# Patient Record
Sex: Female | Born: 1940
Health system: Southern US, Community
[De-identification: ages and names within clinical notes are randomized; demographics above are authoritative.]

## PROBLEM LIST (undated history)

## (undated) DIAGNOSIS — K219 Gastro-esophageal reflux disease without esophagitis: Secondary | ICD-10-CM

## (undated) DIAGNOSIS — G43909 Migraine, unspecified, not intractable, without status migrainosus: Secondary | ICD-10-CM

## (undated) DIAGNOSIS — I10 Essential (primary) hypertension: Secondary | ICD-10-CM

## (undated) DIAGNOSIS — Z8489 Family history of other specified conditions: Secondary | ICD-10-CM

## (undated) DIAGNOSIS — G4700 Insomnia, unspecified: Secondary | ICD-10-CM

## (undated) DIAGNOSIS — R0600 Dyspnea, unspecified: Secondary | ICD-10-CM

## (undated) DIAGNOSIS — I2510 Atherosclerotic heart disease of native coronary artery without angina pectoris: Secondary | ICD-10-CM

## (undated) DIAGNOSIS — R569 Unspecified convulsions: Secondary | ICD-10-CM

## (undated) DIAGNOSIS — R51 Headache: Secondary | ICD-10-CM

## (undated) DIAGNOSIS — G8929 Other chronic pain: Secondary | ICD-10-CM

## (undated) DIAGNOSIS — E785 Hyperlipidemia, unspecified: Secondary | ICD-10-CM

## (undated) DIAGNOSIS — R200 Anesthesia of skin: Secondary | ICD-10-CM

## (undated) DIAGNOSIS — I639 Cerebral infarction, unspecified: Secondary | ICD-10-CM

## (undated) DIAGNOSIS — R202 Paresthesia of skin: Secondary | ICD-10-CM

## (undated) DIAGNOSIS — G459 Transient cerebral ischemic attack, unspecified: Secondary | ICD-10-CM

## (undated) DIAGNOSIS — M1990 Unspecified osteoarthritis, unspecified site: Secondary | ICD-10-CM

## (undated) DIAGNOSIS — M549 Dorsalgia, unspecified: Secondary | ICD-10-CM

## (undated) DIAGNOSIS — I219 Acute myocardial infarction, unspecified: Secondary | ICD-10-CM

## (undated) DIAGNOSIS — E119 Type 2 diabetes mellitus without complications: Secondary | ICD-10-CM

## (undated) DIAGNOSIS — K449 Diaphragmatic hernia without obstruction or gangrene: Secondary | ICD-10-CM

## (undated) HISTORY — DX: Type 2 diabetes mellitus without complications: E11.9

## (undated) HISTORY — DX: Other chronic pain: G89.29

## (undated) HISTORY — PX: TOTAL ABDOMINAL HYSTERECTOMY: SHX209

## (undated) HISTORY — DX: Headache: R51

## (undated) HISTORY — DX: Dorsalgia, unspecified: M54.9

## (undated) HISTORY — DX: Acute myocardial infarction, unspecified: I21.9

## (undated) HISTORY — PX: CARDIAC CATHETERIZATION: SHX172

## (undated) HISTORY — DX: Transient cerebral ischemic attack, unspecified: G45.9

## (undated) HISTORY — DX: Insomnia, unspecified: G47.00

## (undated) HISTORY — PX: ANTERIOR AND POSTERIOR VAGINAL REPAIR: SUR5

## (undated) HISTORY — DX: Diaphragmatic hernia without obstruction or gangrene: K44.9

## (undated) HISTORY — DX: Hyperlipidemia, unspecified: E78.5

## (undated) HISTORY — DX: Migraine, unspecified, not intractable, without status migrainosus: G43.909

## (undated) HISTORY — PX: SQUAMOUS CELL CARCINOMA EXCISION: SHX2433

## (undated) HISTORY — PX: TONSILLECTOMY: SUR1361

## (undated) HISTORY — DX: Essential (primary) hypertension: I10

---
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
## 1981-07-13 HISTORY — PX: OTHER SURGICAL HISTORY: SHX169

## 1982-07-13 HISTORY — PX: OTHER SURGICAL HISTORY: SHX169

## 2001-06-25 ENCOUNTER — Emergency Department (HOSPITAL_COMMUNITY): Admission: EM | Admit: 2001-06-25 | Discharge: 2001-06-25 | Payer: Self-pay | Admitting: Emergency Medicine

## 2001-06-25 ENCOUNTER — Encounter: Payer: Self-pay | Admitting: Family Medicine

## 2003-07-14 DIAGNOSIS — I219 Acute myocardial infarction, unspecified: Secondary | ICD-10-CM

## 2003-07-14 HISTORY — DX: Acute myocardial infarction, unspecified: I21.9

## 2003-07-27 ENCOUNTER — Inpatient Hospital Stay (HOSPITAL_COMMUNITY): Admission: AD | Admit: 2003-07-27 | Discharge: 2003-07-31 | Payer: Self-pay | Admitting: Internal Medicine

## 2003-10-24 ENCOUNTER — Inpatient Hospital Stay (HOSPITAL_COMMUNITY): Admission: EM | Admit: 2003-10-24 | Discharge: 2003-10-26 | Payer: Self-pay | Admitting: Emergency Medicine

## 2004-02-15 ENCOUNTER — Inpatient Hospital Stay (HOSPITAL_COMMUNITY): Admission: AD | Admit: 2004-02-15 | Discharge: 2004-02-18 | Payer: Self-pay | Admitting: Cardiology

## 2004-05-01 ENCOUNTER — Ambulatory Visit (HOSPITAL_COMMUNITY): Admission: RE | Admit: 2004-05-01 | Discharge: 2004-05-01 | Payer: Self-pay | Admitting: Gastroenterology

## 2004-05-19 ENCOUNTER — Encounter: Admission: RE | Admit: 2004-05-19 | Discharge: 2004-05-19 | Payer: Self-pay | Admitting: Family Medicine

## 2004-06-11 ENCOUNTER — Ambulatory Visit: Payer: Self-pay | Admitting: Gastroenterology

## 2004-07-26 ENCOUNTER — Observation Stay (HOSPITAL_COMMUNITY): Admission: EM | Admit: 2004-07-26 | Discharge: 2004-07-28 | Payer: Self-pay | Admitting: Internal Medicine

## 2004-07-26 ENCOUNTER — Ambulatory Visit: Payer: Self-pay | Admitting: Cardiology

## 2004-07-28 ENCOUNTER — Ambulatory Visit: Payer: Self-pay

## 2004-08-14 ENCOUNTER — Ambulatory Visit: Payer: Self-pay | Admitting: Cardiology

## 2004-11-21 ENCOUNTER — Ambulatory Visit: Payer: Self-pay | Admitting: Cardiology

## 2004-11-27 ENCOUNTER — Ambulatory Visit: Payer: Self-pay | Admitting: Cardiology

## 2005-02-09 ENCOUNTER — Inpatient Hospital Stay (HOSPITAL_COMMUNITY): Admission: EM | Admit: 2005-02-09 | Discharge: 2005-02-12 | Payer: Self-pay | Admitting: Emergency Medicine

## 2005-02-09 ENCOUNTER — Ambulatory Visit: Payer: Self-pay | Admitting: Cardiovascular Disease

## 2005-02-11 ENCOUNTER — Ambulatory Visit: Payer: Self-pay | Admitting: Gastroenterology

## 2005-02-16 ENCOUNTER — Ambulatory Visit: Payer: Self-pay

## 2005-02-18 ENCOUNTER — Ambulatory Visit: Payer: Self-pay | Admitting: Cardiology

## 2005-02-20 ENCOUNTER — Ambulatory Visit: Payer: Self-pay | Admitting: Internal Medicine

## 2005-03-05 ENCOUNTER — Ambulatory Visit (HOSPITAL_COMMUNITY): Admission: RE | Admit: 2005-03-05 | Discharge: 2005-03-05 | Payer: Self-pay | Admitting: Gastroenterology

## 2005-03-11 ENCOUNTER — Ambulatory Visit: Payer: Self-pay | Admitting: Cardiology

## 2005-09-18 ENCOUNTER — Ambulatory Visit: Payer: Self-pay | Admitting: Cardiology

## 2005-09-18 ENCOUNTER — Inpatient Hospital Stay (HOSPITAL_COMMUNITY): Admission: EM | Admit: 2005-09-18 | Discharge: 2005-09-21 | Payer: Self-pay | Admitting: Emergency Medicine

## 2005-10-28 ENCOUNTER — Ambulatory Visit: Payer: Self-pay | Admitting: Cardiology

## 2006-08-22 ENCOUNTER — Ambulatory Visit: Payer: Self-pay | Admitting: Cardiology

## 2006-08-22 HISTORY — PX: CARDIAC CATHETERIZATION: SHX172

## 2006-08-23 ENCOUNTER — Inpatient Hospital Stay (HOSPITAL_COMMUNITY): Admission: EM | Admit: 2006-08-23 | Discharge: 2006-08-24 | Payer: Self-pay | Admitting: Emergency Medicine

## 2006-09-15 ENCOUNTER — Ambulatory Visit: Payer: Self-pay | Admitting: Cardiology

## 2007-03-16 ENCOUNTER — Ambulatory Visit: Payer: Self-pay | Admitting: Cardiology

## 2007-06-08 ENCOUNTER — Encounter: Admission: RE | Admit: 2007-06-08 | Discharge: 2007-06-08 | Payer: Self-pay | Admitting: Internal Medicine

## 2007-06-17 ENCOUNTER — Ambulatory Visit: Payer: Self-pay | Admitting: Cardiology

## 2007-06-22 ENCOUNTER — Ambulatory Visit: Payer: Self-pay | Admitting: Cardiology

## 2007-06-27 ENCOUNTER — Ambulatory Visit: Payer: Self-pay | Admitting: Cardiology

## 2008-12-03 HISTORY — PX: CYSTOCELE REPAIR: SHX163

## 2010-07-30 LAB — CBC
HCT: 40.1 % (ref 36.0–46.0)
Hemoglobin: 13.5 g/dL (ref 12.0–15.0)
MCH: 30.5 pg (ref 26.0–34.0)
MCHC: 33.7 g/dL (ref 30.0–36.0)
MCV: 90.7 fL (ref 78.0–100.0)
Platelets: 184 10*3/uL (ref 150–400)
RBC: 4.42 MIL/uL (ref 3.87–5.11)
RDW: 12.6 % (ref 11.5–15.5)
WBC: 4.6 10*3/uL (ref 4.0–10.5)

## 2010-07-30 LAB — BASIC METABOLIC PANEL
BUN: 11 mg/dL (ref 6–23)
CO2: 24 mEq/L (ref 19–32)
Calcium: 9.4 mg/dL (ref 8.4–10.5)
Chloride: 104 mEq/L (ref 96–112)
Creatinine, Ser: 0.8 mg/dL (ref 0.4–1.2)
GFR calc Af Amer: >60 mL/min (ref >60)
GFR calc non Af Amer: >60 mL/min (ref >60)
Glucose, Bld: 173 mg/dL — ABNORMAL HIGH (ref 70–99)
Potassium: 4.5 mEq/L (ref 3.5–5.1)
Sodium: 138 mEq/L (ref 135–145)

## 2010-07-30 LAB — SURGICAL PCR SCREEN
MRSA, PCR: NEGATIVE
Staphylococcus aureus: NEGATIVE

## 2010-07-31 ENCOUNTER — Inpatient Hospital Stay (HOSPITAL_COMMUNITY)
Admission: RE | Admit: 2010-07-31 | Discharge: 2010-07-31 | Payer: Self-pay | Source: Home / Self Care | Attending: Neurosurgery | Admitting: Neurosurgery

## 2010-07-31 HISTORY — PX: BACK SURGERY: SHX140

## 2010-08-03 ENCOUNTER — Encounter: Payer: Self-pay | Admitting: Gastroenterology

## 2010-08-04 LAB — GLUCOSE, CAPILLARY
Glucose-Capillary: 149 mg/dL — ABNORMAL HIGH (ref 70–99)
Glucose-Capillary: 154 mg/dL — ABNORMAL HIGH (ref 70–99)
Glucose-Capillary: 198 mg/dL — ABNORMAL HIGH (ref 70–99)
Glucose-Capillary: 210 mg/dL — ABNORMAL HIGH (ref 70–99)

## 2010-08-07 NOTE — Op Note (Addendum)
[name], [name]              ACCOUNT NO.:  [id number]  MEDICAL RECORD NO.:  [id number]          PATIENT TYPE:  INP  LOCATION:  2899                         FACILITY:  [facility]  PHYSICIAN:  Danae Orleans. Venetia Maxon, M.D.  DATE OF BIRTH:  1940-08-26  DATE OF PROCEDURE:  07/31/2010 DATE OF DISCHARGE:                              OPERATIVE REPORT   PREOPERATIVE DIAGNOSIS:  Left L5-S1 herniated lumbar disk with spondylosis, degenerative disk disease, and radiculopathy.  POSTOPERATIVE DIAGNOSIS:  Left L5-S1 herniated lumbar disk with spondylosis, degenerative disk disease, and radiculopathy.  PROCEDURE:  Left L5-S1 microdiskectomy with microdissection.  SURGEON:  Danae Orleans. Venetia Maxon, MD  ASSISTANTS: 1. Georgiann Cocker, RN 2. Clydene Fake, MD  ANESTHESIA:  General endotracheal anesthesia.  ESTIMATED BLOOD LOSS:  Minimal.  COMPLICATIONS:  None.  DISPOSITION:  Recovery.  INDICATIONS:  [name] is a 70 year old woman with severe left leg pain.  She has a large disk herniation at L5-S1 on the left.  It was elected to take her to surgery for microdiskectomy.  PROCEDURE IN DETAIL:  [name] was brought to the operating room. Following satisfactory and uncomplicated induction of general endotracheal anesthesia and placement of intravenous lines, the patient was placed in a prone position on the Wilson frame.  Her low back was prepped and draped in the usual sterile fashion.  The area of planned incision was infiltrated with local lidocaine.  Incision was made in the midline overlying the L5-S1 interspace, carried to the lumbodorsal fascia which was incised sharply on the left side of the midline. Subperiosteal dissection was performed exposing the L5-S1 interlaminar space.  A marker probe was placed and intraoperative x-ray confirmed correct orientation.  A hemi-semi-laminectomy of L5 was performed with high-speed drill and completed with Kerrison rongeurs and a  foraminotomy overlying the superior aspect of the sacrum to decompress the left S1 nerve root was also performed.  The microscope was brought into field and the ligamentum flavum was detached and removed in a piecemeal fashion.  The S1 nerve root was under significant pressure because of a large spondylitic disk herniation with a free fragment overlying that. Using careful microdissection technique, the S1 nerve root was mobilized medially.  The fragment of herniated disk material was removed.  The spondylitic ridge was also taken down.  The interspace was cleared of residual disk material as both medially and laterally and it was felt at this point that significant decompression of the S1 nerve root had been achieved.  There was no evidence of any compression of the L5 nerve root.  Hemostasis was assured.  The wound was irrigated, bathed in Depo- Medrol and fentanyl.  The self-retaining retractor was removed.  The lumbodorsal fascia was closed with 0 Vicryl sutures, subcutaneous tissues were reapproximated with 2-0 Vicryl interrupted inverted sutures, and skin edges were reapproximated with 3-0 Vicryl subcuticular stitch.  The wound was dressed with Dermabond.  The patient was extubated in the operating room and taken to the recovery in stable and satisfactory condition having tolerated the operation well.  Counts were correct at the end of the case.     Danae Orleans. Venetia Maxon,  M.D.     JDS/MEDQ  D:  07/31/2010  T:  07/31/2010  Job:  [id number]  Electronically Signed by Maeola Harman M.D. on 08/07/2010 10:14:13 AM

## 2010-09-02 NOTE — Discharge Summary (Signed)
  [name], HUSBAND              ACCOUNT NO.:  [id number]  MEDICAL RECORD NO.:  [id number]          PATIENT TYPE:  INP  LOCATION:  3536                         FACILITY:  [facility]  PHYSICIAN:  Danae Orleans. Venetia Maxon, M.D.  DATE OF BIRTH:  1941/01/12  DATE OF ADMISSION:  07/31/2010 DATE OF DISCHARGE:  07/31/2010                              DISCHARGE SUMMARY   REASON FOR ADMISSION:  [name] is a 70 year old woman with severe left leg pain.  She has a large disk herniation at L5-S1 of the left. It was elected to take her to surgery for left L5-S1 microdiskectomy. Postoperatively, the patient did well and was mobilizing without difficulty and was discharge home later on August 31, 2010, and having tolerated hospitalization and surgery without difficulty.  She was sent home with her preoperative medications are pravastatin 40 mg daily, aspirin 81 mg daily, vitamin D3 3000 international units daily, Protonix 40 mg daily, glimepiride 4 mg twice daily, hydrocodone/APAP 5/325 mg every 6 hours as needed, metformin 1000 mg 1 to 1.5 twice daily, and metoprolol tartrate 100 mg twice daily.  She was instructed to follow up in my office for postoperative visit in 3 weeks postoperatively.  She was discharged having tolerated her surgery and hospitalization without difficulty.     Danae Orleans. Venetia Maxon, M.D.     JDS/MEDQ  D:  09/01/2010  T:  09/01/2010  Job:  [id number]  Electronically Signed by Maeola Harman M.D. on 09/02/2010 04:53:10 PM

## 2010-10-30 ENCOUNTER — Inpatient Hospital Stay (HOSPITAL_COMMUNITY)
Admission: RE | Admit: 2010-10-30 | Discharge: 2010-10-31 | DRG: 491 | Disposition: A | Discharge status: Home / Self Care | Payer: Medicare Other | Source: Ambulatory Visit | Attending: Neurosurgery | Admitting: Neurosurgery

## 2010-10-30 ENCOUNTER — Inpatient Hospital Stay (HOSPITAL_COMMUNITY): Payer: Medicare Other

## 2010-10-30 DIAGNOSIS — I2510 Atherosclerotic heart disease of native coronary artery without angina pectoris: Secondary | ICD-10-CM | POA: Diagnosis present

## 2010-10-30 DIAGNOSIS — M5137 Other intervertebral disc degeneration, lumbosacral region: Secondary | ICD-10-CM | POA: Diagnosis present

## 2010-10-30 DIAGNOSIS — Z79899 Other long term (current) drug therapy: Secondary | ICD-10-CM

## 2010-10-30 DIAGNOSIS — Z7982 Long term (current) use of aspirin: Secondary | ICD-10-CM

## 2010-10-30 DIAGNOSIS — Z9861 Coronary angioplasty status: Secondary | ICD-10-CM

## 2010-10-30 DIAGNOSIS — M129 Arthropathy, unspecified: Secondary | ICD-10-CM | POA: Diagnosis present

## 2010-10-30 DIAGNOSIS — E119 Type 2 diabetes mellitus without complications: Secondary | ICD-10-CM | POA: Diagnosis present

## 2010-10-30 DIAGNOSIS — M5126 Other intervertebral disc displacement, lumbar region: Principal | ICD-10-CM | POA: Diagnosis present

## 2010-10-30 DIAGNOSIS — I10 Essential (primary) hypertension: Secondary | ICD-10-CM | POA: Diagnosis present

## 2010-10-30 DIAGNOSIS — K219 Gastro-esophageal reflux disease without esophagitis: Secondary | ICD-10-CM | POA: Diagnosis present

## 2010-10-30 DIAGNOSIS — M51379 Other intervertebral disc degeneration, lumbosacral region without mention of lumbar back pain or lower extremity pain: Secondary | ICD-10-CM | POA: Diagnosis present

## 2010-10-30 DIAGNOSIS — I252 Old myocardial infarction: Secondary | ICD-10-CM

## 2010-10-30 DIAGNOSIS — M47817 Spondylosis without myelopathy or radiculopathy, lumbosacral region: Secondary | ICD-10-CM | POA: Diagnosis present

## 2010-10-30 LAB — GLUCOSE, CAPILLARY
Glucose-Capillary: 64 mg/dL — ABNORMAL LOW (ref 70–99)
Glucose-Capillary: 124 mg/dL — ABNORMAL HIGH (ref 70–99)
Glucose-Capillary: 144 mg/dL — ABNORMAL HIGH (ref 70–99)
Glucose-Capillary: 49 mg/dL — ABNORMAL LOW (ref 70–99)
Glucose-Capillary: 128 mg/dL — ABNORMAL HIGH (ref 70–99)

## 2010-10-30 LAB — CBC
HCT: 38.6 % (ref 36.0–46.0)
Hemoglobin: 13.2 g/dL (ref 12.0–15.0)
MCH: 31.2 pg (ref 26.0–34.0)
MCHC: 34.2 g/dL (ref 30.0–36.0)
MCV: 91.3 fL (ref 78.0–100.0)
Platelets: 212 10*3/uL (ref 150–400)
RBC: 4.23 MIL/uL (ref 3.87–5.11)
RDW: 13.1 % (ref 11.5–15.5)
WBC: 4.4 10*3/uL (ref 4.0–10.5)

## 2010-10-30 LAB — BASIC METABOLIC PANEL
BUN: 10 mg/dL (ref 6–23)
CO2: 27 mEq/L (ref 19–32)
Calcium: 9.7 mg/dL (ref 8.4–10.5)
Chloride: 103 mEq/L (ref 96–112)
Creatinine, Ser: 0.62 mg/dL (ref 0.4–1.2)
GFR calc Af Amer: >60 mL/min (ref >60)
GFR calc non Af Amer: >60 mL/min (ref >60)
Glucose, Bld: 66 mg/dL — ABNORMAL LOW (ref 70–99)
Potassium: 4.4 mEq/L (ref 3.5–5.1)
Sodium: 137 mEq/L (ref 135–145)

## 2010-10-30 LAB — SURGICAL PCR SCREEN
MRSA, PCR: NEGATIVE
Staphylococcus aureus: NEGATIVE

## 2010-10-31 LAB — GLUCOSE, CAPILLARY
Glucose-Capillary: 295 mg/dL — ABNORMAL HIGH (ref 70–99)
Glucose-Capillary: 305 mg/dL — ABNORMAL HIGH (ref 70–99)
Glucose-Capillary: 381 mg/dL — ABNORMAL HIGH (ref 70–99)

## 2010-11-04 NOTE — Op Note (Signed)
[name], [name]              ACCOUNT NO.:  [id number]  MEDICAL RECORD NO.:  [id number]           PATIENT TYPE:  I  LOCATION:  3040                         FACILITY:  [facility]  PHYSICIAN:  Danae Orleans. Venetia Maxon, M.D.  DATE OF BIRTH:  Dec 20, 1940  DATE OF PROCEDURE:  10/30/2010 DATE OF DISCHARGE:                              OPERATIVE REPORT   PREOPERATIVE DIAGNOSES:  Recurrent herniated lumbar disk L5-S1, left, with spondylosis, degenerative disease, and radiculopathy.  POSTOPERATIVE DIAGNOSES:  Recurrent herniated lumbar disk L5-S1, left, with spondylosis, degenerative disease, and radiculopathy.  PROCEDURES: 1. Redo left L5-S1 microdiskectomy. 2. Microdissection.  SURGEON:  Danae Orleans. Venetia Maxon, MD  ASSISTANT:  Coletta Memos, MD  ANESTHESIA:  General endotracheal anesthesia.  ESTIMATED BLOOD LOSS:  Minimal.  COMPLICATIONS:  None.  DISPOSITION:  Recovery.  INDICATIONS:  [name] is a 70 year old woman who 2 months agounderwent a left L5-S1 microdiskectomy.  She had developed a relatively acute recurrent disk herniation with significant amount of leg pain and has a large recurrent disk fragment.  It was elected to take her to surgery for redo microdiskectomy.  DESCRIPTION OF PROCEDURE:  [name] was brought to the operating room.  Following satisfactory and an uncomplicated induction of general endotracheal anesthesia, and placement of intravenous lines, the patient was placed in prone position on the Wilson frame.  Her low back was prepped and draped in the usual sterile fashion.  The area of planned incision was infiltrated with local lidocaine.  Incision was made in the midline through previous incision, carried to the lumbodorsal fascia which was incised on the left side in the midline.  Subperiosteal dissection was performed exposing the old laminotomy defect.  An intraoperative x-ray confirmed correct orientation.  Using microdissection technique, the neural  elements were mobilized medially. There was some fairly scarred in piece of disk material which was causing significant compression of the left S1 nerve root.  Using microdissection technique, the S1 nerve root was mobilized medially and large amounts of herniated disk material were removed.  The interspace was further evacuated of residual disk material, both medially and laterally, and several pieces of endplate which had been herniated into the part of the disk herniation causing significant nerve root compression.  After doing so, it was felt that the neural elements were well decompressed.  There did not appear to be residual disk material. The interspace was irrigated.  No evidence of residual herniated disk material within the interspace.  The wound was bathed in Depo-Medrol and fentanyl.  The lumbodorsal fascia was closed with 0 Vicryl sutures, subcutaneous tissues were approximated with 2-0 Vicryl interrupted inverted sutures.  The skin edges were approximated with interrupted 3-0 Vicryl subcu stitch.  Wound was dressed with Dermabond.  The patient was extubated in the operating room, taken to the recovery room in stable, satisfactory condition, having tolerated the operation well.  Counts were correct at the end of the case.     Danae Orleans. Venetia Maxon, M.D.     JDS/MEDQ  D:  10/30/2010  T:  10/31/2010  Job:  [id number]  Electronically Signed by Maeola Harman M.D. on 11/04/2010 07:38:48 AM

## 2010-11-25 NOTE — Assessment & Plan Note (Signed)
[facility]                          [facility] NOTE   NAME:Schimming, DENNETTE FAULCONER                     MRN:          [id number]  DATE:03/16/2007                            DOB:          [birth year]    HISTORY OF PRESENT ILLNESS:  The patient is a 70 year old female with a  history of coronary artery disease.  The patient is status post a stent  placement to the LAD in 2005.  She has been doing well.  She reports no  recurrent substernal chest pain.  She has no shortness of breath,  orthopnea or PND.  Ejection fraction was normal on previous  catheterization.  The patient presents for routine follow-up and also  needs prescription refills.  The patient has requested a change to  generic medications.   MEDICATIONS:  1. Lisinopril 10 mg p.o. daily.  2. Plavix 75 mg p.o. daily.  3. Actos 30 mg p.o. daily.  4. Janumet 50/1000 mg p.o. b.i.d.  5. Pravachol 40 mg p.o. q.h.s.  6. Aspirin 325 mg daily.  7. Multivitamin.   PHYSICAL EXAMINATION:  VITAL SIGNS:  Blood pressure is 120/80, heart  rate is 65 beats per minute.  NECK:  Normal carotid upstroke, no carotid bruits.  LUNGS:  Clear breath sounds bilaterally.  HEART:  Regular rate and rhythm, normal S1-S2.  No murmurs, rubs or  gallops.  ABDOMEN:  Soft, nontender.  No rebound throughout and good bowel sounds.  EXTREMITIES:  No cyanosis, clubbing or edema.   PROBLEM LIST:  1. Coronary artery disease, status post Taxus stent to the left      anterior descending artery.  2. Irritable bowel syndrome.  3. Diabetes.  4. Hypertension.  5. Dyslipidemia.   PLAN:  1. The patient was given new prescription refills.  She was also      changed to metoprolol half a tablet twice a day and lisinopril half      a tablet daily (10 mg and metoprolol at 25 mg p.o. b.i.d.).  Zantac      was prescribed at 300 mg p.o. q.h.s. and nitroglycerin p.r.n.  2. The patient can follow up with [place] in one year.  She has no  active      cardiac problems.    Learta Codding, MD,FACC  Electronically Signed   GED/MedQ  DD: 03/17/2007  DT: 03/17/2007  Job #: [id number]

## 2010-11-25 NOTE — Assessment & Plan Note (Signed)
[facility]                          [facility] NOTE   NAME:Birenbaum, JENNYE RUNQUIST                     MRN:          [id number]  DATE:06/17/2007                            DOB:          [birth year]    CARDIOLOGIST:  Dr. Lewayne Bunting.   PRIMARY CARE PHYSICIAN:  Dr. Pearson Grippe.   REASON FOR VISIT:  Chest pain.   HISTORY OF PRESENT ILLNESS:  [name] is a 70 year old female patient  with a history of coronary artery disease, status post prior stenting of  her LAD with a TAXUS drug-eluting stent in the setting of anterior  myocardial infarction January 2005.  She last underwent cardiac  catheterization in February 2008 in response to unstable angina  pectoris.  At that time she had a widely patent stent in the LAD and  significant disease in the RCA with serial 70% lesions proximally,  distal lesion of 80%.  This was treated with Endeavor drug-eluting  stents x2 to the RCA.   Since her last percutaneous coronary intervention the patient has done  well without chest pain or shortness of breath.  Over the last 3 days  she has noted a recurrence of chest discomfort.  This is at rest.  It is  located on the left side.  It is a pressure or dull-type pain.  There is  some radiation to her left arm.  She denies associated shortness of  breath but has had some nausea and diaphoresis.  She has also noted some  right-sided chest discomfort, she attributes this to stress.  Her  husband has a lot of medical issues including depression.  She also  recently had her Aciphex switched to Zantac secondary to financial  constraints.  She also thinks her symptoms may be related to her acid  reflux disease.  However, on further questioning she does note that her  chest discomfort is similar to her previous angina.  This episode is not  as bad as previously when she underwent PCI.  She did take nitroglycerin  on 2 occasions with prompt relief.  She has not had any  chest pain in  the last 24 hours.   CURRENT MEDICATIONS:  1. Lisinopril 10 mg half a tablet daily.  2. Aspirin 81 mg a day.  3. Red yeast rice.  4. Ranitidine 150 mg daily.  5. Plavix 75 mg daily.  6. Lopressor 50 mg b.i.d.  7. Actos 30 mg a day.  8. Janumet 50/1000 mg b.i.d.  9. Nitroglycerin p.r.n. chest pain.   ALLERGIES:  CODEINE, SHE IS INTOLERANT TO ALL STATINS.   SOCIAL HISTORY:  She denies tobacco abuse.   FAMILY HISTORY:  Significant for CAD.   REVIEW OF SYSTEMS:  Please see HPI.  She does note some dysphagia with  foods.  This began when she stopped her Aciphex.  She denies melena,  hematochezia, hematuria, dysuria.  Rest of the review of systems are  negative.   PHYSICAL EXAMINATION:  She is a well-nourished, well-developed female in  no distress.  Blood pressure 141/77, pulse 67, weight 197.6 pounds.  HEENT:  Normal.  NECK:  Without JVD.  CARDIAC:  Normal S1-S2, regular rate and rhythm without murmurs.  LUNGS:  Clear to auscultation bilaterally without wheezing, rhonchi or  rales.  ABDOMEN:  Soft, nontender with normoactive bowel sounds, no  organomegaly.  EXTREMITIES:  Without edema.  NEUROLOGIC:  She is alert and oriented x3, cranial nerves II-XII are  grossly intact.  VASCULAR:  Without carotid bruits bilaterally.  Femoral artery pulses 2+  bilaterally without bruits.   Electrocardiogram reveals sinus rhythm with a heart rate of 61, normal  axis, no acute changes.   IMPRESSION:  1. Chest pain.  2. Coronary artery disease.      a.     Status post TAXUS stenting to the left anterior descending       in the setting of anterior myocardial infarction in 2005.      b.     Status post Endeavor drug-eluting stents to the right       coronary artery x2, February 2008 secondary to unstable angina.  3. Preserved left ventricular function with an ejection fraction of      60% at her previous cardiac catheterization.  4. Gastroesophageal reflux disease.      a.      Dysphagia.  5. Diabetes mellitus.  6. Hypertension.  7. Hyperlipidemia.  8. History of irritable bowel syndrome.   PLAN:  Ms. Neuhaus presents to the office today for further evaluation  of chest pain.  This is very suggestive of her previous episodes of  angina.  I had a long discussion with her today regarding proceeding  with elective cardiac catheterization.  However, the patient notes that  she is under quite a bit of stress with her husband and fears that going  to the hospital for even an outpatient elective cardiac catheterization  would send him over the edge.  She is however agreeable to proceed  with stress Cardiolite testing.  We will set that up in the next few  days.  I have also placed her on Imdur 30 mg a day.  She would like to  start back on a proton pump inhibitor, therefore we will place her on  Protonix 40 mg a day.  She knows to go to the  emergency room if she has a change in her chest pain or worsening  symptoms.  Otherwise, we will see her back in the next 1-2 weeks after  her stress test.      Tereso Newcomer, PA-C  Electronically Signed      Learta Codding, MD,FACC  Electronically Signed   SW/MedQ  DD: 06/17/2007  DT: 06/18/2007  Job #: [id number]   cc:   Massie Maroon, MD

## 2010-11-25 NOTE — Assessment & Plan Note (Signed)
[facility]                          [facility] NOTE   NAME:[name] [name]                     MRN:          [id number]  DATE:06/27/2007                            DOB:          [birth year]    [name] is here for cardiology followup.  She had been seen in the  office on June 17, 2007.  She had some chest discomfort.  She had  been under enormous stress.  After a very careful discussion, it was  decided that she should have a stress Cardiolite scan.  This was done on  June 22, 2007.  She exercised for 6 minutes and 30 seconds.  There  was no chest pain.  There was no significant EKG change.  It was felt  after complete review that she had probable normal LV perfusion and that  the overall study was normal with no definite ischemia.  There was  breast attenuation.  The ejection fraction was 67%.  Wall motion was  normal.   The patient has not had any major problems since then.  I have reviewed  the entire situation with her today.  She is tolerating the Imdur and  she is feeling well overall.   PAST MEDICAL HISTORY:   ALLERGIES:  CODEINE.   MEDICATIONS:  Aspirin, red yeast rice, Lisinopril, Protonix, and Imdur.   OTHER MEDICAL PROBLEMS:  See the list below.   REVIEW OF SYSTEMS:  Today she is feeling well.  She asked about the use  of red yeast rice.  We then had an extended discussion about cholesterol  medications.  See the discussion below.  Otherwise, her review of  systems is negative.   PHYSICAL EXAMINATION:  VITAL SIGNS:  Blood pressure is 135/84 with a  pulse of 58.  GENERAL:  The patient is oriented to person, time, and place.  Affect is  normal.  HEENT:  Reveals no xanthelasma.  She has normal extraocular motion.  NECK:  There are no carotid bruits.  There is no jugular venous  distention.  CARDIAC:  Reveals an S1 with an S2.  There are no clicks or significant  murmurs.  EXTREMITIES:  She has no significant  peripheral edema.   The nuclear scan was done.  See the discussion above.   PROBLEMS:  1. Recent chest pain, stable.  No further workup.  2. Coronary disease plus a TAXUS stent to the left anterior descending      artery in 2005, and a drug-eluting Endeavor stent to the right      coronary artery in February 2008.  3. Gastroesophageal reflux disease with some dysphagia.  4. Diabetes.  5. Hypertension.  6. Hyperlipidemia.  7. History of irritable bowel.  8. Difficulty with Statins in the past.   The patient is stable.  She will see Dr. Andee [name] back in 3 months.  The  patient and I had a long discussion about the approach to Statins.  I  explained to her that there were advantages of this drug class for  people with coronary disease that were in addition to the actual  cholesterol-lowing  effect.  She had asked me why she would need one if  her cholesterol was normal.  I did not push for the addition of any  drugs.  She said that she had been on some Pravastatin and that she has  discussed this with Dr. Andee [name] and ultimately she is on red yeast rice.  There is no absolute answer, however, I encouraged her that the  guidelines support the fact that it is good to be on a Statin type of  medicine with proven coronary disease.   For now, there will be no change in her meds and she will see Dr. Andee [name]  back in 3 months.     Haley Abed, MD, [facility]  Electronically Signed    JDK/MedQ  DD: 06/27/2007  DT: 06/27/2007  Job #: [id number]   cc:   Haley Maroon, MD

## 2010-11-28 NOTE — H&P (Signed)
NAME:  [name], [name]                        ACCOUNT NO.:  [id number]   MEDICAL RECORD NO.:  [id number]                   PATIENT TYPE:  INP   LOCATION:  4733                                 FACILITY:  [facility]   PHYSICIAN:  Pricilla Riffle, M.D.                 DATE OF BIRTH:  09/23/1940   DATE OF ADMISSION:  07/27/2003  DATE OF DISCHARGE:                                HISTORY & PHYSICAL   IDENTIFICATION:  [name] is a 70 year old woman with no known CAD who  presents for evaluation of chest pain.   HISTORY OF PRESENT ILLNESS:  The patient noticed on Sunday evening when she  was lying down, developed substernal chest pressure, not like her previous  reflux.  It got a little better sitting up but persisted.  EMS was notified.  Came.  Per their report, EKG was normal.  Patient said by the time they  left, though she was feeling better, though the chest pain occurred after  they left.  They had asked her to go to the emergency room, but she did not  want to.  On Monday, she was tired all day.  Still hurt intermittently, a  lot actually, in her chest.  Tuesday again chest pressure but a little bit  better but still short of breath.  Wednesday noted one episode, particularly  when she was taking out the garbage when she developed chest pressure, could  not breathe.  She went to church on Wednesday night and just could not get  comfortable.  After dinner had to leave secondary to discomfort.  Thursday  was seen by Dr. Dimple Casey, still hurting in her chest.  An EKG was done.  Patient  was referred to the emergency room but declined, preferred to come today.  Today she is currently without chest pressure.  Notes she is taking Tums and  other herbal GI drugs without relief.   Patient denies any palpitations.  No significant dizziness.   ALLERGIES:  CODEINE leads to nausea.   PAST MEDICAL HISTORY:  1. Diabetes x3 years.  2. Hypertension.  3. Obesity, status post gastric bypass x3.  4.  Hiatal hernia.  5. Peripheral neuropathy secondary to diabetes.   PAST SURGICAL HISTORY:  1. Gastric bypass x3.  2. Cholecystectomy.  3. Tummy tuck.  4. TAH.   SOCIAL HISTORY:  The patient does not smoke, does not drink.  Is married.  Has three kids.   FAMILY HISTORY:  Father died of an MI at age 30.  Mother is 36 with  hypertension and diabetes.  One sister is okay.   REVIEW OF SYSTEMS:  Occasional headaches.  GI:  Irritable bowel, reflux.  GYN:  G5, P3.  ENDOCRINE:  Diabetes.  Otherwise all systems are reviewed and  negative for the above problems, except as noted.   PHYSICAL EXAMINATION:  GENERAL:  Patient is in no acute distress.  VITAL SIGNS:  Blood pressure 130/80, pulse 80.  Weight 187.  NECK:  JVP is normal.  No bruits.  LUNGS:  Clear.  CARDIAC:  Regular rate and rhythm.  S1 and S2.  No S3 or S4.  No murmurs.  ABDOMEN:  No bruits.  Supple.  EXTREMITIES:  Distal pulses 2+.  No extremity edema.  No bruits.   A 12-lead EKG with sinus rhythm at a rate of 100 beats per minute.  Incomplete right bundle branch block.  ST elevation in V3-5 with coved ST/T  wave inversion, concerning for ischemic/injury.  This is new from April,  2004.   IMPRESSION:  Patient is a 70 year old woman with several risk factors for  coronary artery disease, now with a one-week history of chest pressure and  EKG changes that are worrisome for injury and ischemia.  I believe she may  have had an event during the past week.  I have discussed this with her.  Would begin heparin and aspirin.  Will refer her for cardiac  catheterization.  The risks and benefits described.  Patient understands and  agrees to proceed.                                                Pricilla Riffle, M.D.    PVR/MEDQ  D:  07/27/2003  T:  07/27/2003  Job:  [id number]   cc:   Magnus Sinning. Dimple Casey, M.D.  [address] [place]  [place] [address]  Fax: [phone]

## 2010-11-28 NOTE — Assessment & Plan Note (Signed)
[facility]                            CARDIOLOGY OFFICE NOTE   NAME:[name]                     MRN:          [id number]  DATE:09/15/2006                            DOB:          1941/02/15    PRIMARY CARE PHYSICIAN:  Dr. Morrie Sheldon.   REASON FOR PRESENTATION:  The patient is status post stenting of her  right coronary lesion.   HISTORY OF PRESENT ILLNESS:  The patient is a pleasant 70 year old who  presented with chest discomfort on August 22, 2006.  She subsequently  underwent a cardiac catheterization and was found to have the left main  normal, LAD had a stent that was widely patent, first diagonal rising  from the stented area was patent, ramus intermediate was normal,  circumflex was small with no significant disease.  The right coronary  artery was dominant.  There was a series of 70% proximal lesions.  The  distal portion of the vessel had an 80% stenosis.  The ejection fraction  was 60%.  The patient had two drug-eluding stents placed in the right  coronary artery.   Since going home she has had some bleeding chest discomfort, not like  that which brought her to the hospital.  She has had no substernal chest  pressure or neck discomfort.  She has had no palpitations, no pre-  syncope or syncope.  She has had no PND or orthopnea.  She is going to  start cardiac rehab at [facility] soon.   PAST MEDICAL/SURGICAL HISTORY:  1. Coronary artery disease (as above).  She had a myocardial      infarction in January 2005, with a Taxus stent to the LAD.  2. History of irritable bowel syndrome.  3. Diabetes.  4. Hypertension.  5. Hyperlipidemia.  6. Cholecystectomy.  7. Hysterectomy.  8. Gastric bypass.   ALLERGIES:  She has been intolerant of CODEINE, DARVON AND LIPITOR.   MEDICATIONS:  1. Plavix 75 mg daily.  2. Aciphex 20 mg daily.  3. Lopressor 50 mg b.i.d.  4. Actos 30 mg daily.  5. Janumet.  6. Pravachol 80 mg daily.  7. Aspirin 325 mg daily.   REVIEW OF SYSTEMS:  As stated in the HPI.  Otherwise negative for other  systems.   PHYSICAL EXAMINATION:  GENERAL:  The patient is in no distress.  VITAL SIGNS:  Blood pressure 140/78, heart rate 62 and regular, weight  199 pounds, body mass index 28.  HEENT:  Eyes unremarkable.  Pupils equal, round, reactive to light.  Fundi not visualized.  Oral mucosa unremarkable.  NECK:  No jugular venous distention.  Wave form within normal limits.  Carotid upstroke brisk and symmetrical.  No bruits, no thyromegaly.  LYMPHATICS:  No cervical, axillary or inguinal adenopathy.  LUNGS:  Clear to auscultation bilaterally.  BACK:  No costovertebral angle tenderness.  CHEST:  Unremarkable.  HEART:  PMI not displaced or sustained.  S1 and S2 within normal limits.  No S3, no S4, no clicks, rubs, no murmurs.  ABDOMEN:  Flat, positive bowel sounds, normal frequency and pitch.  No  bruits, no  rebound, no guarding.  No midline pulsation, no masses, no  organomegaly.  SKIN:  No rashes.  EXTREMITIES:  With 2+ pulses, no edema.   ASSESSMENT/PLAN:  1. Coronary artery disease:  The patient is not having any angina      currently.  She has some atypical chest pain.  We need to talk      through this.  She has a lot of anxiety about this.  Cardiac rehab      will be excellent for her.  At this point I will make no change in      her medical regimen and no further cardiovascular testing is      planned.  2. Hypertension:  Blood pressure is slightly elevated but has not been      otherwise.  She will continue on her medications as listed.   FOLLOWUP:  I will see her back in about three months, or sooner if  needed.     Rollene Rotunda, MD, [facility]  Electronically Signed    JH/MedQ  DD: 09/15/2006  DT: 09/15/2006  Job #: [id number]   cc:   Dr. Morrie Sheldon

## 2010-11-28 NOTE — Assessment & Plan Note (Signed)
[facility]                                 ON-CALL NOTE   NAME:[name], [name]                     MRN:          [id number]  DATE:09/02/2006                            DOB:          Sep 26, 1940    Supervising physician Dr. Dietrich Pates.  Primary cardiologist Dr. Antoine Poche.  Primary care physician [facility]   SUMMARY OF HISTORY:  I received a page from Paulita Cradle, FNP with  [facility], stating that [name] was in  her office, that she had been having these fleeting sharp chest pains  throughout the day.  Her EKG appeared to be unchanged.  She asked me  what I wished to do with her.  I explained to Ms. Bevelyn Buckles that I could  not answer that question, but that if she felt that she needed to seen  then I would be happy to see her in the emergency room at [facility],  since it was after hours.  Ms. Bevelyn Buckles also stated that her nurse had  spoken to Dr. Jenene Slicker nurse earlier with apparent instructions to  have the EKG faxed to the office.  However, I am not sure what  communication actually transpired, since Ms. Bevelyn Buckles could not relay  that information to me except for the fact that she did not know our fax  number.  She also stated that she had been trying to contact someone for  several hours.  I reassured her that this is the first page that I have  received from the answering service.   After hanging up with Ms. Bevelyn Buckles I contacted the emergency room and  left standing orders in regards to a chest x-ray, EKG and labs, and to  call me on her arrival.  However, by 7:40 the patient still had not  arrived to the hospital, thus I called Ms. Kubicki at home.  Ms. Tzeng  stated that Ms. Bevelyn Buckles had stated that I told her that it was up to  her as to if she could come to the emergency room.  I explained to her  that it was up to Ms. Bevelyn Buckles if she felt she needed to come to the  emergency  room, as that I had not met the patient before nor had I  currently evaluated her, but I also explained to Ms. Darlin Drop that I  informed Ms. Bevelyn Buckles that I would be happy to see her in the emergency  room.  I inquired of Ms. Buccieri if she had any further discomfort.  She  said no.  She also stated that she had talked to [place] earlier in the  day, and that Victorino Dike stated that they could possibly see her at 8:15  tomorrow.  Ms. Armas was unsure if that appointment had actually been  scheduled.  I informed Ms. Witherell if she wished to be seen she could  either proceed to the emergency room, we would be happy to see her, or  she could try to take a chance that she could be seen in the office  tomorrow.  Ms.  Dymek did not want to come to the emergency room as  that she had not had any further episodes of her atypical chest  discomfort, and she stated that she would see how the road conditions  were and make a decision in the morning.  I explained to Ms. Basham if  she continued to have any problems or questions to please call us back,  and if she did decide to  come to the emergency room please call us and let us know so we would  anticipate her arrival.  She agreed to this plan.      Joellyn Rued, PA-C  Electronically Signed      Gerrit Friends. Dietrich Pates, MD, [facility]  Electronically Signed   EW/MedQ  DD: 09/02/2006  DT: 09/03/2006  Job #: [id number]

## 2010-11-28 NOTE — H&P (Signed)
[name], [name]              ACCOUNT NO.:  [id number]   MEDICAL RECORD NO.:  [id number]          PATIENT TYPE:  INP   LOCATION:  3733                         FACILITY:  [facility]   PHYSICIAN:  Greggory Stallion L. Pernell Dupre, M.D.  DATE OF BIRTH:  1941/05/02   DATE OF ADMISSION:  07/26/2004  DATE OF DISCHARGE:                                HISTORY & PHYSICAL   CARDIOLOGIST:  Learta Codding, M.D. [facility].   TIME:  3:00 a.m.   CHIEF COMPLAINT:  Chest pain.   HISTORY OF PRESENT ILLNESS:  A 70 year old white female with history of CAD,  status post Taxus stent to the LAD, diabetes, hypertension, and  hyperlipidemia presents with chest pain.  The patient states approximately  one week ago she developed lateral left chest pain lasting a couple hours,  spontaneously relieved.  This a.m., started having midsternal chest pain and  left lateral chest pain with radiation to the left arm.  The patient  described it as dull, 7/10, lasting approximately 30 minutes, relieved with  one sublingual nitroglycerin.  Pain came on while sitting down.  Associated  symptoms are shortness of breath, no diaphoresis, no November.  At  approximately 7:00 p.m., had a recurrent bout of chest pain similar to the  a.m. episode, but 8-9/10, did not relieve with nitroglycerin.  The patient  states the pain felt like previous MI.  No PND, orthopnea, lower extremity  edema, or syncope.   ALLERGIES:  1.  __________.  2.  CODEINE.   MEDICATIONS:  1.  Aspirin 325 daily.  2.  Plavix 75 daily.  3.  Lipitor 20 daily.  4.  Lopressor 50 b.i.d.  5.  Protonix 40 daily.  6.  Avandia.   PAST MEDICAL HISTORY:  1.  CAD.  MI in January 2005, treated with a Taxus stent to 100% LAD.      Catheterization on February 18, 2004:  Left main was normal.  LAD less than      10% at the stent.  Left circumflex normal.  RCA 60% proximal, 60% mid-      right.  EF 60%.  2.  Diabetes with peripheral neuropathy.  3.  Hypertension.  4.  Dyslipidemia.  5.  Gastroesophageal reflux disease.  6.  Hiatal hernia.  7.  Irritable bowel syndrome.  8.  Cholecystectomy.  9.  Total hysterectomy.   SOCIAL HISTORY:  Lives with three children and husband.  Does not smoke,  drink, or do drugs.   FAMILY HISTORY:  Mother has hypertension and diabetes.  Father died at 35  from an MI.   REVIEW OF SYSTEMS:  Only remarkable for chest pain.   PHYSICAL EXAMINATION:  TEMPERATURE:  Afebrile at 98.2.  PULSE:  58.  BLOOD PRESSURE:  171/97.  RESPIRATORY RATE:  20.  SATURATION:  100% on room air.  GENERAL:  Alert and oriented x 3, in no apparent distress.  NECK:  No increased JVP, no carotid bruits.  Does have an enlarged thyroid  gland.  CARDIOVASCULAR:  A normal S1, split S2.  No murmurs, rubs, or gallops.  ABDOMEN:  Soft, nontender,  nondistended.  Positive bowel sounds.  EXTREMITIES:  No clubbing, cyanosis, or edema.  Strong radial, femoral, and  pedal pulses.   Chest x-ray:  Mild bilateral atelectasis.  No acute cardiopulmonary process.  EKG:  Rate 63.  Rhythm:  Normal sinus rhythm.  Axis 57, PR 178, QRS 84, QTc  427.  Q's significant in aVL.  ST-T wave changes:  Less than 1 mm ST  elevation in II, III, and aVF.  She has an incomplete right bundle branch  block and left atrial enlargement.   LABORATORIES:  H&H 14/42.  Sodium 138, potassium 4.1, BUN 13, creatinine  0.8, glucose 127.  Three sets of MBs are less than 1, 1.2, less than 1.  Troponin less than 0.05 x 3.   ASSESSMENT AND PLAN:  1.  Chest pain.  Patient with typical features of chest pain concerning for      unstable angina.  Will rule out with three sets of cardiac enzymes.      Place on heparin, aspirin, Plavix, Lipitor, beta blocker.  If enzymes      negative, risk stratify with exercise Cardiolite.  If positive. pursue      heart catheterization.  2.  Hyperlipidemia.  Check a.m. lipid panel.  Continue her statin.  3.  Hypertension.  Continue beta blocker.  Add ACE inhibitor if  needed.  4.  Diabetes.  Place on sliding scale insulin.  Check a hemoglobin A1c.   She is full code.       GLA/MEDQ  D:  07/27/2004  T:  07/27/2004  Job:  [id number]

## 2010-11-28 NOTE — Discharge Summary (Signed)
[name], [name]              ACCOUNT NO.:  [id number]   MEDICAL RECORD NO.:  [id number]          PATIENT TYPE:  OBV   LOCATION:  6529                         FACILITY:  [facility]   PHYSICIAN:  Rollene Rotunda, MD, FACCDATE OF BIRTH:  01-01-1941   DATE OF ADMISSION:  08/22/2006  DATE OF DISCHARGE:  08/24/2006                               DISCHARGE SUMMARY   PROCEDURES:  1. Cardiac catheterization.  2. Coronary arteriogram.  3. Left ventricular angiogram.  4. Bare metal stent x2 to the proximal and mid right coronary artery.  5. [place] to the right femoral artery.   PRIMARY DIAGNOSIS:  Unstable anginal pain.   SECONDARY DIAGNOSES:  1. Status post anterior myocardial infarction in 2005 with a Taxus      stent to the left anterior descending artery.  2. Preserved left ventricular function with an ejection fraction of      60% at catheterization this admission.  3. Diabetes.  4. Hypertension.  5. Hyperlipidemia.  6. Family history of coronary artery disease  7. Irritable bowel syndrome.  8. History of pedal neuropathy.  9. Status post cholecystectomy, hysterectomy, and gastric bypass.  10.Allergy or intolerance to CODEINE, DARVON and LIPITOR.   TIME AT DISCHARGE:  40 minutes.   HOSPITAL COURSE:  [name] is a 70 year old female with a previous  history of coronary artery disease.  On the day prior to admission she  had 9/10 chest pain.  The pain started at rest but she had exerted  herself in an unusual way that day.  Her symptoms were relieved with  sublingual nitroglycerin x2.  She slept well and on the day of admission  with minimal exertion in church she had 8/10 chest pain that was  associated with shortness of breath, nausea and diaphoresis and radiated  to her left upper arm.  She described it as with a heaviness and a sharp  pain.  She received sublingual nitroglycerin by EMS as well as in the  ER, and her symptoms resolved.  She was admitted for further  evaluation  and treatment.   Her cardiac enzymes were negative for MI, but it was felt that she  needed cardiac catheterization to further define her anatomy.  This was  performed on August 23, 2006.   The cardiac catheterization showed a proximal RCA at 70% and a distal  RCA at 80%.  These were both treated with drug-eluting Endeavor stents,  reducing each stenosis to 0.  Her previously-placed LAD stent was patent  with no significant in-stent restenosis and her EF was within normal  limits.  Dr. Excell Seltzer felt that she should be on aspirin and Plavix  lifelong.   On August 24, 2006, she had no chest pain or shortness of breath with  ambulation.  Her Pravachol was increased from 40 to 80 mg a day with  Zocor 40 mg as a substitute.  A lipid profile was performed, which  showed a total cholesterol of 175, triglycerides 175, HDL 43, LDL 97.  Medication was increased because her goal LDL is less than 70.  [name] was seen  by cardiac rehab given an exercise program by Dr.  Antoine Poche.  She was considered stable for discharge on August 24, 2006,  with outpatient follow-up arranged.   DISCHARGE INSTRUCTIONS:  1. Her activity level is to be increased gradually.  2. She is to stick to a diabetic, heart-healthy diet.  3. She is to follow up with Dr. Morrie Sheldon as needed and with Dr. Antoine Poche.  4. She has an appoint with Dr. Jenene Slicker physician extender on      February 26 at 10:15.   DISCHARGE MEDICATIONS:  1. Plavix 75 mg daily.  2. Metoprolol 25 mg b.i.d.  3. Actos 30 mg a day.  4. Janumet 50/1000 b.i.d., restart August 26, 2006.  5. Sublingual nitroglycerin p.r.n.  6. Aspirin 325 mg daily.  7. Pravachol 80 mg a day.      Theodore Demark, PA-C      Rollene Rotunda, MD, [facility]  Electronically Signed    RB/MEDQ  D:  08/24/2006  T:  08/24/2006  Job:  [id number]   cc:   Dr. Morrie Sheldon

## 2010-11-28 NOTE — Cardiovascular Report (Signed)
NAME:  [name], [name]                        ACCOUNT NO.:  [id number]   MEDICAL RECORD NO.:  [id number]                   PATIENT TYPE:  INP   LOCATION:  4733                                 FACILITY:  [facility]   PHYSICIAN:  Arturo Morton. Riley Kill, M.D. [facility]         DATE OF BIRTH:  02/12/1941   DATE OF PROCEDURE:  10/25/2003  DATE OF DISCHARGE:  10/26/2003                              CARDIAC CATHETERIZATION   INDICATIONS:  [name] is a delightful 70 year old woman who previously  presented with an anterior wall infarction. She had a total occlusion of her  LAD and underwent stenting with a drug alluding stent to the LAD.  She has  done well, but recently had an episode of chest discomfort which was  prolonged.  Fortunately, there were no electrocardiographic abnormalities.  Her EKG reveals only minor nonspecific T wave abnormality.  Enzymes were  negative x3.  The patient was brought to the catheterization laboratory for  further evaluation and treatment.  Recent LDL cholesterol was less than 60.  Liver enzymes were normal.  The current study was done to assess coronary  anatomy.   She was brought to the catheterization laboratory for further evaluation and  treatment.   PROCEDURES:  1. Left heart catheterization.  2. Selective coronary arteriography.  3. Selective left ventriculography.   CARDIOLOGIST:  Arturo Morton. Riley Kill, M.D.   DESCRIPTION OF PROCEDURE:  The patient was brought to the catheterization  laboratory and prepped and draped in the usual fashion through an anterior  puncture of the right femoral artery was easily entered.  A 6 French sheath  was placed.  Views of the left and right coronary arteries were obtained in  multiple angiographic projections and ventriculography was performed in the  RAO projection.  She tolerated the procedure well and there were no  complications.  She was taken to the holding area in satisfactory clinical  condition.   HEMODYNAMIC  DATA:  1. Central aortic pressure 175/95, mean 128.  2. Left ventricle 189/19.  3. No gradient on pullback across the aortic valve.   ANGIOGRAPHIC DATA:  1. Ventriculography was performed in the RAO projection.  There was a fair     amount of ectopy, nonetheless the anterior wall appeared to be completely     recovered without an evident focal wall motion abnormality.  2. On plain fluoroscopy there was evidence of a previously placed stent in     the midleft anterior descending artery.  3. The left main coronary artery was free of critical disease.  4. The left anterior descending artery courses to the apex.  The LAD proper     demonstrates a widely patent stent with less than 20% focal narrowing.     This overlaps the origin of a small diagonal branch.  The small diagonal     branch is relatively small, has TIMI 3 flow, but has what appears to be     probably  a 70-80% area of narrowing.  The distal LAD is fairly smooth,     with minor luminal irregularities.  5. There is a ramus intermedius vessel that is fairly large and __________     occupies the distribution of the circumflex system. This ramus     intermedius vessel perhaps has about midnarrowing, but this does not     appear to be high-grade nor flow limiting. It is fairly tortuous.  There     is a tiny __________ circumflex which is free of significant disease.  6. The right coronary artery is a large dominant vessel providing a     posterior descending and posterolateral branch. In the junction of the     proximal and mid vessel was a focal area of stenosis of about 50-60%.     When compared to the previous study there is either very little or mild     progression of disease.  In the midportion of the vessel is also an area     of segmental plaquing of about 40%.  None of these lesions appear to be     flow limiting.  The posterior descending is a large caliber vessel as is     the posterolateral branch.  There is very mild  atherosclerotic change at     the crux of the bifurcation, but none of this appears to be flow     limiting.   CONCLUSIONS:  1. When compared to the previous study left ventricular function is now     recovered with resolution of the segmental wall motion abnormality noted     on the previous study.  2. There is continued patency of the Taxus drug alluding stent at the     previous stent site.  3. There is moderate pinching of the small diagonal branch.  4. The right coronary artery demonstrates moderate, but not flowing limiting     disease.   DISPOSITION:  I have reviewed the films carefully. I have also compared them  to the old films.  There is minor progression of disease in the proximal  right coronary artery when compared to the previous study.  This does not  appear to be flow limiting and aggressive risk factor reduction with control  of the hemoglobin A1c and reduction in LDL cholesterol is highly  recommended.  The patient should continue her exercise program.   With regards to the symptoms, her EKG is not abnormal the enzymes are  negative.  The diagonal is relatively small.  The diagonal could be  approached with dilatation of the ostium at its takeoff from the stent site,  but this would likely result in some medial displacement of stent.  The  patient is currently only on Plavix and does not have dual aspirin/Plavix  therapy because of aspirin related side effects.  My inclination would be to  try to treat her medically without opening the small diagonal unless  symptoms become uncontrolled on a good medical region as it appears that the  diagonal would not likely affect her long-term prognosis.                                               Arturo Morton. Riley Kill, M.D. [facility]    TDS/MEDQ  D:  10/25/2003  T:  10/28/2003  Job:  [id number]   cc:  Arturo Morton. Riley Kill, M.D. [facility]   Ernestina Penna, M.D.  [address] [place]  [place] [address]  Fax: [phone]   Magnus Sinning.  Dimple Casey, M.D.  [address] [place]  [place] [address]  Fax: [phone]   Birdena Jubilee  [facility]   Cardiovascular Laboratory

## 2010-11-28 NOTE — H&P (Signed)
[name], HLADIK NO.:  [id number]   MEDICAL RECORD NO.:  [id number]          PATIENT TYPE:  EMS   LOCATION:  MAJO                         FACILITY:  [facility]   PHYSICIAN:  Pricilla Riffle, M.D.    DATE OF BIRTH:  Jan 12, 1941   DATE OF ADMISSION:  02/09/2005  DATE OF DISCHARGE:                                HISTORY & PHYSICAL   PHYSICIANS:  Cardiologist: Rollene Rotunda, M.D.  Primary physician: Magnus Sinning. Rice, M.D.   PRINCIPAL DIAGNOSIS AND CHIEF COMPLAINT:  Chest pain.   HISTORY OF PRESENT ILLNESS:  The patient complained of chest pain last night  and tightness that started after midnight, exertional in nature.  Chest pain  was relieved by sublingual nitroglycerin x 2.  Today at around 9:30 had  onset of substernal chest pain, about 7/10, with positive shortness of  breath, positive diaphoresis, and positive nausea.  The patient again took  sublingual nitroglycerin x 1 with some decrease in pain and at that time  called [facility] office and was told to come to the ER.  Pain  currently is 3/10 with tightness and is similar to her usual angina.  She  had recently seen her primary care doctor who had increased her ACE  inhibitor for increase in blood pressure.   PAST MEDICAL HISTORY:  1.  Prior cardiac catheterization in August 2005 which showed a left main      that was normal, LAD with 10% occlusion over the stent.  The circumflex      was normal.  RCA showed a 60% lesion proximal and mid, and there was      mild to moderate disease diffusely throughout.  She had a Cardiolite      done in January 2006 which showed no evidence of ischemia or infarct and      EF of 71%.  2.  History of CAD with anterior MI in January 2005 with a Taxus stent to      the LAD.  3.  Diabetic myelitis with peripheral neuropathy.  4.  Hypertension.  5.  Dyslipidemia on statins.  6.  GERD.  7.  Hiatal hernia.  8.  Irritable bowel syndrome.  9.  Previous catheterization  history in January 2005 that was related to MI,      catheterization in April 2005, and catheterization in August 2005.   PAST SURGICAL HISTORY:  1.  Status post cholecystectomy.  2.  Status post hysterectomy.  3.  History of gastric bypass.  4.  Total hysterectomy.   MEDICATIONS ON ADMISSION:  1.  Aspirin 81 mg.  2.  Plavix 75 mg.  3.  Lipitor 20 mg.  4.  Lopressor 50 mg b.i.d.  5.  Aciphex 20 mg daily.  6.  Metformin 500 mg b.i.d.  7.  Fatty acid 1 tablet b.i.d.  8.  Multivitamins daily.  9.  Antioxidant b.i.d.  10. Arginine 5000 mg daily.  11. Cranberry tablets t.i.d.  12. Fosinopril 10 mg daily.   SOCIAL HISTORY:  [place] her  husband, and she has  three healthy children.  She denies any history of  tobacco use or alcohol use.  Herbal medications as listed above.   FAMILY HISTORY:  Her mother has a history of hypertension and diabetes  mellitus.  Father deceased at age 34 due to an MI.  Siblings: There is no  history of coronary artery disease.   REVIEW OF SYSTEMS:  Significant for chest pain, shortness of breath, dyspnea  on exertion, and arthralgia.  MUSCULOSKELETAL: Pain in her back.  Occasional  diarrhea and occasional GERD.  All other symptoms negative.   PHYSICAL EXAMINATION:  VITAL SIGNS:  Temperature 97.1, pulse 83, respiratory  rate 18, blood pressure up admission 170/103, taken again at 158/93 and a  third measurement at 183/108.  GENERAL:  She is a white female in no obvious distress.  HEENT:  Pupils equal, round, and reactive to light.  Sclerae clear.  Nares  without discharge.  EOMI.  ABDOMEN:  Soft, nontender.  No rebound or guarding.  No tenderness, no  hepatosplenomegaly.  NECK:  Supple without JVD.  No bruits.  LYMPHADENOPATHY:  None.  CARDIOVASCULAR:  Heart regular rate and rhythm, S1, S2, S4 without any  murmurs, rubs, or gallops.  LUNGS: Clear to auscultation bilaterally without wheezes, rubs, or  rhonchi.  SKIN:  No lesions or rashes.  EXTREMITIES:  2+ pulses.  No clubbing, cyanosis, or edema, rashes or  lesions.  MUSCULOSKELETAL:  No joint deformities or effusions.  No spine or CVA  tenderness.  NEUROLOGIC:  Alert and oriented x 3.  Cranial nerves II-XII grossly intact.  Strength 5/5 all extremities.  All groups normal sensation throughout.   Chest x-ray shows minimal bibasilar atelectasis.   EKG shows normal sinus rhythm with no acute changes, rate of 81.  PR 163,  QRS 79, QTC 435.   Labs show white blood cell count of 3.5, hemoglobin 13.1, hematocrit 38.1,  platelet count 222.  PT 12.7, INR 0.9. Cardiac enzymes point-of-care  negative.   ASSESSMENT AND PLAN:  The patient is a 70 year old female with a history of  coronary artery disease status post percutaneous transluminal cardiac  angioplasty and stent, last catheterization August 2005.  The patient was  admitted in January 2006 with chest pain similar to today's and ruled out  for an myocardial infarction.  She had a Cardiolite in February 2006 that  was negative.  No chest pain until last night.  Pain with minimal activity  similar to prior myocardial infarction and catheterization.  The patient  does note some positional component.  When she is squatting, the pain is  worse but also says similar to previous myocardial infarction.   RECOMMENDATIONS:  1.  GI cocktail which showed no change in chest pain.  2.  Change her Aciphex 20 mg daily to Nexium 40 mg b.i.d.  3.  Start the patient on heparin drip per pharmacy protocol.  4.  IV Lopressor 5 mg IV push for blood pressure.  5.  Admit patient to rule out MI.  If negative, start some improvement in      her blood pressure control and consult GI.  6.  Dyslipidemia.  The patient is on statins.  Lipid panel will be drawn      and adjusted as needed.  7.  Diabetes mellitus.  Inpatient continue oral anti-hyperglycemia control.      AH/MEDQ  D:  02/09/2005  T:   02/09/2005  Job:  [id number]   cc:   Rollene Rotunda, M.D.  [address]  [address]  [place]  [place] [address]   Magnus Sinning. Dimple Casey, M.D.  [address] [place]  [place] [address]  Fax: [phone]

## 2010-11-28 NOTE — Cardiovascular Report (Signed)
[name], [name]              ACCOUNT NO.:  [id number]   MEDICAL RECORD NO.:  [id number]          PATIENT TYPE:  OBV   LOCATION:  6529                         FACILITY:  [facility]   PHYSICIAN:  Veverly Fells. Excell Seltzer, MD  DATE OF BIRTH:  09/01/1940   DATE OF PROCEDURE:  08/23/2006  DATE OF DISCHARGE:                            CARDIAC CATHETERIZATION   PROCEDURES:  1. Left heart catheterization.  2. Selective coronary angiography.  3. Left ventricular angiography.  4. Primary stenting of the proximal and mid right coronary artery.  5. StarClose the right femoral artery.   INDICATIONS:  [name] is a 70 year old woman with known coronary  artery disease who presented with her typical anginal symptoms.  She  developed progressive symptoms and now presents with unstable angina.  She was referred for cardiac catheterization in this setting.  She has  had prior stenting of her LAD.   PROCEDURAL DETAILS:  Risks and indications of the procedure were  explained to the patient.  Informed consent was obtained.  The right  groin was prepped, draped and anesthetized with 1% lidocaine.  Using the  modified Seldinger technique, a 6-French sheath was placed in the right  femoral artery without difficulty.  Multiple views of the left and right  coronary arteries were taken with standard catheters.  Following  selective coronary angiography, an angled pigtail catheter was inserted  into the left ventricle, and pressures were recorded.  The left  ventriculogram was performed.  Pullback across the aortic valve was  done.   At the conclusion of the diagnostic portion of the case, the right  coronary artery was found to have high-grade serial stenosis in the  proximal and distal vessel.  I elected to intervene in this area.  The  patient has been on chronic Plavix.  She has done well with the stent in  her LAD.  She was given additional 300 mg of Plavix and then was given  Angiomax for  anticoagulation.  Once therapeutic ACT was achieved, a JR-4  guide catheter was inserted.  A Cougar guidewire was passed into the  posterior AV segment of the right coronary artery beyond both lesions.  There was a distal 80% lesion that was treated with primary stenting  using a 2.5 x 12 mm Endeavor stent.  This was deployed at 16  atmospheres.  Following stent deployment, the proximal lesion was  treated with a 3.0 x 18 mm Endeavor stent.  There were two 70% lesions  that were both covered.  The proximal stent was also deployed at 16  atmospheres pressure.  Following the proximal stent deployment, the  distal stent was postdilated with a 3.0 x 8-mm Quantum Maverick balloon  up to 20 atmospheres.  This was done on two subsequent inflations.  A  3.5 x 12-mm Quantum Maverick balloon was then used to post dilate the  proximal stent and also was inflated to 20 atmospheres on two  inflations.  At the conclusion of the procedure, both stents appeared  well expanded.  There was mild residual stenosis in the mid portion of  the vessel that  appear nonobstructive.  There was TIMI-3 flow throughout  the vessel, and the patient was pain free.  A StarClose device was then  used to seal the right femoral arteriotomy.  There were no immediate  complications.   FINDINGS:  Aortic pressure 169/71 with a mean of 115, left ventricular  pressure 168/4 with an end-diastolic pressure of 17.   CORONARY ANGIOGRAPHY:  The left mainstem is angiographically normal.  It  trifurcates into the LAD, left circumflex, and ramus intermedius.   The LAD is a large-caliber vessel that courses down and around the left  ventricular apex.  There is a stented portion in the proximal vessel  that is widely patent.  There is a first diagonal branch arising from  the stented segment that also has TIMI-3 flow and appears widely patent.   Ramus intermedius has no significant angiographic disease.  It is medium  caliber.   Left  circumflex is small.  There is no significant angiographic disease.   Right coronary artery is dominant.  It is a large vessel and gives off a  large PDA branch as well as a posterior AV segment that gives off a  posterolateral branch as well as the AV nodal artery.  There are serial  70% lesions in the proximal portion of the vessel.  The mid portion of  vessel has a long 30% stenosis, and the distal portion of the vessel has  a discrete 80% stenosis.   Left ventricular function as assessed by 30-degree right anterior  oblique left ventriculogram, demonstrates normal systolic function with  an estimated LVEF of 60%.   ASSESSMENT:  1. Two-vessel coronary artery disease with a patent left anterior      descending stent and high-grade stenosis in the proximal and distal      right coronary artery.  2. Normal left ventricular function.   PLAN:  As described above, PCI the right coronary artery was performed  using two drug-eluting stents, one in each some segment of vessel.  Recommend continued aspirin and clopidogrel therapy lifelong if the  patient tolerates.  We will likely discharge the patient home tomorrow.  She will have routine post PCI care.      Veverly Fells. Excell Seltzer, MD  Electronically Signed     MDC/MEDQ  D:  08/23/2006  T:  08/23/2006  Job:  [id number]

## 2010-11-28 NOTE — Discharge Summary (Signed)
[name], [name]              ACCOUNT NO.:  [id number]   MEDICAL RECORD NO.:  [id number]          PATIENT TYPE:  INP   LOCATION:  3736                         FACILITY:  [facility]   PHYSICIAN:  Rollene Rotunda, M.D.   DATE OF BIRTH:  1941/03/18   DATE OF ADMISSION:  07/26/2004  DATE OF DISCHARGE:  07/28/2004                           DISCHARGE SUMMARY - REFERRING   BRIEF HISTORY:  This is a 70 year old female with a history of coronary  artery disease as well as multiple medical problems.  She had an acute MI in  January of 2005, and had a stent to her LAD at that time.  She has had  subsequent admissions for chest pain but no further interventions.   The patient was admitted to [facility] on July 26, 2004, for evaluation of recurrent chest pain which was thought to be  atypical.   PAST MEDICAL HISTORY:  1.  Coronary artery disease with an acute anterior MI in January of 2005,      with subsequent placement of a TAXUS stent to the LAD.  A repeat      catheterization in, I believe, August of 2005, showed the stent to be      patent with normal ejection fraction and mild to moderate diffuse      disease.  2.  History of diabetes mellitus with peripheral neuropathy.  3.  Hypertension.  4.  Dyslipidemia.  5.  Gastroesophageal reflux disease./  6.  Hiatal hernia.  7.  Irritable bowel syndrome.  8.  She is status post cholecystectomy.  9.  Status post hysterectomy.   ALLERGIES:  1.  DARVON.  2.  CODEINE.   MEDICATIONS PRIOR TO ADMISSION:  1.  Aspirin 325 mg daily.  2.  Plavix 75 mg daily.  3.  Lipitor 20 mg daily.  4.  Lopressor 50 mg b.i.d.  5.  Protonix 40 mg daily.  6.  Avandia.  7.  There is also a question of Glucophage.  Medications were somewhat      unclear.   SOCIAL HISTORY:  The patient lives with her three children and husband.  She  does not smoke.   FAMILY HISTORY:  The patient's mother has hypertension and diabetes.  Her  father  died at age 81 from an MI.   HOSPITAL COURSE:  As noted, this patient was admitted to [facility] for evaluation of chest pain with known coronary artery  disease.  She ruled out for an MI.  She was kept in the hospital over the  weekend on IV heparin.  She had no further significant chest pain.  Decision  was made to proceed with an outpatient adenosine Myoview that is to be  performed in our office today at 12 noon.  The patient will be discharged  for this procedure.  This was as per discussion with Dr. Antoine Poche.   LABORATORY DATA:  A CBC on the day of discharge revealed hemoglobin 12.1,  hematocrit 36.4, wbc 3.4000, platelets 193,000.  Chemistries revealed BUN  12, creatinine 0.7, potassium 4.4, glucose 124.  Cardiac enzymes were  negative x3.  A lipid profile revealed cholesterol 145, triglycerides 176,  HDL 48, LDL 62, TSH 3.589.   A chest x-ray showed bibasilar atelectasis.   DISCHARGE MEDICATIONS:  1.  The patient was told to continue coated aspirin 325 mg daily.  2.  Plavix 75 mg daily.  3.  Lipitor 20 mg daily.  4.  Lopressor 50 mg b.i.d.  5.  Aciphex 20 mg daily.  6.  Avandia as previously taken.  7.  Multivitamin as previously taken.  8.  Lamisil as previously taken.  9.  Nitroglycerin p.r.n.  She was given a new prescription.  10. Glucophage as previously taken.  11. Tylenol as needed for pain.   The patient was told to avoid any strenuous activity until after these  stress test results were available.  She was to stay on a low salt, low fat,  diabetic diet.   The patient was to have a nuclear stress test in the [place] office today.  She is to be there by 12 noon.  She is to have nothing to eat or drink until  after the test.   The patient will follow up with Dr. Antoine Poche August 18, 2004, in [place],  [place], as scheduled.  She will follow up with her primary care  physician, Dr. Dimple Casey, in [facility] as  scheduled.   PROBLEM LIST AT TIME OF DISCHARGE:  1.  Chest pain, myocardial infarction ruled out.  2.  Known coronary artery disease with previous stent to the left anterior      descending.  3.  Subsequent catheterization revealing patent stent.  4.  Diabetes mellitus.  5.  Hypertension.  6.  Other medical problems as listed above.       DR/MEDQ  D:  07/28/2004  T:  07/28/2004  Job:  [id number]   cc:   Magnus Sinning. Dimple Casey, M.D.  [address] [place]  [place] [address]  Fax: [phone]

## 2010-11-28 NOTE — Cardiovascular Report (Signed)
NAME:  [name], [name]                        ACCOUNT NO.:  [id number]   MEDICAL RECORD NO.:  [id number]                   PATIENT TYPE:  INP   LOCATION:  4733                                 FACILITY:  [facility]   PHYSICIAN:  Rollene Rotunda, M.D.                DATE OF BIRTH:  [birth year]   DATE OF PROCEDURE:  DATE OF DISCHARGE:                              CARDIAC CATHETERIZATION   PROCEDURE:  Left-heart catheterization, coronary arteriography.   INDICATION:  This is a patient with chest pain and EKG demonstrating  probable recent anteroseptal infarct.   DESCRIPTION OF PROCEDURE:  Left-heart catheterization was performed via the  right femoral artery.  The artery was cannulated using anterior wall  puncture.  A number 6 French arterial sheath was inserted via the modified  Seldinger technique.  Preformed Judkins and a pigtail catheter were  utilized.  The patient tolerated the procedure well.   RESULTS:  Hemodynamic:  LV 166/27, AO 173/89.   CORONARIES:  1. The left main was normal.  2. The LAD was occluded after the first diagonal.  The first diagonal was     somewhat small.  The distal LAD was seen to fill predominantly via right     to left collaterals.  The circumflex was small in the AV grove.  There     was a very large ramus intermediate with mid 25% stenosis.  The right     coronary artery was very large, a dominant vessel with a proximal 30%     stenosis.  There was a large PDA and posterolateral which were normal.   LEFT VENTRICULOGRAM:  A left ventriculogram was obtained in the RAO  projection.  It was 50% with akinesis in the anteroapical segment.   CONCLUSION:  Severe single-vessel coronary artery disease.   PLAN:  The patient will have attempted percutaneous revascularization per  Dr. Riley Kill.                                               Rollene Rotunda, M.D.    JH/MEDQ  D:  07/27/2003  T:  07/27/2003  Job:  [id number]

## 2010-11-28 NOTE — Discharge Summary (Signed)
[name], [name]              ACCOUNT NO.:  [id number]   MEDICAL RECORD NO.:  [id number]          PATIENT TYPE:  INP   LOCATION:  2001                         FACILITY:  [name]   PHYSICIAN:  Rollene Rotunda, M.D.   DATE OF BIRTH:  1940/10/21   DATE OF ADMISSION:  09/18/2005  DATE OF DISCHARGE:  09/21/2005                                 DISCHARGE SUMMARY   PRIMARY CARDIOLOGIST:  Rollene Rotunda, M.D.   PRIMARY CARE PHYSICIAN:  Ernestina Penna, M.D., [place], [place].   PRINCIPAL DIAGNOSIS:  Unstable angina.   OTHER DIAGNOSES:  1.  Coronary artery disease, status post stenting of the left anterior      descending following acute anterior myocardial infarction in 2005, with      Taxus drug-eluting stent.  2.  Hypertension.  3.  Hyperlipidemia.  4.  Type 2 diabetes mellitus.  5.  Obesity.  6.  Irritable bowel syndrome.  7.  Gastroesophageal reflux disease.  8.  History of cholecystectomy.  9.  Status post hysterectomy.  10. Status post gastric bypass.  11. History of total hysterectomy.   ALLERGIES:  CODEINE and DARVON.   PROCEDURES:  Exercise Myoview.   HISTORY OF PRESENT ILLNESS:  A 70 year old white female with prior history  of CAD, status post anterior MI in 2005 with Taxus drug-eluting stent  placement to the LAD. She was in her usual state of health until September 17, 2005, when approximately 11 p.m. began to experience sharp left breast and  chest pain, only partially relieved after two sublingual nitroglycerin. She  was to fall asleep that night and had recurrent symptoms the morning of  March 9th prompting her to present to the primary care physician and  subsequently taken to the [name] ED where she ruled out for MI and was  admitted for evaluation.   HOSPITAL COURSE:  Following rule out, [name] underwent an exercise  Myoview this morning exercising for a total of 6 minutes 30 seconds  achieving a maximum heart rate of 135 beats per minute with  mild 3/10 chest  discomfort developing. Perfusion imaging showed no evidence of ischemia and  as a result she is being discharged home today in satisfactory condition.   DISCHARGE LABORATORIES:  Hemoglobin 11.7, hematocrit 33.2, WBC 3.6, platelet  count 204,000. Heparin 0.52. Sodium 139, potassium 4.1, chloride 104, CO2  23, BUN 10, creatinine 0.8, glucose 140, total bilirubin 1.0, alkaline  phosphatase 50, AST 24, ALT 22, total protein 6.7, albumin 4.3, calcium 9.8.  Cardiac markers negative times three. Total cholesterol 233, triglycerides  192, HDL 48, and LDL 147.   DISPOSITION:  The patient is being discharged home today in good condition.   FOLLOWUP PLANS & APPOINTMENTS:  She is asked to follow up with her primary  care physician, Dr. Christell Constant, in one to two weeks. She has follow-up with Dr.  Antoine Poche on August 18th at 3:30 p.m. in [place].   DISCHARGE MEDICATIONS:  1.  Aspirin 81 mg daily.  2.  Plavix 75 mg daily.  3.  Fosinopril 10 mg daily.  4.  Plavix  75 mg daily.  5.  Prilosec OTC 20 mg daily.  6.  Byetta 5 mcg b.i.d., to be increased to 10 mcg b.i.d. in approximately      one month.  7.  Lopressor 50 mg b.i.d.  8.  Glucophage 1000 mg q.a.m., 1500 mg q.p.m.  9.  Nitroglycerin 0.4 mg sublingual p.r.n. chest pain.  10. Red Yeast Rice and fish oil as previously taken.   *Of note the patient has previous intolerance to Lipitor and is unwilling to  try any other statins or Zetia at this time.   OUTSTANDING LABS & STUDIES:  None.   DURATION OF DISCHARGE ENCOUNTER:  45 minutes.      Ok Anis, NP    ______________________________  Rollene Rotunda, M.D.    CRB/MEDQ  D:  09/21/2005  T:  09/22/2005  Job:  [id number]   cc:   Rollene Rotunda, M.D.  [address]  [address]  [place]  [place] [address]   Ernestina Penna, M.D.  Fax: [phone]

## 2010-11-28 NOTE — Discharge Summary (Signed)
NAMEMARGARIE, [name]              ACCOUNT NO.:  [id number]   MEDICAL RECORD NO.:  [id number]          PATIENT TYPE:  INP   LOCATION:  2002                         FACILITY:  [facility]   PHYSICIAN:  Cecil Cranker, M.D.DATE OF BIRTH:  10-24-1940   DATE OF ADMISSION:  02/09/2005  DATE OF DISCHARGE:  02/11/2005                                 DISCHARGE SUMMARY   PRIMARY CARE PHYSICIAN:  Magnus Sinning. Rice, M.D.   PRIMARY CARDIOLOGIST:  Rollene Rotunda, M.D.   PRINCIPAL DIAGNOSIS:  Chest pain/coronary artery disease.   OTHER DIAGNOSES:  1.  Hypertension.  2.  Hyperlipidemia.  3.  Type 2 diabetes mellitus.  4.  Peripheral neuropathy.  5.  Gastroesophageal reflux disease .  6.  Hiatal hernia.  7.  Irritable bowel syndrome.  8.  Status post cholecystectomy.  9.  Status post hysterectomy.  10. History of gastric bypass.   HISTORY OF PRESENT ILLNESS:  A 70 year old white female with prior history  of CAD, status post anterior MI  in January 2005 with Taxus stent placed to  the LAD, subsequent cath in August 2005 showing patency of that stent and  functional study in January 2006 without evidence of ischemia or infarct. On  February 09, 2005, at approximately 9:30 a.m. she had sudden onset of 7/10  substernal chest pressure associated with shortness of breath, diaphoresis,  and nausea, partially relieved with one sublingual nitroglycerin. She called  into the [facility] office and was about to present to the ED. Upon  arrival to the ED pain was 3/10 and similar to previous angina. She was  admitted for further evaluation.   HOSPITAL COURSE:  The patient ruled out for MI and cardiac enzymes, and  underwent left heart catheterization on August 1st revealing patency of the  previously placed LAD stent, a 90% lesion in the D-1 which has been present  previously. There were minor irregularities in the circumflex and tandem 60%  stenosis in the proximal mid RCA. No lesions were felt to  be critically  stenosed and it was recommended for the time being that she be managed  medically with functional studies to determine whether or not she has any  inferior ischemia. She was also seen by GI for consideration of non-cardiac  chest pain and their workup is in process and will continue as an outpatient  with Dr. Christella Hartigan. The patient has not had any recurrent chest discomfort and  is being discharged home today in satisfactory condition. She is scheduled  for an exercise Myoview on Monday on August 7th at 10 a.m.   DISCHARGE LABORATORY:  Hemoglobin 11.9, hematocrit 34.7, WBC 3.9, platelet  count 203,000. MCV 89.6. Sodium 141, potassium 3.8, chloride 108, CO2 25,  BUN 6, creatinine 0.7, glucose 168, total bilirubin 1.2, alkaline  phosphatase 51, AST 19, ALT 23, albumin 4.1. Cardiac enzymes are negative  times three. Total cholesterol is 113, triglycerides 117, HDL 41, LDL 49,  calcium 9.0.  D-dimer less than 0.22. TSH 2.832.   DISPOSITION:  The patient is being discharged home in good condition.   FOLLOWUP PLAN AND APPOINTMENT:  She is asked to follow up with Dr. Montey Hora, her primary care physician, in one to two weeks. She has an  appointment for exercise stress testing on Monday, February 16, 2005, at 10  a.m. with [facility]. She will then follow up with Dr. Rollene Rotunda on August 30th at 10:15 a.m.   DISCHARGE MEDICATIONS:  1.  Aspirin 81 mg daily.  2.  AcipHex 20 mg b.i.d.  3.  Fosinopril 10 mg daily.  4.  Plavix 75 mg daily.  5.  Metoprolol 50 mg b.i.d.  6.  Lipitor 20 mg daily.  7.  Metformin 1000 mg q.a.m. and 1500 mg q.a.m. to be resumed on February 12, 2005.  8.  Nitroglycerin 0.4 mg sublingual p.r.n. chest pain.   OUTSTANDING LABORATORY STUDIES:  None.   DURATION OF DISCHARGE ENCOUNTER:  45 minutes.      Christop   CRB/MEDQ  D:  02/11/2005  T:  02/11/2005  Job:  [id number]   cc:   Rollene Rotunda, M.D.  [address]  [address]   [place]  [place] [address]   Magnus Sinning. Dimple Casey, M.D.  [address] [place]  [place] [address]  Fax: [phone]

## 2010-11-28 NOTE — H&P (Signed)
NAMEVERNESTINE, [name]              ACCOUNT NO.:  [id number]   MEDICAL RECORD NO.:  [id number]          PATIENT TYPE:  OBV   LOCATION:  2630                         FACILITY:  [facility]   PHYSICIAN:  Learta Codding, MD,FACC DATE OF BIRTH:  12-Mar-1941   DATE OF ADMISSION:  08/22/2006  DATE OF DISCHARGE:                              HISTORY & PHYSICAL   PRIMARY CARE PHYSICIAN:  Dr. Morrie Sheldon.   PRIMARY CARDIOLOGIST:  Dr. Rollene Rotunda.   CHIEF COMPLAINT:  Chest pain.   HISTORY OF PRESENT ILLNESS:  [name] is a 70 year old female with a  history of coronary artery disease.  Yesterday, she significantly  exerted herself doing yard work which included moving multiple bales of  pine needles.  Last p.m. after supper, she had onset of substernal chest  pain at rest.  It reached a 9/10.  She took sublingual nitroglycerin x2,  and her symptoms resolved.  She slept well.  Of note, she states that  she ate too much food and was having significant gastric distress, which  required her to induce vomiting prior to the onset of chest pain.   This a.m. she felt well, and while in church she had again onset of  substernal chest pain that reached an 8/10.  Both episodes were  associated with shortness of breath, nausea and diaphoresis.  Today, the  substernal chest pain radiated to her left upper arm.  She describes it  as both a heaviness and a sharp pain.  She is currently pain free,  except for some slight shortness of breath.  She received aspirin 81 mg  x4 and nitroglycerin sublingual x1 by EMS, and in the emergency room  received sublingual nitroglycerin x2.  The symptoms are similar to her  MI symptoms, but she does not normally get exertional chest pain.   PAST MEDICAL HISTORY:  1. Status post anterior myocardial infarction in January 2005 with a      Taxus stent to the LAD.  2. Status post episodes of chest pain.  In 2005, 2006 and 2007, with      re-look catheterization x3, first diagonal 90%  and RCA 60%, no      significant in-stent restenosis.  3. Preserved left ventricular function with an EF described as normal      at catheterization.  4. History of irritable bowel syndrome.  5. Diabetes.  6. Hypertension.  7. Hyperlipidemia.   FAMILY HISTORY:  Coronary artery disease.   SURGICAL HISTORY:  She is status post cardiac catheterization, as well  as cholecystectomy, hysterectomy, gastric bypass.   ALLERGIES:  SHE IS ALLERGIC OR INTOLERANT TO CODEINE, DARVON AND  LIPITOR.   SOCIAL HISTORY:  She lives in [place] with her husband, is retired  from [employer].  She has no history of alcohol, tobacco or drug abuse.  She  remains active, but does not exercise regularly.   FAMILY HISTORY:  Her mother is alive at age 77 with no history of  coronary artery disease.  Her father died at age 70 of an MI, and she  has one sister who is living  with a history of heart disease.   CURRENT MEDICATIONS:  1. Plavix 75 mg a day.  2. Lopressor 25 mg b.i.d.  3. Actos 30 mg a day.  4. Januvia 50/1000 b.i.d.  5. Sublingual nitroglycerin p.r.n.  6. Aspirin 81 mg a day.  7. Pravachol 40 mg a day.   REVIEW OF SYSTEMS:  She denies any recent fevers, chills or sweats.  She  has IBS symptoms approximately once a week with associated cramping,  abdominal pain and diarrhea.  She does not feel that she has regular  reflux symptoms.  She denies any recent illness coughing and wheezing.  She has occasional arthralgias but generally feels pretty well.  She  does have some peripheral neuropathy in her legs and feet, which is  mainly noticed by her at night.  Review of systems is otherwise  negative.   PHYSICAL EXAM:  VITAL SIGNS:  Temperature is 97.2, blood pressure  initially 170/90, pulse 59, respiratory rate 18, O2 saturation 97% on  room air.  GENERAL:  She is a well-developed, well-nourished white  female in no acute distress.  HEENT:  Her head is normocephalic and atraumatic with  extraocular  movements intact.  Sclerae clear.  Nares without discharge.  NECK:  There is no lymphadenopathy, thyromegaly or JVD noted.  CVA:  Her heart is regular in rate and rhythm with an S1-S2, and no  significant murmur or gallop is noted.  Distal pulses are 2+ in all 4  extremities and no femoral bruits are appreciated.  LUNGS:  She has a few rales in the bases which decreased with cough.  SKIN:  No rashes or lesions are noted.  ABDOMEN:  Soft and nontender with active bowel sounds and no  hepatosplenomegaly by palpation.  EXTREMITIES:  There is no cyanosis, clubbing or edema noted.  MUSCULOSKELETAL:  There is no joint deformity or effusions and no spine  or CVA tenderness.  NEURO:  She is alert and oriented.  Cranial nerves II-XII grossly  intact.   CHEST X-RAY:  Shows bibasilar atelectasis with stable cardiac  enlargement.  EKG is sinus bradycardia, rate 51 with no acute ischemic  changes and no change from EKG dated March 2007.   LABORATORY VALUES:  Pending, but point of care markers are negative x1.   IMPRESSION:  Chest pain.  [name] had no exertional pain in the last  several days, even during strenuous activity.  Her symptoms are atypical  for angina, but she has risk features, and her symptoms improved with  nitroglycerin.  Esophageal spasm can also cause severe pain and would  also improve with nitroglycerin.  If enzymes are negative, outpatient  Myoview is appropriate for further evaluation, with cath reserved for  high risk study.  If cardiac enzymes are positive, repeat cardiac  catheterization is indicated.  She will otherwise be continued on her  home medications.  We will check a lipid profile on a hemoglobin A1c.  The patient was slightly anxious upon arrival to the emergency room, and  her blood pressure will be followed closely, but per the patient it is  generally is not elevated.      Theodore Demark, PA-C      Learta Codding, MD,FACC   Electronically Signed    RB/MEDQ  D:  08/22/2006  T:  08/22/2006  Job:  [id number]   cc:   Alfredia Client, MD

## 2010-11-28 NOTE — Cardiovascular Report (Signed)
NAME:  [name], [name]                        ACCOUNT NO.:  [id number]   MEDICAL RECORD NO.:  [id number]                   PATIENT TYPE:  INP   LOCATION:  2927                                 FACILITY:  [facility]   PHYSICIAN:  Arturo Morton. Riley Kill, M.D.             DATE OF BIRTH:  [birth year]   DATE OF PROCEDURE:  07/27/2003  DATE OF DISCHARGE:                              CARDIAC CATHETERIZATION   INDICATIONS:  [name] is a 70 year old white female who presents with an  episode of severe chest pain predominantly on Sunday.  She has had recurrent  episodes of chest pain since that time and an abnormal electrocardiogram  suggestive of anterior wall infarction.  She subsequently was referred and  underwent cardiac catheterization by Dr. Antoine Poche demonstrating total  occlusion to the LAD just beyond the take off of the diagonal branch.  The  diagonal itself had about 50-60% ostial narrowing.  We discussed the various  options and at attempt at percutaneous stenting was recommended.   PROCEDURES:  Percutaneous stenting of the left anterior descending artery.   DESCRIPTION OF PROCEDURE:  The patient had undergone previous cardiac  catheterization by Dr. Antoine Poche and was on the table in the catheterization  laboratory.  Dr. Antoine Poche and I both spoke with the patient.  We  subsequently also spoke with the patient's family.  She was agreeable to  proceed with an attempt at percutaneous opening of the LAD inasmuch as her  event was probably a recent event and she had ongoing evidence of continued  recurrent ischemia.  With this, preparations were made for percutaneous  intervention.  Heparin and integrilin were given according to protocol.  The  6 French sheath was exchanged for 7 [place] sheath. A JL-4 guiding catheter  was used to intubate the left main.  The LAD proper at this point was noted  to be now open with subtotal occlusion.  The vessel was crossed with 0.014  Hi-Torque Floppy wire.   Following this, we used the export and did at least  one pass to try to attempt at thrombectomy.  There was no visible clot  removed.  Predilatation was done with 2.25 and 2.5 mm balloons.  Following  this, a 28 x 2.75 Taxus drug-eluting stent was then carefully placed and  positioned.  Multiple views were obtained to document careful positioning.  The stent was deployed at 15 atmospheres for approximately 1 minute.  There  was marked improvement in the appearance of the artery without evidence of  significant distal embolization.  The diagonal still appeared to be intact.  A 3.25 x 8 PowerSail was then taken down and carefully positioned at the  distal end of the stent and a post inflation done at about 6-8 atmospheres.  This was then pulled back and several 10 atmosphere inflations done in the  middle of the stent.  A 6-7 atmosphere inflation was then done overlying the  origin of the diagonal to avoid over dilatation of this area and then the  post dilatation balloon was then pulled back and the proximal end of the  stent was dilated up to 8-9 atmospheres.  There was marked improvement in  the appearance of the artery with TIMI-3 flow, and no evidence of  electrocardiographic distal embolization nor clinical symptoms associated  with completion of the procedure.  The diagonal remained intact.  With this,  all catheters were subsequently removed and the femoral sheath sewn into  place.  The patient tolerated the procedure well and without complication.  ACT was appropriate throughout the course of the procedure.   The LAD proper at this point was noted to be now open with subtotal  occlusion.  The vessel was crossed with 0.014 Hi-Torque Floppy wire.  Following this, we used the export and did at least one pass to try to  attempt at thrombectomy.  There was no visible clot removed.  Pre dilatation  was done with 2.25 and 2.5-mm balloons.  Following this, a 28 x 2.75 Taxus  drug-eluting  stent was then carefully placed and positioned.  Multiple views  were obtained to document careful positioning.  The stent was deployed at 15  atmospheres for approximately 1 minute.  There was marked improvement in the  appearance of the artery without evidence of significant distal  embolization.  The diagonals still appeared  to be intact.  A 3.25 x 8  PowerSail was then taken down and carefully positioned at the distal end of  the stent and a post inflation done at about 6-8 atmospheres.  This one was  then pulled back in several 10 atmosphere inflations done in the middle of  the stent.  A 6-7 atmosphere inflation was then done overlying the origin of  the diagonals to avoid over dilatation of this area and then the post  dilatation balloon was then pulled back and the proximal end of the stent  was dilated up to 8-9 atmospheres.  There was marked improvement in the  appearance of the artery with TIMI-3 flow, and no evidence of  electrocardiographic distal embolization nor clinical symptoms associated  with completion of the procedure.  The diagonal remained intact.  With this,  all catheters were subsequently removed and the femoral sheath sewn into  place.  The patient tolerated the procedure well and without complication.  ACT was appropriate throughout the course of the procedure.   ANGIOGRAPHIC DATA:  1. The left main is long and free of critical disease.  2. The circumflex and intermediate are as described in Dr. Jenene Slicker     report.  3. The mid vessel is totally occluded on the initial diagnostic angiograms.     This was after the diagonal which has 50 to probably 60% ostial     narrowing.  There is diffuse segmental disease leading into this area and     then total occlusion just after the diagonal.  Following the balloon     dilatation and stenting, this area was reduced from 100% down to 0% with    excellent TIMI-3 runoff, a well positioned stent, and no evidence of      distal embolization nor side branch occlusion.   CONCLUSION:  Successful percutaneous stenting of the left anterior  descending artery.   RECOMMENDATIONS:  1. Aspirin and Plavix for approximately one year.  2. Followup echocardiography to assess left ventricular recovery.  3. Beta blockade for blood pressure control.  4.  Statins as appropriate.                                               Arturo Morton. Riley Kill, M.D.    TDS/MEDQ  D:  07/27/2003  T:  07/28/2003  Job:  [id number]   cc:   Magnus Sinning. Dimple Casey, M.D.  [address] [place]  [place] [address]  Fax: [phone]   Rollene Rotunda, M.D.

## 2010-11-28 NOTE — Discharge Summary (Signed)
NAME:  [name], [name]                        ACCOUNT NO.:  [id number]   MEDICAL RECORD NO.:  [id number]                   PATIENT TYPE:  INP   LOCATION:  2001                                 FACILITY:  [facility]   PHYSICIAN:  Pricilla Riffle, M.D.                 DATE OF BIRTH:  12-18-1940   DATE OF ADMISSION:  07/27/2003  DATE OF DISCHARGE:  07/31/2003                                 DISCHARGE SUMMARY   PROCEDURES:  1. Cardiac catheterization.  2. Coronary arteriogram.  3. Left ventriculogram.  4. Percutaneous transluminal coronary angioplasty and stent to one vessel.   HOSPITAL COURSE:  [name] is a 70 year old female with no known history  of coronary artery disease.  She was seen by Dr. Pricilla Riffle for chest  pain in the office on July 27, 2003 and admitted for further evaluation  and treatment.   Her symptoms were concerning for unstable anginal pain with possible out-of-  hospital anterior MI and she was taken to the cath lab on July 27, 2003.   The cardiac catheterization shows an LAD that was 100% stenosed after the  first diagonal, which was a small vessel.  There was no other critical  coronary artery disease and her EF was 50% with akinesis at the anterior and  anteroapical segments.  Dr. Rollene Rotunda felt that percutaneous  intervention was indicated and this was performed by Dr. Arturo Morton. Stuckey.   [name] had PTCA and a TAXUS stent to the LAD, reducing the stenosis to  0 with TIMI-3 flow.  There were no complications and her groin was stable  post procedure.   As part of her evaluation, a lipid profile was checked which showed a total  cholesterol of 180, triglycerides 200, HDL 43, LDL 97.  She had Lipitor  added to her medication regimen and needs followup as an outpatient.   [name] had a beta blocker added to her medication regimen and it was up-  titrated to Lopressor 50 mg b.i.d.  Her systolic blood pressure is generally  90 to 100 and  she tolerated this well.  Additionally, she was seen by  cardiac rehab and ambulated without chest pain of shortness of breath.  She  was given instructions on the use of nitroglycerin, calling 911, cardiac  risk factor reduction and MI restrictions.  She will follow up as an  outpatient with cardiac rehab.   LABORATORY VALUES:  Hemoglobin 12.4, hematocrit 35.1, WBC 6.1, platelets  201,000.  Sodium 136, potassium 3.7, chloride 103, CO2 26, BUN 9, creatinine  0.7, glucose 159.  Calcium 8.6.   Chest x-ray:  No evidence of acute disease.  Heart size is the upper limit  of normal.   DISCHARGE CONDITION:  Improved.   DISCHARGE DIAGNOSES:  1. Acute anterior myocardial infarction, onset of symptoms one week prior to     admission, status post percutaneous transluminal  coronary angioplasty and     TAXUS stent to the left anterior descending, this admission.  2. Mild left ventricular dysfunction with an ejection fraction of 50% by     catheterization, this admission.  3. Dyslipidemia.  4. Hypertension.  5. Diabetes.  6. Intolerance to CODEINE with nausea.  7. Status post gastric bypass x3.  8. History of hiatal hernia.  9. History of peripheral neuropathy secondary to diabetes.  10.      Status post hysterectomy.  11.      Family history of premature coronary artery disease.  12.      Irritable bowel syndrome.   DISCHARGE INSTRUCTIONS:  1. Her activity level is to include walking per rehab guidelines and return     to work per M.D.  2. She is to stick to a low-fat diabetic diet.  3. She is to call the office for problems with the cath site.  4. She is to follow up with Dr. Magnus Sinning. Rice as scheduled.  5. She is to follow up with Dr. Antoine Poche on August 16, 2003 at 11:15 a.m.   DISCHARGE MEDICATIONS:  1. Aspirin 325 mg daily.  2. Multivitamin daily.  3. Plavix 75 mg daily.  4. Lipitor 20 mg daily.  5. Lopressor 50 mg b.i.d.  6. Glucophage XR 500 mg 2 tabs b.i.d.  7.  Nitroglycerin p.r.n.  8. Prilosec 20 mg daily, once the Protonix she has at home runs out.      Theodore Demark, P.A. [facility]                  Pricilla Riffle, M.D.    RB/MEDQ  D:  07/31/2003  T:  08/01/2003  Job:  [id number]   cc:   Magnus Sinning. Dimple Casey, M.D.  [address] [place]  [place] [address]  Fax: [phone]   Rollene Rotunda, M.D.

## 2010-11-28 NOTE — H&P (Signed)
NAME:  [name], [name]                        ACCOUNT NO.:  [id number]   MEDICAL RECORD NO.:  [id number]                   PATIENT TYPE:  INP   LOCATION:  1830                                 FACILITY:  [facility]   PHYSICIAN:  Carole Binning, M.D. [facility]         DATE OF BIRTH:  May 25, 1941   DATE OF ADMISSION:  10/24/2003  DATE OF DISCHARGE:                                HISTORY & PHYSICAL   CHIEF COMPLAINT:  Chest pain.   HISTORY OF PRESENT ILLNESS:  Ms. Beggs is a 70 year old female with a  known history of coronary artery disease who experienced an anterior  myocardial infarction in January 2005, with a subsequent stent placement to  the LAD.  She was awake this morning working around 3 a.m. and began having  some dull chest pain radiating to the left shoulder and arm.  This lasted  approximately one hour.  She did not take sublingual nitroglycerin.  She  eventually fell asleep.  However, during the day today, she has been having  intermittent substernal chest pain.  She went to see Dr. Paulita Cradle at  [facility] and rated her pain a 4/10.  She was given  sublingual nitroglycerin and this decreased the pain to 2/10.  The patient  was transferred to [facility].  She complains of 1/10 chest pain.  The  patient did stop her aspirin secondary to nausea.  The discharge summary  states that she was discharged home on a full strength aspirin, however, the  patient states she was on a baby aspirin at home.   ALLERGIES:  1. CODEINE causes nausea.  2. ASPIRIN causes nausea.   MEDICATIONS:  1. Glucophage 1000 mg b.i.d.  2. Plavix 75 mg daily.  3. Lipitor 20 mg daily.  4. Lopressor 15 mg b.i.d.  5. Multivitamin daily.  6. Calcium 500 mg b.i.d.   PAST MEDICAL HISTORY:  1. History of obesity.  2. History of gastric bypass surgery.  3. Diabetes mellitus with peripheral neuropathy.  4. Hypertension.  5. Hiatal hernia.  6. Irritable bowel syndrome.  7.  Dyslipidemia.  8. Status post total abdominal hysterectomy and cholecystectomy.  9. Coronary artery disease as above.   SOCIAL HISTORY:  Lives in [place] with her husband.  Breeds dogs.  She is  married and has three children.  No tobacco, alcohol, or illicit drug use.  Walks.  Follows a diabetic diet.   FAMILY HISTORY:  Mother with a history of hypertension and diabetes in her  56s.  Father deceased at age 35 secondary to MI.  One sister with no known  problems.   REVIEW OF SYSTEMS:  As above, otherwise negative.  She denies any asthma,  peptic ulcer disease, bloody or melanotic stools.   PHYSICAL EXAMINATION:  VITAL SIGNS:  Temperature 97.5, pulse 60,  respirations 20, blood pressure 141/116 initially, then brought down to  140/96.  GENERAL:  In no acute distress.  HEENT:  Grossly normal.  NECK:  No carotid or subclavian bruits.  No JVD or thyromegaly.  CHEST:  Clear to auscultation bilaterally.  No wheezes, rhonchi, or rales.  HEART:  Regular rate and rhythm.  Normal S1 and S2.  There is an S4 with no  murmurs, rubs, or gallops.  ABDOMEN:  Positive bowel sounds, nontender, nondistended, no masses, no  bruits.  EXTREMITIES:  No peripheral edema.  No clubbing or cyanosis.  No joint  deformities.  SKIN:  There is a well-healed midline abdominal scar secondary to gastric  bypass surgery.  NEUROLOGIC:  Cranial nerves II-XII intact.   LABORATORY DATA:  Chest x-ray pending.   EKG showed normal sinus rhythm, rate 57, with nonspecific ST-T wave changes  in the anterior leads.   Laboratory studies reveal a hemoglobin of 12.6, hematocrit 35.8, platelets  242.  BUN 6, potassium 3.9.  Coagulation studies are normal.   ASSESSMENT:  1. Unstable angina.  2. Coronary artery disease, status post myocardial infarction (anterior),     January 2005, with a Taxus stent to the left anterior descending at that     time.  3. Hyperlipidemia, treated.  4. Hypertension, treated.  5. Obesity,  status post gastric bypass.  6. Diabetes mellitus with peripheral neuropathy, oral agents.  7. Hiatal hernia.  8. Irritable bowel syndrome.   PLAN:  We will go ahead and check the patient's cardiac enzymes.  If they  are positive, she will go for catheterization now.  However, she has just  eaten.  If we can delay her catheterization until morning we feel this will  be beneficial to the patient.  She is essentially currently pain-free.  The  patient was seen and evaluated by Dr. Loraine Leriche Pulsipher.      Guy Franco, P.A. [facility]                      Carole Binning, M.D. [facility]    LB/MEDQ  D:  10/24/2003  T:  10/24/2003  Job:  [id number]   cc:   Rollene Rotunda, M.D.   Magnus Sinning. Dimple Casey, M.D.  [address] [place]  [place] [address]  Fax: [phone]   Dr. Anson F[facility]

## 2010-11-28 NOTE — Cardiovascular Report (Signed)
NAME:  [name], [name]                        ACCOUNT NO.:  [id number]   MEDICAL RECORD NO.:  [id number]                   PATIENT TYPE:  INP   LOCATION:  3741                                 FACILITY:  [facility]   PHYSICIAN:  Charlies Constable, M.D. [facility]              DATE OF BIRTH:  [birth year]   DATE OF PROCEDURE:  02/18/2004  DATE OF DISCHARGE:  02/18/2004                              CARDIAC CATHETERIZATION   PROCEDURE:  Cardiac catheterization.   INDICATIONS FOR PROCEDURE:  The patient is 70 years old, and in January  of  this year suffered an anterior wall myocardial infarction, treated with drug-  alluding stents to the proximal LAD.  She had a cardiac catheterization  performed in April, which showed no evidence of restenosis.  She recently  developed substernal chest pressure and obtained some relief with  nitroglycerin.  She was seen in the office at [facility] and transferred to [facility] for admission,  and scheduled for a cardiac catheterization today.   DESCRIPTION OF PROCEDURE:  The procedure was performed via the right femoral  artery, and arterial sheath and a 6-French preformed coronary catheters.  A  frontal arterial punch was performed and Omnipaque contrast was used.  The  right femoral artery was closed with Angio-Seal at the end of the procedure.  The patient tolerated the procedure well and left the laboratory in  satisfactory condition.   PRESSURES:  Aortic pressure:  125/68 with a mean of 90.  Left ventricular pressure:  125/13.   RESULTS:  1. Left main coronary artery:  The left main coronary artery was free of     significant disease.  2. Left anterior descending coronary artery:  The left anterior descending     coronary artery gave rise to four septal perforators and two diagonal     branches.  There was less than 10% narrowing at the stent site in the     proximal LAD.  3. Circumflex coronary artery:  The  circumflex coronary artery gave rise to     a large ramus branch and two posterolateral branches.  These systems were     free of significant disease.  4. Right coronary artery:  The right coronary artery was a large vessel     which gave rise to two right ventricular branches, a posterior descending     and a posterolateral branch.  There was 60% narrowing in the proximal     right coronary artery.  There was a 60% narrowing in the mid-right     coronary artery which may have been slightly worse than on the previous     study.  LEFT VENTRICULOGRAM:  The left ventriculogram performed in the RAO  projection showed good wall motion with no areas of hypokinesis.  The  estimated ejection fraction was 60%.   CONCLUSION:  Coronary artery disease, status post prior anterior wall  myocardial infarction, treated with a drug-alluding stent in the proximal  left anterior descending coronary artery in January  2005, with less than  10% narrowing at the stent site in the proximal left anterior descending  coronary artery, no significant obstruction in the circumflex coronary  artery, a 50% proximal and 60% mid-stenosis in the right coronary artery,  and normal left ventricular function.   RECOMMENDATIONS:  There does not appear to be any source of ischemia.  The  mid-right lesion appears slightly worse, but does not appear to be flow-  limiting.  Will plan reassurance.  She does have a known hiatal hernia, and  it is possible that this could be related to reflux.  I will give her a  trial of a proton pump inhibitor.                                               Charlies Constable, M.D. [facility]    BB/MEDQ  D:  02/18/2004  T:  02/19/2004  Job:  [id number]   cc:   Dario Guardian, M.D.  [address]  [place]  [place] [address]  Fax: [phone]   Rollene Rotunda, M.D.   Cardiopulmonary Lab - [facility]

## 2010-11-28 NOTE — H&P (Signed)
NAME:  [name], [name]                        ACCOUNT NO.:  [id number]   MEDICAL RECORD NO.:  [id number]                   PATIENT TYPE:  INP   LOCATION:  4733                                 FACILITY:  [facility]   PHYSICIAN:  Learta Codding, M.D. [facility]             DATE OF BIRTH:  [birth year]   DATE OF ADMISSION:  10/24/2003  DATE OF DISCHARGE:  10/26/2003                                HISTORY & PHYSICAL   REASON FOR ADMISSION:  The patient is a 70 year old female with known  coronary artery disease, status post acute anterior myocardial infarction  treated with TAXUS stenting of a 100% occluded LAD in January of this year.  She then was re-hospitalized in April secondary to recurrent chest pain,  ruled out for a myocardial infarction and had a re-look coronary angiogram  revealing less than 20% in stent restenosis with residual 70 to 80% disease  of a small diagonal branch.  There was also some moderate RCA disease and  left ventricular function was normal.  This was followed by an Adenosine  Cardiolite in May of this year revealing normal perfusion, a calculated  ejection fraction of 67%.   The patient was seen in the office today for evaluation of recent, recurrent  chest pain.  She was doing well until yesterday morning when she developed  chest tightness and was seen by her primary care physician.  She was treated  with two nitroglycerin tablets and reported relief in less than five  minutes.  However, she did have recurrent pain last evening requiring  treatment with an additional nitroglycerin tablet.  This morning she had  recurrent chest discomfort and continues to report some residual tightness  at present.  However, she has not taken any nitroglycerin today.  She states  that these symptoms are similar to her two previous hospitalizations.  She  also noted radiation to the left upper extremity today and some mild nausea.  She is reporting some associated dyspnea and states that  her chest  discomfort is worsened with walking.   Electrocardiogram today reveals normal sinus rhythm with normal axes and  nonspecific ST abnormalities.   ALLERGIES:  1. DARVON.  2. CODEINE.   CURRENT MEDICATIONS:  1. Aspirin 81 mg daily.  2. Plavix.  3. Lipitor 20 mg daily.  4. Metoprolol 50 mg b.i.d.  5. Glipizide/metformin two tablets b.i.d.  6. Bactrim one tablet daily (times seven days) - started yesterday.   PAST MEDICAL HISTORY:  1. Coronary artery disease as described above.  2. Type 2 diabetes mellitus with peripheral neuropathy.  3. Hypertension.  4. Dyslipidemia.  5. Gastroesophageal reflux disease.  6. Hiatal hernia.  7. Irritable bowel syndrome.  8. Cholecystectomy.  9. Total hysterectomy.   REVIEW OF SYMPTOMS:  CARDIOPULMONARY:  She denies any recent development of  orthopnea, PND, pedal edema or hemoptysis.  GI/GU:  No hematuria or  hematochezia.  She has occasional heartburn,  but states that current  symptoms of the chest discomfort are not related to this, otherwise as per  history of present illness.  The remaining systems are negative.   SOCIAL HISTORY:  The patient has never smoked tobacco and denies alcohol  use.  The patient is married and has three children.   FAMILY HISTORY:  Father deceased at age 9 secondary to a MI.  Mother has a  history of hypertension and diabetes.   PHYSICAL EXAMINATION:  VITAL SIGNS:  Blood pressure 100/73, pulse 90's and  regular and weight is 186.  GENERAL:  The patient is a 70 year old female in no apparent distress.  HEENT: Normocephalic, atraumatic.  The neck is ballotable.  Carotid pulses  heard without bruits.  LUNGS:  Clear to auscultation in all fields.  HEART:  Regular rate and rhythm (S1 and S2).  There are no murmurs, rubs or  gallops.  ABDOMEN:  Soft and non-tender with intact bowel sounds and no bruits.  EXTREMITIES:  Intact bilateral femoral pulses without bruits; distal pulses  are without edema.   NEUROLOGIC:  No focal deficit.   IMPRESSION:  1. Unstable angina pectoris.  2. Coronary artery disease;     a. Status post anterior myocardial infarction/TAXUS stenting of a 100%        left anterior descending coronary artery in January of 2005     b. Patent stent by cardiac catheterization in April of 2005;     c. Normal Adenosine Cardiolite in May of 2005;     d. Normal left ventricular function.  3. Type 2 diabetes mellitus.  4. Hypertension.  5. Dyslipidemia.  6. Gastroesophageal reflux disease.   PLAN:  1. The patient will be admitted directly to [facility] for further     management and evaluation of symptoms suggestive of unstable angina     pectoris.  2. She will be continued on her aspirin, Plavix, beta blocker and Lipitor.  3. Metformin will be placed on hold and she will be treated with Glipizide     10 mg b.i.d.  4. The patient will also be started on intravenous nitroglycerin and     heparin.  5. If serial cardiac markers are abnormal, then consideration should be     given to the addition of a 2B/3A inhibitor.  6. Proceed with cardiac catheterization on Monday.  The patient is agreeable     with this plan.  The risks/benefits have been discussed and she is     willing to proceed.   The patient was seen and examined in conjunction with Learta Codding, M.D.  [facility].      Gene Serpe, P.A. [facility]                      Learta Codding, M.D. [facility]    GS/MEDQ  D:  02/15/2004  T:  02/15/2004  Job:  [id number]

## 2010-11-28 NOTE — Cardiovascular Report (Signed)
[name], [name]              ACCOUNT NO.:  [id number]   MEDICAL RECORD NO.:  [id number]          PATIENT TYPE:  INP   LOCATION:  2002                         FACILITY:  [facility]   PHYSICIAN:  Arturo Morton. Riley Kill, M.D. [facility] OF BIRTH:  1941-03-08   DATE OF PROCEDURE:  02/10/2005  DATE OF DISCHARGE:                              CARDIAC CATHETERIZATION   INDICATIONS:  [name] is a 70 year old woman who presents with recurrent  chest pain. She previously underwent stenting of the left anterior  descending artery. She has had several cath since then, all demonstrating  some disease in the right coronary artery but only mild progression in  August 2005. Because of recurrent episode of chest pain, she was brought  back to the catheterization laboratory for further evaluation.   PROCEDURE:  1.  Left heart catheterization.  2.  Selective coronary arteriography.  3.  Selective left ventriculography.   DESCRIPTION OF PROCEDURE:  The patient was brought to the catheterization  lab and prepped and draped in usual fashion. Through an anterior puncture,  the right femoral artery was easily entered. A 6-French sheath was placed.  Views of left and right coronaries were obtained in multiple angiographic  projections. Central aortic and left ventricular pressures were measured  with pigtail. Ventriculography was performed in the RAO projection. I then  went to the archive room, and I reviewed all of the previous studies,  particularly with attention paid to the mid right coronary. Decision was  made not to proceed on. She was taken to the holding area in satisfactory  clinical condition.   HEMODYNAMIC DATA.:  1.  Central aortic pressure 166/81.  2.  Left ventricular pressure 168/7.  3.  No gradient on pullback across aortic valve.  4.  Intravenous labetalol was given at the completion of procedure.   ANGIOGRAPHIC DATA.:  1.  Ventriculography was done in the RAO projection. Overall  systolic      function appears to be well within normal range. No segmental      abnormalities contraction were identified.  2.  Proximal aortic root aortography was performed. This revealed no      evidence of significant aortic regurgitation and no evidence of      dissection. We were also looking for a possible anomalous circumflex,      and this was not identified.  3.  The left main coronary was free of critical disease.  4.  Left anterior descending artery courses to the apex. The LAD is      previously stented. There does not appear to be appreciable in-stent      restenosis. There is a small diagonal which exits from the stent site      that demonstrates about 90% ostial narrowing, but it is a small vessel      and is unchanged from the previous study. There is an intermediate      vessel that is tortuous and has some mild luminal irregularity but no      critical stenoses. Likewise, there is an AV circumflex that demonstrates      some mild  narrowing just beyond the bifurcation point, but this is a      small caliber vessel supplying very small amount of myocardium  5.  The right coronary is large dominant vessel. There is about a 60% area      of focal stenosis proximally and a 60% area of mid stenosis. None of      these appear to be critical or high grade. The mean lumen diameter      appears to be adequate for flow. The distal posterior descending and      posterolateral branch were without significant narrowing.   CONCLUSION:  1.  Well-preserved left ventricular function.  2.  Continued patency of the previously placed left anterior descending      stent.  3.  90% stenosis of a small diagonal branch that goes to the previously      placed stent.  4.  Tandem lesions in the right coronary artery which did not appear to be      progressed from the previous study of August 2005.   DISPOSITION:  The patient did have a stress Cardiolite in January. I will  defer to Dr.  Antoine Poche, but my leaning would be in the direction of another  stress test to exclude inducible ischemia. In the absence of this, we would  not recommend intervention at this time.       TDS/MEDQ  D:  02/10/2005  T:  02/10/2005  Job:  [id number]   cc:   Rollene Rotunda, M.D.  [address]  [address]  [place]  [place] [address]   CV Laboratory

## 2010-11-28 NOTE — Discharge Summary (Signed)
NAME:  [name], [name]                        ACCOUNT NO.:  [id number]   MEDICAL RECORD NO.:  [id number]                   PATIENT TYPE:  INP   LOCATION:  4733                                 FACILITY:  [facility]   PHYSICIAN:  Carole Binning, M.D. [facility]         DATE OF BIRTH:  22-Dec-1940   DATE OF ADMISSION:  10/24/2003  DATE OF DISCHARGE:  10/26/2003                                 DISCHARGE SUMMARY   DISCHARGE DIAGNOSES:  1. Admitted with substernal chest pain radiating to the left shoulder and     left arm, persistent for one hour.  The patient was awake at 3 in the     morning, on October 24, 2003, when this happened.  2. History of anterior myocardial infarction, January 2005.  3. Troponin I studies negative x 3, this admission.  4. Electrocardiogram showed a sinus bradycardia with no ST-T changes, no T     wave inversions.  5. Left heart catheterization, October 25, 2003, ejection fraction greater     than 55%.  Recovery of wall motion in the anterior wall.  A less than 20%     in-stent restenosis in the proximal to mid stent.  The stent was placed     July 27, 2003.   SECONDARY DIAGNOSES:  1. History of coronary artery disease, status post anterior myocardial     infarction with stenting of proximal to mid left anterior descending     artery, January 2005.  2. History of gastric bypass.  3. Diabetes with peripheral neuropathy.  4. Hypertension.  5. Hiatal hernia.  6. Irritable bowel syndrome.  7. Dyslipidemia.  8. Status post total hysterectomy.  9. Cholecystectomy.   PROCEDURES:  On, October 25, 2003, left heart catheterization, coronary  arteriography.  The study showed an ejection fraction greater than 55% with  recovery of wall motion in the anterior wall.  The stent placed in the mid  LAD, January 2005, shows less than 20% in-stent restenosis.  There is a 70-  80% ostial stenosis at the small diagonal.  There is a 50-60% proximal  stenosis in the right coronary artery  which is non-flow-limiting.  PCI of  the diagonal is deferred unless there is ischemia by Cardiolite study, or if  the patient has increasing symptoms.  There is likely a medial stent  displacement with side branch, however, the side branch vessel is small.  Plan is to shoot for LDL less than 70 mg/dL.   DISCHARGE DISPOSITION:  [name] is ready for discharge post  procedure day #1, after undergoing left heart catheterization.  The patient  will resume medical therapy as prior to her admission.  Her catheterization  site in the right groin is benign without swelling, erythema, or drainage.  She has good perfusion to the right lower extremity.  Her pain at the  catheterization site is minimal.  No bruit is auscultated.  She has had no  cardiac  dysrhythmias, no respiratory compromise.  She is free of chest pain  at the time of discharge.   She goes home on the following medications:  1. Plavix 75 mg daily.  She should be on concomitant aspirin 81 mg daily but     this has caused nausea and upset stomach.  2. Lopressor 50 mg twice daily.  3. Lipitor 20 mg daily at bedtime.  4. Glucophage 1,000 mg twice daily.  5. Multivitamin daily.  6. Calcium 500 mg twice daily.  7. Nitroglycerin 0.4 mg one tab under the tongue x 3 every five minutes as     needed for chest pain.  For pain management:  1. Tylenol 325 mg 1-2 tabs every four to six hours as needed.   She is to avoid straining of heavy lifting for the next two weeks.  She can  drive beginning Sunday, October 28, 2003.   DISCHARGE DIET:  Low sodium, low cholesterol, ADA diet.   [name] may shower.  She is to call the [facility] Cardiology Office [phone], if she experiences swelling or increased pain at the catheterization  site.  She will visit Dr. Antoine Poche at [facility] on  Friday, Nov 16, 2003 at 11 in the morning.  At that time a Cardiolite study  will be scheduled for her.   BRIEF HISTORY:  Ms.  [name] is a 70 year old female with known  coronary artery disease.  She experienced anterior myocardial infarction,  January 2005, and a stent was placed in the mid LAD which was found, at that  time, to be occluded after the first diagonal.  The first diagonal was  small.  The distal LAD filled predominantly by way of right to left  collaterals.  The patient was awake, on the morning of October 24, 2003 at 3  in the morning, working and began having substernal chest pain which  radiated to the left shoulder and arm.  It lasted about one hour.  She  subsequently fell asleep during the day.  She has been having intermittent  chest pain, and she went to see her primary caregiver, Dr. __________  Bevelyn Buckles at [facility].  At that time, the chest  pain was a 4/10.  Sublingual nitroglycerin reduced it to a 2/10.  Electrocardiogram was taken, and she was transferred to [facility]  where her chest pain was further abated to a 1/10.  She had stopped her  aspirin secondary to nausea.  The patient was admitted to [facility].  Cardiac enzymes were obtained.  These were negative x 3.  Due to  her prior history and onset of chest pain, the patient was scheduled for  left heart catheterization. This was done October 25, 2003.  The study is as  dictated above.  There was a less than 20% in-stent restenosis in the stent  at the mid LAD.  There was a 70-80% ostial stenosis of a very small  diagonal, and a 50-60% proximal stenosis in the right coronary artery which  is not flow limiting.  The patient will be managed medically and will have a  followup with Dr. Antoine Poche and an exercise Cardiolite study to be scheduled  by Dr. Antoine Poche.  PCI deferred unless the patient has increased symptoms or  the Cardiolite study is positive.  The patient was discharged, on October 26, 2003, with the medications and followup as dictated.  LABORATORY STUDIES:  Fasting lipid  profile,  cholesterol is 128,  triglycerides 117, HDL cholesterol is 46, and LDL cholesterol 59.  Her  complete blood count, on October 25, 2003, hemoglobin 12.3, hematocrit 35,  white cells 5.3, platelets 219.  Serum electrolytes, October 24, 2003, sodium  141, potassium 3.9, chloride 107, BUN is 6, creatinine 0.8.      Maple Mirza, P.A.                    Carole Binning, M.D. [facility]   GM/MEDQ  D:  10/26/2003  T:  10/27/2003  Job:  [id number]   cc:   Bevelyn Buckles, Dr.  [facility]   Rollene Rotunda, M.D.

## 2010-11-28 NOTE — Discharge Summary (Signed)
NAME:  [name], [name]                        ACCOUNT NO.:  [id number]   MEDICAL RECORD NO.:  [id number]                   PATIENT TYPE:  INP   LOCATION:  3741                                 FACILITY:  [facility]   PHYSICIAN:  Charlies Constable, M.D. [facility]              DATE OF BIRTH:  06/07/1941   DATE OF ADMISSION:  02/15/2004  DATE OF DISCHARGE:                           DISCHARGE SUMMARY - REFERRING   ADMITTING PHYSICIAN:  Dr. Andee Lineman   DISCHARGING PHYSICIAN:  Dr. Juanda Chance   HISTORY:  [name] is a 70 year old female with known coronary artery  disease and history of an acute anterior myocardial infarction treated with  TAXUS stenting to 100% occluded LAD in January 2005.  She was re-  hospitalized in April with recurrent chest discomfort ruled out myocardial  infarction.  Re-look angiogram showed less than 20% in-stent restenosis with  a residual 70 to 80% disease in small diagonal branch.  EF was 67% by  adenosine Cardiolite in May.  She was seen in the office on the day of  admission for re-occurring chest discomfort.  She developed chest tightness  on the morning of the 4th and was seen by her primary care physician.  She  was initially treated with 2 nitroglycerin tablets and reported relief in  less than 5 minutes.  She had recurring chest discomfort, however, the  evening of the 4th which required additional nitroglycerin.  She continued  to have re-occurring discomfort, thus her admission.   PAST MEDICAL HISTORY:  Also includes type 2 diabetes with peripheral  neuropathy, hypertension, dyslipidemia, GERD, hiatal hernia, irritable bowel  syndrome status post cholecystectomy and hysterectomy.   LABORATORY DATA:  Chest x-ray showed prominent interstitial markings done on  August 6th.  Admission H&H was 12.5 and 35.7, platelets 207, WBCs 4.6.  Subsequent hemoglobin was essentially unremarkable.  Admission PTT was 24,  PT 12.7.  Sodium 135, potassium 3.9, BUN 9, creatinine 0.9,  glucose 217.  Normal LFTs.  CK-MB's relative indices and troponin's were negative x3.  Fasting lipid showed a total cholesterol of 139, triglycerides 22, HDL 55,  LDL 80.  TSH was 1.303.  EKG showed sinus bradycardia with a rate of 57,  normal axis, early R wave, nonspecific ST-T wave changes.   HOSPITAL COURSE:  Ms. [name] was admitted to the unit 3700.  Overnight she  remained on IV heparin.  She ruled out for myocardial infarction.  Over the  weekend, she did not have any further chest discomfort.  On February 18, 2004  she underwent cardiac catheterization by Dr. Juanda Chance.  At her stent site at  the previous LAD lesion she is less than 10% restenosis.  She has a 50%  proximal RCA, 60% mid RCA, EF 60%.  Dr. Juanda Chance felt that he could not  identify a source of ischemia, felt that she could be discharged home with  follow up with Dr. Antoine Poche, and recommended a trial  of Protonix.  Post  procedure and bed rest she was ambulating without difficulty, thus  discharged home.   DISCHARGE DIAGNOSES:  Noncardiac chest discomfort.  Catheterization as  previously described, history as previously.   DISPOSITION:  She is discharged home, asked to continue her coated aspirin  325 mg daily, Plavix 75 mg daily, Lipitor 20 mg q.h.s., Glucotrol 10 mg  b.i.d. and resume her Glucophage on February 20, 2004 as previously, Lopressor  50 mg b.i.d. and received a new prescription for Protonix 40 mg daily.  She  was advised no lifting,  driving, sexual activity, or heavy exertion for 2 days, maintain low  salt/fat/cholesterol diet.  If she has any problems with her catheterization  site, she was asked to call immediately.  She was also asked to call our  office to arrange a follow up appointment with Dr. Antoine Poche.      Joellyn Rued, P.A. [facility]                    Charlies Constable, M.D. [facility]    EW/MEDQ  D:  02/18/2004  T:  02/18/2004  Job:  [id number]   cc:   Rollene Rotunda, M.D.   [facility]

## 2010-11-28 NOTE — H&P (Signed)
[name], [name]              ACCOUNT NO.:  [id number]   MEDICAL RECORD NO.:  [id number]           PATIENT TYPE:   LOCATION:                               FACILITY:  [facility]   PHYSICIAN:  Rollene Rotunda, M.D.   DATE OF BIRTH:  1940-10-28   DATE OF ADMISSION:  09/18/2005  DATE OF DISCHARGE:                                HISTORY & PHYSICAL   PRIMARY CARDIOLOGIST:  Rollene Rotunda, M.D.   PRIMARY CARE PHYSICIAN:  Magnus Sinning. Rice, M.D.   Ms. Jauregui is a 70 year old Caucasian female with known history of coronary  artery disease, status post myocardial infarction in 2005, resulting in a  TAXUS stent to the LAD, also has a history of diabetes, hypertension, and  hyperlipidemia with intolerance to Statins who presented to our office today  complaining of chest pain.  Ms. Shingledecker initially began having chest  discomfort above her left breast at approximately 11 p.m. last night while  watching TV.  Denied any recent physical strain.  She stated it began as a  sharp discomfort.  She initially rated it around a 7.  She took one  nitroglycerin, waited approximately 10 minutes with no relief.  She took  another nitroglycerin, within 20 to 30 minutes the discomfort had eased off  to about a 2.  However, it continued to wax and wane for several hours.  She  finally was able to fall asleep, however, did complain of some nausea with  the chest discomfort.  Denied any other associated symptoms.  She awoke this  morning with same sensation.  However, this time the discomfort was slightly  more intense and more of an ache.  She called her primary care physician's  office in [place], and drove there to be seen.  En route, she took another  nitroglycerin for discomfort.  On arrival to her primary care physician, EKG  was done at that time showing no acute ST-T wave changes, however, we were  notified of the patient's status and the patient was asked to be seen by  cardiology.  Currently, Ms. Plato  is complaining of discomfort in her  chest.  She is rating it a 3 at this time.  Dr. Antoine Poche in to examine this  patient.   ALLERGIES:  1.  DARVON.  2.  STATINS.   CURRENT MEDICATIONS:  1.  Plavix 75 mg daily.  2.  Aspirin 81 mg daily.  3.  Aciphex 20 mg daily.  4.  Lopressor 50 mg p.o. b.i.d.  5.  Metformin 500 mg two tablets p.o. q.a.m. and three tablets q.p.m.  6.  Lisinopril 10 mg daily.  7.  Byetta 5 mg daily.   PAST MEDICAL HISTORY:  1.  Coronary artery disease, status post myocardial infarction in 2005      (acute anterior).  2.  Status post percutaneous transluminal coronary angioplasty with TAXUS      stent to the LAD at that time.  The patient was found to have mild left      ventricular dysfunction with ejection fraction of 50% by cath at that  admission.  Since that time, the patient has had a functional study      done, in January 2006, with evidence of ischemia or infarction.  On February 09, 2005, she had an episode of chest discomfort, underwent a cardiac      catheterization showing patent stent to the LAD at that time with a 90%      lesion in the D-1 which had been present previously.  There were also      minor irregularities in the circumflex and tandem 60% stenosis in the      proximal mid right coronary artery.  The patient is also status post      exercise Myoview, in August 2006, which showed no ischemia at that time.  3.  Dyslipidemia.  4.  Diabetes.  5.  Hypertension.  6.  Irritable bowel syndrome.  7.  GERD.  8.  Status post cholecystectomy.  9.  Status post hysterectomy.  10. History of gastric bypass.  11. Total hysterectomy.   SOCIAL HISTORY:  The patient lives with her husband.  She is retired.  Denies any ETOH, drug, herbal medication, or tobacco use.   REVIEW OF SYSTEMS:  Positive for symptoms as stated in H&P.  Otherwise  negative.   FAMILY HISTORY:  Mother had a history of hypertension and diabetes.  Father  deceased at age 47  secondary to MI.  Siblings without any known CAD.   PHYSICAL EXAMINATION:  VITAL SIGNS:  Blood pressure 110/78, pulse 67, weight  187.  GENERAL:  The patient is in no acute distress at the current time.  HEENT:  Pupils are equal, round, and reactive to light.  Normocephalic  atraumatic.  Sclerae is clear.  NECK:  Supple without lymphadenopathy.  No bruit or JVD.  CARDIOVASCULAR:  Heart rate regular rhythm rate.  S1 S2.  Without murmurs,  rubs, or gallops.  LUNGS:  Clear to auscultation bilateral.  ABDOMEN:  Soft nontender.  Positive bowel sounds.  SKIN:  Warm and dry.  EXTREMITIES:  With 2+ DPs.  Negative for cyanosis, clubbing, or edema.  NEUROLOGIC:  The patient is alert and oriented x3.  Cranial nerves II-XII  grossly intact.   EKG showing sinus rhythm without acute ST-T wave changes.  No lab work  available at this time.   Dr. Antoine Poche in to examined and assess the patient with known coronary  artery disease in the setting of unstable angina.   1.  The patient will be admitted to [facility] for further      evaluation.  2.  Cycle cardiac enzymes.  3.  Anticoagulate with heparin.  4.  The patient will be started on a nitroglycerin drip, titrate for chest      discomfort.  5.  We will continue her current medications with the exemption of      Glucophage.  6.  Definitely, we will continue her Lopressor, aspirin, Plavix, and ACE      inhibitor.      Dorian Pod, NP    ______________________________  Rollene Rotunda, M.D.    MB/MEDQ  D:  09/18/2005  T:  09/18/2005  Job:  [id number]

## 2011-07-14 DIAGNOSIS — G459 Transient cerebral ischemic attack, unspecified: Secondary | ICD-10-CM

## 2011-07-14 HISTORY — DX: Transient cerebral ischemic attack, unspecified: G45.9

## 2012-09-04 DIAGNOSIS — I2510 Atherosclerotic heart disease of native coronary artery without angina pectoris: Secondary | ICD-10-CM | POA: Diagnosis present

## 2012-11-15 ENCOUNTER — Encounter: Payer: Self-pay | Admitting: Gastroenterology

## 2012-11-16 ENCOUNTER — Telehealth: Payer: Self-pay | Admitting: Gastroenterology

## 2012-11-16 NOTE — Telephone Encounter (Signed)
Spoke with patient and she wants to move her OV. States she has had symptoms for a month and wants to get something done. Scheduled with Mike Gip, PA on 11/24/12 at 9:30 AM.

## 2012-11-23 ENCOUNTER — Encounter: Payer: Self-pay | Admitting: *Deleted

## 2012-11-24 ENCOUNTER — Ambulatory Visit (INDEPENDENT_AMBULATORY_CARE_PROVIDER_SITE_OTHER): Payer: Medicare Other | Admitting: Physician Assistant

## 2012-11-24 ENCOUNTER — Encounter: Payer: Self-pay | Admitting: Physician Assistant

## 2012-11-24 ENCOUNTER — Encounter: Payer: Self-pay | Admitting: Gastroenterology

## 2012-11-24 VITALS — BP 136/80 | HR 76 | Ht 68.5 in | Wt 158.4 lb

## 2012-11-24 DIAGNOSIS — K589 Irritable bowel syndrome without diarrhea: Secondary | ICD-10-CM

## 2012-11-24 DIAGNOSIS — Z9861 Coronary angioplasty status: Secondary | ICD-10-CM

## 2012-11-24 DIAGNOSIS — Z9049 Acquired absence of other specified parts of digestive tract: Secondary | ICD-10-CM

## 2012-11-24 DIAGNOSIS — I252 Old myocardial infarction: Secondary | ICD-10-CM

## 2012-11-24 DIAGNOSIS — Z9089 Acquired absence of other organs: Secondary | ICD-10-CM

## 2012-11-24 DIAGNOSIS — Z955 Presence of coronary angioplasty implant and graft: Secondary | ICD-10-CM

## 2012-11-24 DIAGNOSIS — Z1211 Encounter for screening for malignant neoplasm of colon: Secondary | ICD-10-CM

## 2012-11-24 DIAGNOSIS — R634 Abnormal weight loss: Secondary | ICD-10-CM

## 2012-11-24 DIAGNOSIS — I6350 Cerebral infarction due to unspecified occlusion or stenosis of unspecified cerebral artery: Secondary | ICD-10-CM

## 2012-11-24 DIAGNOSIS — I10 Essential (primary) hypertension: Secondary | ICD-10-CM

## 2012-11-24 DIAGNOSIS — I639 Cerebral infarction, unspecified: Secondary | ICD-10-CM

## 2012-11-24 DIAGNOSIS — E119 Type 2 diabetes mellitus without complications: Secondary | ICD-10-CM

## 2012-11-24 DIAGNOSIS — Z9884 Bariatric surgery status: Secondary | ICD-10-CM

## 2012-11-24 MED ORDER — MOVIPREP 100 G PO SOLR: 1.0000 | Freq: Once | ORAL | Status: DC

## 2012-11-24 NOTE — Patient Instructions (Addendum)
We sent the prescription to [facility], [place], [place].  [employer]. For the colonoscopy prep. We will call you after we get a response from Dr. Beverely Pace in [facility] regarding the Plavix. Stay on the aspirin.  You have been scheduled for a colonoscopy with propofol. Please follow written instructions given to you at your visit today.  Please pick up your prep kit at the pharmacy within the next 1-3 days. If you use inhalers (even only as needed), please bring them with you on the day of your procedure. Your physician has requested that you go to www.startemmi.com and enter the access code given to you at your visit today. This web site gives a general overview about your procedure. However, you should still follow specific instructions given to you by our office regarding your preparation for the procedure.

## 2012-11-24 NOTE — Progress Notes (Signed)
Subjective:    Patient ID: [name], female    DOB: [birth year], 72 y.o.   MRN: [id number]  HPI   [name] is a very nice 72 year old white female known to Dr. Christella Hartigan from previous procedures done in 2006. At that time she had undergone an EUS for chronically dilated bile duct. She was felt to have an asymptomatic sphincter of OD stenosis. She is status post cholecystectomy . She is also had a remote gastric [place] for the city [place]. She states that she had a redo about 3 years later in and since had a revision due to stenosis Her history is also pertinent for coronary artery disease, she status post MI in 2005 and had stent placement to the LAD and and says she has had 2 stents placed since then in 2008. She had a CVA in February of 2013 and is now on chronic Plavix and aspirin. She says she has no residual deficits from the CVA. History is also pertinent for hypertension and adult-onset diabetes mellitus. She is referred today for consideration of colonoscopy and also for evaluation of weight loss. She  has not had any prior colonoscopy. She states that she has been having symptoms over the past 2-3 months but that her problems actually started back in October when she was having a lot of problems with headaches. She was seen by a neurologist and started on ibuprofen as well as Topamax. She has gradually had increases in her Topamax up to 200 mg currently she says over the past few months she started noticing of weight loss of a total of about 9 pounds and says she really hasn't felt well over the past 4-5 months in general and has had occasional dizziness as well. The Topamax does seem to be helping headaches but doesn't completely eliminate them. She said she just been seen by an endocrinologist earlier this week to brought up the Topamax as a possible culprit in the weight loss and dizziness. She is currently waiting for instructions from her neurologist regarding decreasing her dose. She  says she has no complaints of abdominal pain no nausea vomiting heartburn or indigestion no dysphagia. Her bowel movements have been fine she has not had any problems with constipation though she does have to strain quite a bit of no melena or hematochezia . Family history is negative for colon cancer. Labs done by Dr. Selena Batten recently showed on 11/11/2012 WBC 3.6 hemoglobin 11.9 hematocrit of 34.7 MCV 96 platelets 170, albumin 4.6 liver function studies within normal limits hemoglobin A1c of 7.   Review of Systems  Constitutional: Positive for appetite change and unexpected weight change.  Eyes: Negative.   Respiratory: Negative.   Cardiovascular: Negative.   Gastrointestinal: Negative.   Endocrine: Negative.   Genitourinary: Negative.   Allergic/Immunologic: Negative.   Neurological: Positive for dizziness and headaches.  Hematological: Negative.   Psychiatric/Behavioral: Negative.    Outpatient Prescriptions Prior to Visit  Medication Sig Dispense Refill  . aspirin 81 MG tablet Take 81 mg by mouth daily. Take 1 tab every other day.      . carvedilol (COREG) 25 MG tablet Take 25 mg by mouth 2 (two) times daily with a meal.      . clopidogrel (PLAVIX) 75 MG tablet Take 75 mg by mouth daily.      Marland Kitchen glimepiride (AMARYL) 1 MG tablet Take 1 mg by mouth daily before breakfast. Take 1 tab with breakfast or the first meal of the day.      Marland Kitchen  ibuprofen (ADVIL,MOTRIN) 600 MG tablet Take 600 mg by mouth every 6 (six) hours as needed for pain. Take 1 tab 3 times daily.      . metFORMIN (GLUCOPHAGE) 1000 MG tablet Take 1,000 mg by mouth 2 (two) times daily with a meal. Take 1.5 Am & 1, PM twice daily.      . nitroGLYCERIN (NITROSTAT) 0.4 MG SL tablet Place 0.4 mg under the tongue every 5 (five) minutes as needed for chest pain. Take 1 tab sublingual under the tongue and allow to dissolve as needed.      . pantoprazole (PROTONIX) 40 MG tablet Take 40 mg by mouth daily. Take 1 tab twice a day.      .  pravastatin (PRAVACHOL) 40 MG tablet Take 40 mg by mouth daily.      . sitaGLIPtin (JANUVIA) 100 MG tablet Take 100 mg by mouth.      . topiramate (TOPAMAX) 25 MG capsule Take 25 mg by mouth 2 (two) times daily. Twice a day.      . carvedilol (COREG) 12.5 MG tablet Take 12.5 mg by mouth.      Marland Kitchen HYDROcodone-acetaminophen (VICODIN) 5-500 MG per tablet Take 1 tablet by mouth every 8 (eight) hours as needed for pain.      Marland Kitchen ibuprofen (ADVIL) 200 MG tablet Take 200 mg by mouth every 8 (eight) hours as needed for pain.      . metoprolol (LOPRESSOR) 100 MG tablet Take 100 mg by mouth 2 (two) times daily. Take 0.5 tab twice daily as needed.       No facility-administered medications prior to visit.   Allergies  Allergen Reactions  . Byetta 5 Mcg Pen (Exenatide) Nausea Only  . Codeine Sulfate   . Lipitor (Atorvastatin)   . Metoprolol Tartrate    Patient Active Problem List   Diagnosis Date Noted  . IBS (irritable bowel syndrome) 11/24/2012  . HTN (hypertension) 11/24/2012  . Diabetes 11/24/2012  . S/P coronary artery stent placement 11/24/2012  . Hx of myocardial infarction 11/24/2012  . CVA (cerebral infarction) 11/24/2012  . H/O gastric bypass 11/24/2012  . S/P cholecystectomy 11/24/2012   History  Substance Use Topics  . Smoking status: Never Smoker   . Smokeless tobacco: Never Used  . Alcohol Use: No   family history includes Diabetes Mellitus II in her mother and Heart attack in her father and sister.     Objective:   Physical Exam  well-developed older white female in no acute distress, pleasant blood pressure 136/80 pulse 76 height 5 foot 8 weight 158. HEENT; nontraumatic normocephalic EOMI PERRLA sclera anicteric, Neck;Supple no JVD, Cardiovascular; regular rate and rhythm with S1-S2 no murmur or gallop, Pulmonary; clear bilaterally, Abdomen; soft flat nontender nondistended bowel sounds are active she does have an upper midline abdominal incisional scar no palpable mass or  hepatosplenomegaly bowel sounds are present, Rectal; exam not done,  Ext; no clubbing cyanosis or edema skin warm and dry  Psych; mood and affect normal and appropriate.        Assessment & Plan:  #59 72 year old female with recent weight loss-after discussion with patient suspect that this is likely medication-induced with Topamax. He is relatively asymptomatic from a GI standpoint other than having anorexia. #2 colon neoplasia surveillance-and should have colonoscopy for screening as she has not had any prior endoscopic surveillance #3 does post remote gastric bypass with redo and subsequent revision #4 coronary artery disease-status post MI 2005, patient has 3 coronary stents in  last done 2008 #5  CVA February 2013 #6 chronic aspirin and Plavix use #7 hypertension #8 adult-onset diabetes mellitus #9 status post cholecystectomy  Plan; will schedule for colonoscopy with Dr. Kathlee Nations discussed in detail with patient and she is agreeable to proceed. If colonoscopy is negative, and patient continues with unexplained weight loss , would consider CT scan of the abdomen and pelvis. She is waiting for visit with her neurologist Dr. Isabell Jarvis in [place] regarding tapering off Topamax or at very least decreasing her dosage.

## 2012-11-24 NOTE — Progress Notes (Signed)
I agree with Amy's note.

## 2012-11-28 ENCOUNTER — Telehealth: Payer: Self-pay | Admitting: *Deleted

## 2012-11-28 NOTE — Telephone Encounter (Signed)
I called pt to inform her that she is to be off the Plavix 5 days prior to the procedure scheduled on 12-16-2012.  I told her to stop the Plavix on 12-11-2012 and resume it on 12-17-2012.

## 2012-12-07 ENCOUNTER — Telehealth: Payer: Self-pay | Admitting: Gastroenterology

## 2012-12-07 DIAGNOSIS — Z1211 Encounter for screening for malignant neoplasm of colon: Secondary | ICD-10-CM

## 2012-12-07 DIAGNOSIS — Z7901 Long term (current) use of anticoagulants: Secondary | ICD-10-CM

## 2012-12-07 NOTE — Telephone Encounter (Signed)
Order placed in EPIC. Called [facility]. They will pre cert then call patient with appointment. Left a message for patient to call me.

## 2012-12-07 NOTE — Telephone Encounter (Signed)
Spoke with patient and she wants to go ahead and cancel the colonoscopy at [facility]. States she will reschedule later in July if the virtual colonoscopy does not work out.

## 2012-12-07 NOTE — Telephone Encounter (Signed)
Virtual colonoscopy is an option- would want to get it precerted  Before scheduling. Can you please try to arrange for pt. She does have multiple comorbidities, and is anticoagulated.   Thanks

## 2012-12-08 NOTE — Telephone Encounter (Signed)
Per Mike Gip, PA patient needs virtual colonoscopy d/t long term anticoagulation and increased risk with sedation

## 2012-12-13 ENCOUNTER — Ambulatory Visit: Payer: Medicare Other | Admitting: Gastroenterology

## 2012-12-13 NOTE — Telephone Encounter (Signed)
Spoke with Alvino Chapel at [facility]. Authorization is pending.

## 2012-12-16 ENCOUNTER — Encounter: Payer: Medicare Other | Admitting: Gastroenterology

## 2012-12-19 NOTE — Telephone Encounter (Signed)
Spoke with Alvino Chapel at [facility] and she will get patient scheduled now that approval has been obtained.

## 2012-12-22 NOTE — Telephone Encounter (Signed)
Virtual colonoscopy scheduled on 01/18/13 per appt. Schedule.

## 2013-01-18 ENCOUNTER — Inpatient Hospital Stay: Admission: RE | Admit: 2013-01-18 | Payer: Medicare Other | Source: Ambulatory Visit

## 2013-01-23 ENCOUNTER — Telehealth: Payer: Self-pay | Admitting: *Deleted

## 2013-01-23 NOTE — Telephone Encounter (Signed)
Left a message for patient to call me. 

## 2013-01-23 NOTE — Telephone Encounter (Signed)
Message copied by Daphine Deutscher on Mon Jan 23, 2013  2:06 PM ------      Message from: Daphine Deutscher      Created: Thu Dec 22, 2012  9:17 AM       Did patient have virtual CT per AE ------

## 2013-01-24 NOTE — Telephone Encounter (Signed)
Left a message for patient to call me. 

## 2013-01-24 NOTE — Telephone Encounter (Signed)
Spoke with patient and she states she did cancel the virtual colonoscopy. She does not want to schedule a colonoscopy or a virtual colonoscopy at this time. She states the problems she had been having was due to medications.

## 2013-01-25 NOTE — Telephone Encounter (Signed)
Ok,noted

## 2013-04-13 ENCOUNTER — Encounter: Payer: Self-pay | Admitting: Nurse Practitioner

## 2013-04-13 ENCOUNTER — Encounter: Payer: Self-pay | Admitting: Gastroenterology

## 2013-04-13 ENCOUNTER — Ambulatory Visit (INDEPENDENT_AMBULATORY_CARE_PROVIDER_SITE_OTHER): Payer: Medicare Other | Admitting: Nurse Practitioner

## 2013-04-13 VITALS — BP 140/70 | HR 60 | Ht 69.0 in | Wt 154.0 lb

## 2013-04-13 DIAGNOSIS — Z1211 Encounter for screening for malignant neoplasm of colon: Secondary | ICD-10-CM | POA: Insufficient documentation

## 2013-04-13 NOTE — Progress Notes (Addendum)
  History of Present Illness:  Patient is a 72 year old female known to Dr. Christella Hartigan. She was evaluated in May of this year for weight loss. He weight has stabilized, she is actually gaining now. Patient attributes the weight loss to high dose Glucophage. She still takes Glucophage but lower dose.  Patient takes Plavix for CAD, s/p MI 2005 / stent placement 2008  Patient here to discuss a screening colonoscopy which she has never had. She was previously scheduled for colonoscopy but postponed it out of fear as she knows two people whose colons were perforated during a colonoscopy. Also, her insurance denied paying for the colonoscopy. She comes ready to proceed with screening colonoscopy assuming insurance will cover it. No GI complaints except  occasional diarrhea which she attributes to IBS.   Patient had a gastric bypass in the 1980's, a redo 3 years later and another revision in 1985.   Current Medications, Allergies, Past Medical History, Past Surgical History, Family History and Social History were reviewed in [employer] record.   Physical Exam: General: pleasant, well developed , white female in no acute distress Head: Normocephalic and atraumatic Eyes:  sclerae anicteric, conjunctiva pink  Ears: Normal auditory acuity Lungs: Clear throughout to auscultation Heart: Regular rate and rhythm Abdomen: Soft, non distended, non-tender. No masses, no hepatomegaly. Normal bowel sounds Musculoskeletal: Symmetrical with no gross deformities  Extremities: No edema  Neurological: Alert oriented x 4, grossly nonfocal Psychological:  Alert and cooperative. Normal mood and affect  Assessment and Recommendations:  1. Colon cancer screening. The risks, benefits, and alternatives to colonoscopy with possible biopsy and possible polypectomy were discussed with the patient and she consents to proceed. Will contact cardiologist IDr. Beverely Pace in [facility]) about holding Plavix  2.  Weight loss, resolved.. Patient tells me her weight loss was secondary to high dose glucophage which has since been reduced.   3. History of gastric bypass in 1980's.   4. Diabetes, on oral agents.

## 2013-04-13 NOTE — Patient Instructions (Addendum)
You have been scheduled for an Colonoscopy with propofol. Please follow written instructions given to you at your visit today. If you use inhalers (even only as needed), please bring them with you on the day of your procedure. Your physician has requested that you go to www.[email or web address].com and enter the access code given to you at your visit today. This web site gives a general overview about your procedure. However, you should still follow specific instructions given to you by our office regarding your preparation for the procedure.                                                We are excited to introduce MyChart, a new best-in-class service that provides you online access to important information in your electronic medical record. We want to make it easier for you to view your health information - all in one secure location - when and where you need it. We expect MyChart will enhance the quality of care and service we provide.  When you register for MyChart, you can:    View your test results.    Request appointments and receive appointment reminders via email.    Request medication renewals.    View your medical history, allergies, medications and immunizations.    Communicate with your physician's office through a password-protected site.    Conveniently print information such as your medication lists.  To find out if MyChart is right for you, please talk to a member of our clinical staff today. We will gladly answer your questions about this free health and wellness tool.  If you are age 45 or older and want a member of your family to have access to your record, you must provide written consent by completing a proxy form available at our office. Please speak to our clinical staff about guidelines regarding accounts for patients younger than age 70.  As you activate your MyChart account and need any technical assistance, please call the MyChart technical support line at [phone]) 83-CHART  [phone]) or email your question to [email or web address]. If you email your question(s), please include your name, a return phone number and the best time to reach you.  If you have non-urgent health-related questions, you can send a message to our office through MyChart at [email or web address]. If you have a medical emergency, call 911.  Thank you for using MyChart as your new health and wellness resource!   MyChart licensed from [employer],  [id number]. Patents Pending.

## 2013-04-13 NOTE — Progress Notes (Signed)
i agree with this plan 

## 2013-05-22 ENCOUNTER — Other Ambulatory Visit: Payer: Medicare Other | Admitting: Gastroenterology

## 2013-05-25 ENCOUNTER — Telehealth: Payer: Self-pay | Admitting: Gastroenterology

## 2013-06-02 ENCOUNTER — Telehealth: Payer: Self-pay

## 2013-06-02 NOTE — Telephone Encounter (Signed)
See alternate note  

## 2013-06-02 NOTE — Telephone Encounter (Signed)
I spoke with Chales Abrahams. Patient canceled her colonoscopy. She did express concern about potential for perforation about on experience of a couple of her friends. Patty will call and discuss swallowing problems. I will be happy to see her back for evaluation of dysphagia.

## 2013-06-02 NOTE — Telephone Encounter (Signed)
Pt was called and she says she does not want to schedule any procedures at this time she will call back if she changes her mind

## 2013-06-02 NOTE — Telephone Encounter (Signed)
Swallowing issues?  Can make appt if needed.

## 2013-06-13 ENCOUNTER — Other Ambulatory Visit: Payer: Medicare Other | Admitting: Gastroenterology

## 2016-10-27 DIAGNOSIS — Z8673 Personal history of transient ischemic attack (TIA), and cerebral infarction without residual deficits: Secondary | ICD-10-CM | POA: Insufficient documentation

## 2016-10-27 DIAGNOSIS — K219 Gastro-esophageal reflux disease without esophagitis: Secondary | ICD-10-CM | POA: Diagnosis present

## 2017-05-31 ENCOUNTER — Encounter (HOSPITAL_COMMUNITY): Payer: Self-pay | Admitting: Emergency Medicine

## 2017-05-31 ENCOUNTER — Inpatient Hospital Stay (HOSPITAL_COMMUNITY)
Admission: EM | Admit: 2017-05-31 | Discharge: 2017-06-02 | DRG: 062 | Disposition: A | Discharge status: Home / Self Care | Payer: Medicare Other | Attending: Neurology | Admitting: Neurology

## 2017-05-31 ENCOUNTER — Emergency Department (HOSPITAL_COMMUNITY): Payer: Medicare Other

## 2017-05-31 ENCOUNTER — Inpatient Hospital Stay (HOSPITAL_COMMUNITY): Payer: Medicare Other

## 2017-05-31 DIAGNOSIS — R29703 NIHSS score 3: Secondary | ICD-10-CM | POA: Diagnosis present

## 2017-05-31 DIAGNOSIS — I10 Essential (primary) hypertension: Secondary | ICD-10-CM | POA: Diagnosis present

## 2017-05-31 DIAGNOSIS — Z79899 Other long term (current) drug therapy: Secondary | ICD-10-CM | POA: Diagnosis not present

## 2017-05-31 DIAGNOSIS — Z833 Family history of diabetes mellitus: Secondary | ICD-10-CM | POA: Diagnosis not present

## 2017-05-31 DIAGNOSIS — Z90710 Acquired absence of both cervix and uterus: Secondary | ICD-10-CM | POA: Diagnosis not present

## 2017-05-31 DIAGNOSIS — G4700 Insomnia, unspecified: Secondary | ICD-10-CM | POA: Diagnosis present

## 2017-05-31 DIAGNOSIS — E785 Hyperlipidemia, unspecified: Secondary | ICD-10-CM | POA: Diagnosis present

## 2017-05-31 DIAGNOSIS — I2510 Atherosclerotic heart disease of native coronary artery without angina pectoris: Secondary | ICD-10-CM | POA: Diagnosis present

## 2017-05-31 DIAGNOSIS — G8929 Other chronic pain: Secondary | ICD-10-CM | POA: Diagnosis present

## 2017-05-31 DIAGNOSIS — I252 Old myocardial infarction: Secondary | ICD-10-CM | POA: Diagnosis not present

## 2017-05-31 DIAGNOSIS — E1159 Type 2 diabetes mellitus with other circulatory complications: Secondary | ICD-10-CM | POA: Diagnosis not present

## 2017-05-31 DIAGNOSIS — Z7984 Long term (current) use of oral hypoglycemic drugs: Secondary | ICD-10-CM | POA: Diagnosis not present

## 2017-05-31 DIAGNOSIS — Z955 Presence of coronary angioplasty implant and graft: Secondary | ICD-10-CM

## 2017-05-31 DIAGNOSIS — I639 Cerebral infarction, unspecified: Secondary | ICD-10-CM | POA: Diagnosis not present

## 2017-05-31 DIAGNOSIS — G459 Transient cerebral ischemic attack, unspecified: Secondary | ICD-10-CM | POA: Diagnosis present

## 2017-05-31 DIAGNOSIS — R4781 Slurred speech: Secondary | ICD-10-CM | POA: Diagnosis present

## 2017-05-31 DIAGNOSIS — Z9884 Bariatric surgery status: Secondary | ICD-10-CM | POA: Diagnosis not present

## 2017-05-31 DIAGNOSIS — Z885 Allergy status to narcotic agent status: Secondary | ICD-10-CM | POA: Diagnosis not present

## 2017-05-31 DIAGNOSIS — Z888 Allergy status to other drugs, medicaments and biological substances status: Secondary | ICD-10-CM

## 2017-05-31 DIAGNOSIS — G8191 Hemiplegia, unspecified affecting right dominant side: Secondary | ICD-10-CM | POA: Diagnosis present

## 2017-05-31 DIAGNOSIS — Z8249 Family history of ischemic heart disease and other diseases of the circulatory system: Secondary | ICD-10-CM | POA: Diagnosis not present

## 2017-05-31 DIAGNOSIS — E119 Type 2 diabetes mellitus without complications: Secondary | ICD-10-CM | POA: Diagnosis present

## 2017-05-31 DIAGNOSIS — M549 Dorsalgia, unspecified: Secondary | ICD-10-CM | POA: Diagnosis present

## 2017-05-31 DIAGNOSIS — Z8673 Personal history of transient ischemic attack (TIA), and cerebral infarction without residual deficits: Secondary | ICD-10-CM

## 2017-05-31 DIAGNOSIS — R001 Bradycardia, unspecified: Secondary | ICD-10-CM | POA: Diagnosis present

## 2017-05-31 DIAGNOSIS — Z7982 Long term (current) use of aspirin: Secondary | ICD-10-CM | POA: Diagnosis not present

## 2017-05-31 DIAGNOSIS — I63312 Cerebral infarction due to thrombosis of left middle cerebral artery: Secondary | ICD-10-CM | POA: Diagnosis not present

## 2017-05-31 LAB — I-STAT CHEM 8, ED
BUN: 18 mg/dL (ref 6–20)
Calcium, Ion: 1.12 mmol/L — ABNORMAL LOW (ref 1.15–1.40)
Chloride: 104 mmol/L (ref 101–111)
Creatinine, Ser: 0.9 mg/dL (ref 0.44–1.00)
Glucose, Bld: 170 mg/dL — ABNORMAL HIGH (ref 65–99)
HCT: 33 % — ABNORMAL LOW (ref 36.0–46.0)
Hemoglobin: 11.2 g/dL — ABNORMAL LOW (ref 12.0–15.0)
Potassium: 4.7 mmol/L (ref 3.5–5.1)
Sodium: 137 mmol/L (ref 135–145)
TCO2: 23 mmol/L (ref 22–32)

## 2017-05-31 LAB — URINALYSIS, ROUTINE W REFLEX MICROSCOPIC
Bilirubin Urine: NEGATIVE
Glucose, UA: NEGATIVE mg/dL
Hgb urine dipstick: NEGATIVE
Ketones, ur: NEGATIVE mg/dL
Leukocytes, UA: NEGATIVE
Nitrite: NEGATIVE
Protein, ur: NEGATIVE mg/dL
Specific Gravity, Urine: 1.009 (ref 1.005–1.030)
pH: 6 (ref 5.0–8.0)

## 2017-05-31 LAB — CBC
HCT: 33.8 % — ABNORMAL LOW (ref 36.0–46.0)
Hemoglobin: 11.3 g/dL — ABNORMAL LOW (ref 12.0–15.0)
MCH: 30.7 pg (ref 26.0–34.0)
MCHC: 33.4 g/dL (ref 30.0–36.0)
MCV: 91.8 fL (ref 78.0–100.0)
Platelets: 179 10*3/uL (ref 150–400)
RBC: 3.68 MIL/uL — ABNORMAL LOW (ref 3.87–5.11)
RDW: 13.5 % (ref 11.5–15.5)
WBC: 4.5 10*3/uL (ref 4.0–10.5)

## 2017-05-31 LAB — COMPREHENSIVE METABOLIC PANEL
ALT: 13 U/L — ABNORMAL LOW (ref 14–54)
AST: 20 U/L (ref 15–41)
Albumin: 3.8 g/dL (ref 3.5–5.0)
Alkaline Phosphatase: 66 U/L (ref 38–126)
Anion gap: 8 (ref 5–15)
BUN: 16 mg/dL (ref 6–20)
CO2: 24 mmol/L (ref 22–32)
Calcium: 9.1 mg/dL (ref 8.9–10.3)
Chloride: 104 mmol/L (ref 101–111)
Creatinine, Ser: 1.04 mg/dL — ABNORMAL HIGH (ref 0.44–1.00)
GFR calc Af Amer: 59 mL/min — ABNORMAL LOW (ref >60)
GFR calc non Af Amer: 51 mL/min — ABNORMAL LOW (ref >60)
Glucose, Bld: 168 mg/dL — ABNORMAL HIGH (ref 65–99)
Potassium: 4.7 mmol/L (ref 3.5–5.1)
Sodium: 136 mmol/L (ref 135–145)
Total Bilirubin: 0.7 mg/dL (ref 0.3–1.2)
Total Protein: 6.4 g/dL — ABNORMAL LOW (ref 6.5–8.1)

## 2017-05-31 LAB — RAPID URINE DRUG SCREEN, HOSP PERFORMED
Amphetamines: NOT DETECTED
Barbiturates: NOT DETECTED
Benzodiazepines: NOT DETECTED
Cocaine: NOT DETECTED
Opiates: NOT DETECTED
Tetrahydrocannabinol: NOT DETECTED

## 2017-05-31 LAB — DIFFERENTIAL
Basophils Absolute: 0 10*3/uL (ref 0.0–0.1)
Basophils Relative: 1 %
Eosinophils Absolute: 0.1 10*3/uL (ref 0.0–0.7)
Eosinophils Relative: 2 %
Lymphocytes Relative: 39 %
Lymphs Abs: 1.8 10*3/uL (ref 0.7–4.0)
Monocytes Absolute: 0.4 10*3/uL (ref 0.1–1.0)
Monocytes Relative: 8 %
Neutro Abs: 2.2 10*3/uL (ref 1.7–7.7)
Neutrophils Relative %: 50 %

## 2017-05-31 LAB — GLUCOSE, CAPILLARY: Glucose-Capillary: 109 mg/dL — ABNORMAL HIGH (ref 65–99)

## 2017-05-31 LAB — ETHANOL: Alcohol, Ethyl (B): <10 mg/dL (ref <10)

## 2017-05-31 LAB — PROTIME-INR
INR: 0.99
Prothrombin Time: 13 seconds (ref 11.4–15.2)

## 2017-05-31 LAB — I-STAT TROPONIN, ED: Troponin i, poc: 0 ng/mL (ref 0.00–0.08)

## 2017-05-31 LAB — MRSA PCR SCREENING: MRSA by PCR: NEGATIVE

## 2017-05-31 LAB — APTT: aPTT: 30 seconds (ref 24–36)

## 2017-05-31 MED ORDER — NITROGLYCERIN 0.4 MG SL SUBL: 0.4000 mg | SUBLINGUAL_TABLET | SUBLINGUAL | Status: DC | PRN

## 2017-05-31 MED ORDER — LABETALOL HCL 5 MG/ML IV SOLN: 5.0000 mg | Freq: Once | INTRAVENOUS | Status: DC

## 2017-05-31 MED ORDER — CARVEDILOL 12.5 MG PO TABS
25.0000 mg | ORAL_TABLET | Freq: Two times a day (BID) | ORAL | Status: DC
  Administered 2017-05-31: 25 mg via ORAL
  Filled 2017-05-31: qty 2

## 2017-05-31 MED ORDER — INSULIN ASPART 100 UNIT/ML ~~LOC~~ SOLN
4.0000 [IU] | Freq: Three times a day (TID) | SUBCUTANEOUS | Status: DC
  Administered 2017-06-01 – 2017-06-02 (×3): 4 [IU] via SUBCUTANEOUS

## 2017-05-31 MED ORDER — NICARDIPINE HCL IN NACL 20-0.86 MG/200ML-% IV SOLN
0.0000 mg/h | INTRAVENOUS | Status: DC
  Administered 2017-05-31: 5 mg/h via INTRAVENOUS
  Filled 2017-05-31 (×2): qty 200

## 2017-05-31 MED ORDER — ACETAMINOPHEN 325 MG PO TABS
650.0000 mg | ORAL_TABLET | ORAL | Status: DC | PRN
  Administered 2017-05-31: 650 mg via ORAL
  Filled 2017-05-31: qty 2

## 2017-05-31 MED ORDER — SENNOSIDES-DOCUSATE SODIUM 8.6-50 MG PO TABS: 1.0000 | ORAL_TABLET | Freq: Every evening | ORAL | Status: DC | PRN

## 2017-05-31 MED ORDER — STROKE: EARLY STAGES OF RECOVERY BOOK
Freq: Once | Status: DC
  Filled 2017-05-31: qty 1

## 2017-05-31 MED ORDER — LOSARTAN POTASSIUM 50 MG PO TABS
25.0000 mg | ORAL_TABLET | Freq: Every day | ORAL | Status: DC
  Administered 2017-05-31: 25 mg via ORAL
  Filled 2017-05-31 (×2): qty 1

## 2017-05-31 MED ORDER — PRAVASTATIN SODIUM 40 MG PO TABS
40.0000 mg | ORAL_TABLET | Freq: Every day | ORAL | Status: DC
  Administered 2017-05-31: 40 mg via ORAL
  Filled 2017-05-31: qty 1

## 2017-05-31 MED ORDER — ACETAMINOPHEN 160 MG/5ML PO SOLN: 650.0000 mg | ORAL | Status: DC | PRN

## 2017-05-31 MED ORDER — PANTOPRAZOLE SODIUM 40 MG IV SOLR
40.0000 mg | Freq: Every day | INTRAVENOUS | Status: DC
  Administered 2017-05-31: 40 mg via INTRAVENOUS
  Filled 2017-05-31: qty 40

## 2017-05-31 MED ORDER — ALTEPLASE (STROKE) FULL DOSE INFUSION
0.9000 mg/kg | Freq: Once | INTRAVENOUS | Status: AC
  Administered 2017-05-31: 68 mg via INTRAVENOUS

## 2017-05-31 MED ORDER — INSULIN ASPART 100 UNIT/ML ~~LOC~~ SOLN: 0.0000 [IU] | Freq: Every day | SUBCUTANEOUS | Status: DC

## 2017-05-31 MED ORDER — ACETAMINOPHEN 325 MG PO TABS: 650.0000 mg | ORAL_TABLET | ORAL | Status: DC | PRN

## 2017-05-31 MED ORDER — ACETAMINOPHEN 650 MG RE SUPP: 650.0000 mg | RECTAL | Status: DC | PRN

## 2017-05-31 MED ORDER — INSULIN ASPART 100 UNIT/ML ~~LOC~~ SOLN
0.0000 [IU] | Freq: Three times a day (TID) | SUBCUTANEOUS | Status: DC
  Administered 2017-06-01: 5 [IU] via SUBCUTANEOUS
  Administered 2017-06-01 – 2017-06-02 (×2): 3 [IU] via SUBCUTANEOUS

## 2017-05-31 MED ORDER — NICARDIPINE HCL IN NACL 20-0.86 MG/200ML-% IV SOLN
0.0000 mg/h | INTRAVENOUS | Status: DC
  Administered 2017-05-31: 5 mg/h via INTRAVENOUS

## 2017-05-31 MED ORDER — PANTOPRAZOLE SODIUM 40 MG IV SOLR: 40.0000 mg | Freq: Every day | INTRAVENOUS | Status: DC

## 2017-05-31 MED ORDER — SODIUM CHLORIDE 0.9 % IV SOLN: INTRAVENOUS | Status: DC

## 2017-05-31 MED ORDER — SODIUM CHLORIDE 0.9 % IV SOLN
INTRAVENOUS | Status: DC
  Administered 2017-05-31: 21:00:00 via INTRAVENOUS

## 2017-05-31 MED ORDER — LABETALOL HCL 5 MG/ML IV SOLN
10.0000 mg | Freq: Once | INTRAVENOUS | Status: AC
  Administered 2017-05-31: 10 mg via INTRAVENOUS

## 2017-05-31 MED ORDER — IOPAMIDOL (ISOVUE-370) INJECTION 76%
INTRAVENOUS | Status: AC
  Administered 2017-05-31: 50 mL
  Filled 2017-05-31: qty 50

## 2017-05-31 NOTE — ED Provider Notes (Signed)
[place] [facility] EMERGENCY DEPARTMENT Provider Note   CSN: [id number] Arrival date & time: 05/31/17  1253   An emergency department physician performed an initial assessment on this suspected stroke patient at 1253(nanavati).  History   Chief Complaint Chief Complaint  Patient presents with  . Code Stroke    HPI KENIDI ELENBAAS is a 76 y.o. female.  HPI  Pt presenting as a code stroke notifitication.  approx noon today she had decreased use of her right hand, also difficulty with her speech.  Upon arrival to the ED she went directly to CT scan.  Neurology ordered TPA.  Upon my evaluation her symptoms are much improved.  No difficulty with her speech and has regained use of her right hand.    Past Medical History:  Diagnosis Date  . Chronic back pain    S/P MRI 2011L5-S1 large left paracentral disc herniation  . DM2 (diabetes mellitus, type 2) ([place])   . Headache(784.0)    Carotid US/ Negative/[facility]  . Hiatal hernia   . Hyperlipidemia   . Hypertension   . Insomnia   . MI (myocardial infarction) ([place]) 2005   P stent x3 in 2008  . TIA (transient ischemic attack) 2013   08-25-2011, 09-06-2011  . Vascular migraine     Patient Active Problem List   Diagnosis Date Noted  . CVA (cerebral vascular accident) ([place]) 05/31/2017  . Colon cancer screening 04/13/2013  . IBS (irritable bowel syndrome) 11/24/2012  . HTN (hypertension) 11/24/2012  . Diabetes ([place]) 11/24/2012  . S/P coronary artery stent placement 11/24/2012  . Hx of myocardial infarction 11/24/2012  . CVA (cerebral infarction) 11/24/2012  . H/O gastric bypass 11/24/2012  . S/P cholecystectomy 11/24/2012    Past Surgical History:  Procedure Laterality Date  . ANTERIOR AND POSTERIOR VAGINAL REPAIR    . BACK SURGERY  07/31/2010   10/30/2010  . CYSTOCELE REPAIR  12/03/2008  . Gastric bypass surgery  1984  . SQUAMOUS CELL CARCINOMA EXCISION     Removed from temple and lt eyebrow.  Marland Kitchen  TOTAL ABDOMINAL HYSTERECTOMY    . Tummy tuck  1984  . Unilateral salpingoophorectomy  1983    OB History    No data available       Home Medications    Prior to Admission medications   Medication Sig Start Date End Date Taking? Authorizing Provider  aspirin 81 MG tablet Take 81 mg by mouth daily. Take 1 tab every other day.   Yes [provider]  carvedilol (COREG) 25 MG tablet Take 25 mg by mouth 2 (two) times daily with a meal.   Yes [provider]  glimepiride (AMARYL) 4 MG tablet Take 4 mg 2 (two) times daily by mouth.   Yes [provider]  ibuprofen (ADVIL,MOTRIN) 600 MG tablet Take 600 mg by mouth every 6 (six) hours as needed for pain. Take 1 tab 3 times daily.   Yes [provider]  losartan (COZAAR) 25 MG tablet Take 25 mg daily by mouth.   Yes [provider]  metFORMIN (GLUCOPHAGE) 500 MG tablet Take 500 mg 2 (two) times daily with a meal by mouth.   Yes [provider]  nitroGLYCERIN (NITROSTAT) 0.4 MG SL tablet Place 0.4 mg under the tongue every 5 (five) minutes as needed for chest pain. Take 1 tab sublingual under the tongue and allow to dissolve as needed.   Yes [provider]  pantoprazole (PROTONIX) 40 MG tablet Take 40  mg 2 (two) times daily by mouth. Take 1 tab twice a day.    Yes [provider]  pravastatin (PRAVACHOL) 40 MG tablet Take 40 mg by mouth daily.   Yes [provider]  MOVIPREP 100 G SOLR Take 1 kit (100 g total) by mouth once. "Pharmacist please use BIN: [id number] GROUP: [id number] ID: [id number] Call [phone] for pharmacy questions "Pt will save $10" Patient not taking: Reported on 05/31/2017 11/24/12   Alfredia Ferguson, PA-C    Family History Family History  Problem Relation Age of Onset  . Heart attack Father        Died at 37  . Heart attack Sister   . Diabetes Mellitus II Mother     Social History Social History   Tobacco Use  . Smoking status:  Never Smoker  . Smokeless tobacco: Never Used  Substance Use Topics  . Alcohol use: No  . Drug use: No     Allergies   Byetta 5 mcg pen [exenatide]; Codeine sulfate; Metoprolol tartrate; and Lipitor [atorvastatin]   Review of Systems Review of Systems  ROS reviewed and all otherwise negative except for mentioned in HPI   Physical Exam Updated Vital Signs BP (!) 149/73   Pulse 62   Temp 98 F (36.7 C) (Oral)   Resp 18   Wt 75.9 kg (167 lb 5.3 oz)   SpO2 99%   BMI 24.71 kg/m  Vitals reviewed Physical Exam  Physical Examination: General appearance - alert, well appearing, and in no distress Mental status - alert, oriented to person, place, and time Eyes - no conjunctival injection, no scleral icterus, PERRL, EOMI Mouth - mucous membranes moist, pharynx normal without lesions Chest - clear to auscultation, no wheezes, rales or rhonchi, symmetric air entry Heart - normal rate, regular rhythm, normal S1, S2, no murmurs, rubs, clicks or gallops Neurological - alert, oriented x 3, normal speech, no facial droop, no cranial nerve deficit, strength 4+/5 in right hand, otherwise 5/5 Extremities - peripheral pulses normal, no pedal edema, no clubbing or cyanosis Skin - normal coloration and turgor, no rashes   ED Treatments / Results  Labs (all labs ordered are listed, but only abnormal results are displayed) Labs Reviewed  CBC - Abnormal; Notable for the following components:      Result Value   RBC 3.68 (*)    Hemoglobin 11.3 (*)    HCT 33.8 (*)    All other components within normal limits  COMPREHENSIVE METABOLIC PANEL - Abnormal; Notable for the following components:   Glucose, Bld 168 (*)    Creatinine, Ser 1.04 (*)    Total Protein 6.4 (*)    ALT 13 (*)    GFR calc non Af Amer 51 (*)    GFR calc Af Amer 59 (*)    All other components within normal limits  I-STAT CHEM 8, ED - Abnormal; Notable for the following components:   Glucose, Bld 170 (*)    Calcium, Ion  1.12 (*)    Hemoglobin 11.2 (*)    HCT 33.0 (*)    All other components within normal limits  ETHANOL  PROTIME-INR  APTT  DIFFERENTIAL  RAPID URINE DRUG SCREEN, HOSP PERFORMED  URINALYSIS, ROUTINE W REFLEX MICROSCOPIC  I-STAT TROPONIN, ED    EKG  EKG Interpretation  Date/Time:  Monday May 31 2017 13:55:18 EST Ventricular Rate:  65 PR Interval:    QRS Duration: 135 QT Interval:  436 QTC Calculation: 454 R Axis:  17 Text Interpretation:  Sinus rhythm Right bundle branch block No significant change since last tracing Confirmed by Alfonzo Beers [phone]) on 05/31/2017 2:14:32 PM       Radiology Ct Angio Head W Or Wo Contrast  Result Date: 05/31/2017 CLINICAL DATA:  76 y/o F; code stroke, patient given tPA. Right-sided weakness. EXAM: CT ANGIOGRAPHY HEAD AND NECK TECHNIQUE: Multidetector CT imaging of the head and neck was performed using the standard protocol during bolus administration of intravenous contrast. Multiplanar CT image reconstructions and MIPs were obtained to evaluate the vascular anatomy. Carotid stenosis measurements (when applicable) are obtained utilizing NASCET criteria, using the distal internal carotid diameter as the denominator. CONTRAST:  58m ISOVUE-370 IOPAMIDOL (ISOVUE-370) INJECTION 76% COMPARISON:  05/31/2017 CT head. FINDINGS: CTA NECK FINDINGS Aortic arch: Standard branching. Imaged portion shows no evidence of aneurysm or dissection. No significant stenosis of the major arch vessel origins. Mild calcific atherosclerosis. Right carotid system: No evidence of dissection, stenosis (50% or greater) or occlusion. Mild non stenotic calcific atherosclerosis of carotid bifurcation. Left carotid system: No evidence of dissection, stenosis (50% or greater) or occlusion. Minimal non stenotic calcific atherosclerosis of carotid bifurcation. Vertebral arteries: Dense calcified plaque of left vertebral artery origin with probable at least underlying moderate  stenosis, suboptimal assessment of luminal stenosis due to dense calcification. Dense calcified plaque of right vertebral artery origin with mild to moderate 50% stenosis. Codominant vertebral arteries. No dissection or aneurysm. Skeleton: Moderate cervical spondylosis with reversal of curvature at the C5 level and discogenic degenerative changes greatest at C5-6. No high-grade bony foraminal or canal stenosis. Other neck: Multiple thyroid nodules measuring up to 17 mm in the mid right lobe (series 8, image 118). Upper chest: Negative. Review of the MIP images confirms the above findings CTA HEAD FINDINGS Anterior circulation: Mild right and moderate left calcific atherosclerosis of the carotid siphons. Moderate left paraclinoid stenosis. No significant right stenosis. No large vessel occlusion, aneurysm, or significant stenosis of bilateral ACA or MCA identified. Posterior circulation: Dense calcified plaque of left V4 segment proximal to PICA origin with severe stenosis. Left V4 segment calcified plaque just downstream to PICA origin with mild to moderate 50% stenosis. Widely patent right V4 segment. Right fetal PCA. No significant stenosis, aneurysm, or occlusion of the basilar artery or bilateral PCA. Venous sinuses: As permitted by contrast timing, patent. Anatomic variants: Fetal right PCA. Small left posterior communicating artery. No anterior communicating artery identified, likely hypoplastic or absent. Other: Calcified dural nodule in the midline overlying frontal lobes measuring 10 x 11 mm (series 7, image 69) which may represent a meningioma. Review of the MIP images confirms the above findings IMPRESSION: CTA neck: 1. No evidence of dissection, stenosis (50% or greater) or occlusion of the carotid arteries in the neck. 2. Bilateral vertebral artery origin dense calcified plaque with 50% mild-to-moderate right and at least moderate left stenosis. 3. Thyroid nodules measuring to 17 mm in the right lobe,  thyroid ultrasound is recommended on a nonemergent basis to further evaluate. CTA head: 1. No large vessel occlusion or aneurysm identified. 2. Left V4 segment tandem calcified plaques with up to severe stenosis. 3. Bilateral carotid siphon calcified plaque with moderate left paraclinoid stenosis. No significant right stenosis. 4. Calcified frontal midline dural nodule measuring 11 mm, possible meningioma. These results were text paged at the time of interpretation on 05/31/2017 at 2:15 pm to Dr. EKerney Elbe. Electronically Signed   By: LKristine GarbeM.D.   On: 05/31/2017 14:16  Ct Angio Neck W Or Wo Contrast  Result Date: 05/31/2017 CLINICAL DATA:  76 y/o F; code stroke, patient given tPA. Right-sided weakness. EXAM: CT ANGIOGRAPHY HEAD AND NECK TECHNIQUE: Multidetector CT imaging of the head and neck was performed using the standard protocol during bolus administration of intravenous contrast. Multiplanar CT image reconstructions and MIPs were obtained to evaluate the vascular anatomy. Carotid stenosis measurements (when applicable) are obtained utilizing NASCET criteria, using the distal internal carotid diameter as the denominator. CONTRAST:  64m ISOVUE-370 IOPAMIDOL (ISOVUE-370) INJECTION 76% COMPARISON:  05/31/2017 CT head. FINDINGS: CTA NECK FINDINGS Aortic arch: Standard branching. Imaged portion shows no evidence of aneurysm or dissection. No significant stenosis of the major arch vessel origins. Mild calcific atherosclerosis. Right carotid system: No evidence of dissection, stenosis (50% or greater) or occlusion. Mild non stenotic calcific atherosclerosis of carotid bifurcation. Left carotid system: No evidence of dissection, stenosis (50% or greater) or occlusion. Minimal non stenotic calcific atherosclerosis of carotid bifurcation. Vertebral arteries: Dense calcified plaque of left vertebral artery origin with probable at least underlying moderate stenosis, suboptimal assessment of  luminal stenosis due to dense calcification. Dense calcified plaque of right vertebral artery origin with mild to moderate 50% stenosis. Codominant vertebral arteries. No dissection or aneurysm. Skeleton: Moderate cervical spondylosis with reversal of curvature at the C5 level and discogenic degenerative changes greatest at C5-6. No high-grade bony foraminal or canal stenosis. Other neck: Multiple thyroid nodules measuring up to 17 mm in the mid right lobe (series 8, image 118). Upper chest: Negative. Review of the MIP images confirms the above findings CTA HEAD FINDINGS Anterior circulation: Mild right and moderate left calcific atherosclerosis of the carotid siphons. Moderate left paraclinoid stenosis. No significant right stenosis. No large vessel occlusion, aneurysm, or significant stenosis of bilateral ACA or MCA identified. Posterior circulation: Dense calcified plaque of left V4 segment proximal to PICA origin with severe stenosis. Left V4 segment calcified plaque just downstream to PICA origin with mild to moderate 50% stenosis. Widely patent right V4 segment. Right fetal PCA. No significant stenosis, aneurysm, or occlusion of the basilar artery or bilateral PCA. Venous sinuses: As permitted by contrast timing, patent. Anatomic variants: Fetal right PCA. Small left posterior communicating artery. No anterior communicating artery identified, likely hypoplastic or absent. Other: Calcified dural nodule in the midline overlying frontal lobes measuring 10 x 11 mm (series 7, image 69) which may represent a meningioma. Review of the MIP images confirms the above findings IMPRESSION: CTA neck: 1. No evidence of dissection, stenosis (50% or greater) or occlusion of the carotid arteries in the neck. 2. Bilateral vertebral artery origin dense calcified plaque with 50% mild-to-moderate right and at least moderate left stenosis. 3. Thyroid nodules measuring to 17 mm in the right lobe, thyroid ultrasound is recommended on  a nonemergent basis to further evaluate. CTA head: 1. No large vessel occlusion or aneurysm identified. 2. Left V4 segment tandem calcified plaques with up to severe stenosis. 3. Bilateral carotid siphon calcified plaque with moderate left paraclinoid stenosis. No significant right stenosis. 4. Calcified frontal midline dural nodule measuring 11 mm, possible meningioma. These results were text paged at the time of interpretation on 05/31/2017 at 2:15 pm to Dr. EKerney Elbe. Electronically Signed   By: LKristine GarbeM.D.   On: 05/31/2017 14:16   Ct Head Code Stroke Wo Contrast  Result Date: 05/31/2017 CLINICAL DATA:  Code stroke. 76year old female with right side weakness. Last seen normal at noon. EXAM: CT HEAD WITHOUT CONTRAST  TECHNIQUE: Contiguous axial images were obtained from the base of the skull through the vertex without intravenous contrast. COMPARISON:  Head CT 09/07/2014 and brain MRI 06/07/2012 FINDINGS: Brain: Stable cerebral volume since 2016. No midline shift, ventriculomegaly, mass effect, evidence of mass lesion, intracranial hemorrhage or evidence of cortically based acute infarction. Gray-white matter differentiation is within normal limits throughout the brain. Vascular: Calcified atherosclerosis at the skull base. No suspicious intracranial vascular hyperdensity. Skull: Stable and negative. Sinuses/Orbits: Visualized paranasal sinuses and mastoids are stable and well pneumatized. Other: Stable and negative orbit and scalp soft tissues. ASPECTS ([place] Stroke Program Early CT Score) - Ganglionic level infarction (caudate, lentiform nuclei, internal capsule, insula, M1-M3 cortex): 7 - Supraganglionic infarction (M4-M6 cortex): 3 Total score (0-10 with 10 being normal): 10 IMPRESSION: 1. Stable and normal for age noncontrast CT appearance of the brain. 2. ASPECTS is 10. 3. The above was relayed via text pager to Dr. Bruce Donath on 05/31/2017 at 13:11 . Electronically Signed   By:  Genevie Ann M.D.   On: 05/31/2017 13:11    Procedures Procedures (including critical care time)  Medications Ordered in ED Medications  nicardipine (CARDENE) '20mg'$  in 0.86% saline 265m IV infusion (0.1 mg/ml) (0 mg/hr Intravenous Stopped 05/31/17 1418)  iopamidol (ISOVUE-370) 76 % injection (50 mLs  Contrast Given 05/31/17 1330)  alteplase (ACTIVASE) 1 mg/mL infusion 68 mg (0 mg/kg  75.9 kg Intravenous Stopped 05/31/17 1426)  labetalol (NORMODYNE,TRANDATE) injection 10 mg (10 mg Intravenous Given 05/31/17 1310)     Initial Impression / Assessment and Plan / ED Course  I have reviewed the triage vital signs and the nursing notes.  Pertinent labs & imaging results that were available during my care of the patient were reviewed by me and considered in my medical decision making (see chart for details).     Pt with acute onset of stroke symptoms at noon today.  Seen as a code stroke, neurology initiated TPA, also started on cardene drip for hypertension.  Symptoms resolved almost completely at time of my evaluation after TPA.  Pt admitted to neurology service.    Final Clinical Impressions(s) / ED Diagnoses   Final diagnoses:  Cerebrovascular accident (CVA), unspecified mechanism ([facility]    ED Discharge Orders    None       Mabe, MForbes Cellar MD 05/31/17 [phone]

## 2017-05-31 NOTE — H&P (Signed)
Admission H&P    Chief Complaint: Acute onset of slurred speech and right sided weakness  HPI: [name] is an 76 y.o. female who presented to the ED with acute onset of right hand weakness and slurred speech. She is from [facility] and is visiting family in [place]. Endorsed not being able to grasp an object with her right hand, in addition to not being able to say the words she was trying to say. Symptoms began at noon, which is also her last known normal. She is not on a blood thinner and has no history of ICH or other intracranial lesion. She takes ASA 81 mg daily, as well as pravastatin 40 mg qd. She is allergic to Lipitor.   Her stroke risk factors include DM2, HLD, HTN, prior MI and prior TIA.   LSN: Noon tPA Given: Yes NIHSS: 3   Past Medical History:  Diagnosis Date  . Chronic back pain    S/P MRI 2011L5-S1 large left paracentral disc herniation  . DM2 (diabetes mellitus, type 2) ([place])   . Headache(784.0)    Carotid US/ Negative/[facility]  . Hiatal hernia   . Hyperlipidemia   . Hypertension   . Insomnia   . MI (myocardial infarction) ([place]) 2005   P stent x3 in 2008  . TIA (transient ischemic attack) 2013   08-25-2011, 09-06-2011  . Vascular migraine     Past Surgical History:  Procedure Laterality Date  . ANTERIOR AND POSTERIOR VAGINAL REPAIR    . BACK SURGERY  07/31/2010   10/30/2010  . CYSTOCELE REPAIR  12/03/2008  . Gastric bypass surgery  1984  . SQUAMOUS CELL CARCINOMA EXCISION     Removed from temple and lt eyebrow.  Marland Kitchen TOTAL ABDOMINAL HYSTERECTOMY    . Tummy tuck  1984  . Unilateral salpingoophorectomy  1983    Family History  Problem Relation Age of Onset  . Heart attack Father        Died at 15  . Heart attack Sister   . Diabetes Mellitus II Mother    Social History:  reports that  has never smoked. she has never used smokeless tobacco. She reports that she does not drink alcohol or use drugs.  Allergies:  Allergies  Allergen  Reactions  . Byetta 5 Mcg Pen [Exenatide] Nausea Only  . Codeine Sulfate Other (See Comments)  . Metoprolol Tartrate Cough  . Lipitor [Atorvastatin] Palpitations    Felt like heart attack   Current Meds  Medication Sig  . aspirin 81 MG tablet Take 81 mg by mouth daily. Take 1 tab every other day.  . carvedilol (COREG) 25 MG tablet Take 25 mg by mouth 2 (two) times daily with a meal.  . glimepiride (AMARYL) 4 MG tablet Take 4 mg 2 (two) times daily by mouth.  Marland Kitchen ibuprofen (ADVIL,MOTRIN) 600 MG tablet Take 600 mg by mouth every 6 (six) hours as needed for pain. Take 1 tab 3 times daily.  Marland Kitchen losartan (COZAAR) 25 MG tablet Take 25 mg daily by mouth.  . metFORMIN (GLUCOPHAGE) 500 MG tablet Take 500 mg 2 (two) times daily with a meal by mouth.  . nitroGLYCERIN (NITROSTAT) 0.4 MG SL tablet Place 0.4 mg under the tongue every 5 (five) minutes as needed for chest pain. Take 1 tab sublingual under the tongue and allow to dissolve as needed.  . pantoprazole (PROTONIX) 40 MG tablet Take 40 mg 2 (two) times daily by mouth. Take 1 tab twice a day.   Marland Kitchen  pravastatin (PRAVACHOL) 40 MG tablet Take 40 mg by mouth daily.     (Not in a hospital admission)  ROS: Denies headache. Other ROS as per HPI.   Physical Examination: Blood pressure (!) 149/73, pulse 62, temperature 98 F (36.7 C), temperature source Oral, resp. rate 18, weight 75.9 kg (167 lb 5.3 oz), SpO2 99 %.  HEENT-  [place]/AT  Lungs - Respirations unlabored Extremities - No edema  Neurologic Examination: Mental Status: Awake, anxious appearing, fully oriented. Speech hypophonic without dysarthria. Comprehension, naming and fluency are intact. Able to follow a 2-step directional command without difficulty.  Cranial Nerves:  II:  Homonymous right superior quadrantanopsia. PERRL. III,IV, VI: EOMI in the context of saccadic smooth pursuits. No nystagmus. No ptosis noted.  V,VII: No facial droop at baseline or with smile/grimace. Temp sensation  intact bilaterally VIII: hearing intact to voice.  IX,X: Palate rises symmetrically XI: Symmetric shoulder shrug XII: midline tongue extension  Motor: RUE: 5/5 RLE: 4+/5. Drifts downwards without hitting bed. LUE and LLE: 5/5 Tone and bulk normal x 4 Sensory: Decreased temp sensation to RUE and RLE. No extinction to DSS Deep Tendon Reflexes:  2+ bilateral brachioradialis, biceps, patellar reflexes. 1+ achilles bilaterally.  Plantars: Right: downgoing  Left: downgoing Cerebellar: No ataxia with FNF bilaterally.  Gait: Deferred  Results for orders placed or performed during the hospital encounter of 05/31/17 (from the past 48 hour(s))  Ethanol     Status: None   Collection Time: 05/31/17 12:55 PM  Result Value Ref Range   Alcohol, Ethyl (B) <10 <10 mg/dL    Comment:        LOWEST DETECTABLE LIMIT FOR SERUM ALCOHOL IS 10 mg/dL FOR MEDICAL PURPOSES ONLY   Protime-INR     Status: None   Collection Time: 05/31/17 12:55 PM  Result Value Ref Range   Prothrombin Time 13.0 11.4 - 15.2 seconds   INR 0.99   APTT     Status: None   Collection Time: 05/31/17 12:55 PM  Result Value Ref Range   aPTT 30 24 - 36 seconds  CBC     Status: Abnormal   Collection Time: 05/31/17 12:55 PM  Result Value Ref Range   WBC 4.5 4.0 - 10.5 K/uL   RBC 3.68 (L) 3.87 - 5.11 MIL/uL   Hemoglobin 11.3 (L) 12.0 - 15.0 g/dL   HCT 33.8 (L) 36.0 - 46.0 %   MCV 91.8 78.0 - 100.0 fL   MCH 30.7 26.0 - 34.0 pg   MCHC 33.4 30.0 - 36.0 g/dL   RDW 13.5 11.5 - 15.5 %   Platelets 179 150 - 400 K/uL  Differential     Status: None   Collection Time: 05/31/17 12:55 PM  Result Value Ref Range   Neutrophils Relative % 50 %   Neutro Abs 2.2 1.7 - 7.7 K/uL   Lymphocytes Relative 39 %   Lymphs Abs 1.8 0.7 - 4.0 K/uL   Monocytes Relative 8 %   Monocytes Absolute 0.4 0.1 - 1.0 K/uL   Eosinophils Relative 2 %   Eosinophils Absolute 0.1 0.0 - 0.7 K/uL   Basophils Relative 1 %   Basophils Absolute 0.0 0.0 - 0.1 K/uL   Comprehensive metabolic panel     Status: Abnormal   Collection Time: 05/31/17 12:55 PM  Result Value Ref Range   Sodium 136 135 - 145 mmol/L   Potassium 4.7 3.5 - 5.1 mmol/L   Chloride 104 101 - 111 mmol/L   CO2 24 22 -  32 mmol/L   Glucose, Bld 168 (H) 65 - 99 mg/dL   BUN 16 6 - 20 mg/dL   Creatinine, Ser 1.04 (H) 0.44 - 1.00 mg/dL   Calcium 9.1 8.9 - 10.3 mg/dL   Total Protein 6.4 (L) 6.5 - 8.1 g/dL   Albumin 3.8 3.5 - 5.0 g/dL   AST 20 15 - 41 U/L   ALT 13 (L) 14 - 54 U/L   Alkaline Phosphatase 66 38 - 126 U/L   Total Bilirubin 0.7 0.3 - 1.2 mg/dL   GFR calc non Af Amer 51 (L) >60 mL/min   GFR calc Af Amer 59 (L) >60 mL/min    Comment: (NOTE) The eGFR has been calculated using the CKD EPI equation. This calculation has not been validated in all clinical situations. eGFR's persistently <60 mL/min signify possible Chronic Kidney Disease.    Anion gap 8 5 - 15  I-stat troponin, ED     Status: None   Collection Time: 05/31/17  1:02 PM  Result Value Ref Range   Troponin i, poc 0.00 0.00 - 0.08 ng/mL   Comment 3            Comment: Due to the release kinetics of cTnI, a negative result within the first hours of the onset of symptoms does not rule out myocardial infarction with certainty. If myocardial infarction is still suspected, repeat the test at appropriate intervals.   I-Stat Chem 8, ED     Status: Abnormal   Collection Time: 05/31/17  1:04 PM  Result Value Ref Range   Sodium 137 135 - 145 mmol/L   Potassium 4.7 3.5 - 5.1 mmol/L   Chloride 104 101 - 111 mmol/L   BUN 18 6 - 20 mg/dL   Creatinine, Ser 0.90 0.44 - 1.00 mg/dL   Glucose, Bld 170 (H) 65 - 99 mg/dL   Calcium, Ion 1.12 (L) 1.15 - 1.40 mmol/L   TCO2 23 22 - 32 mmol/L   Hemoglobin 11.2 (L) 12.0 - 15.0 g/dL   HCT 33.0 (L) 36.0 - 46.0 %  Urine rapid drug screen (hosp performed)     Status: None   Collection Time: 05/31/17  2:03 PM  Result Value Ref Range   Opiates NONE DETECTED NONE DETECTED   Cocaine  NONE DETECTED NONE DETECTED   Benzodiazepines NONE DETECTED NONE DETECTED   Amphetamines NONE DETECTED NONE DETECTED   Tetrahydrocannabinol NONE DETECTED NONE DETECTED   Barbiturates NONE DETECTED NONE DETECTED    Comment:        DRUG SCREEN FOR MEDICAL PURPOSES ONLY.  IF CONFIRMATION IS NEEDED FOR ANY PURPOSE, NOTIFY LAB WITHIN 5 DAYS.        LOWEST DETECTABLE LIMITS FOR URINE DRUG SCREEN Drug Class       Cutoff (ng/mL) Amphetamine      1000 Barbiturate      200 Benzodiazepine   258 Tricyclics       527 Opiates          300 Cocaine          300 THC              50   Urinalysis, Routine w reflex microscopic     Status: None   Collection Time: 05/31/17  2:03 PM  Result Value Ref Range   Color, Urine YELLOW YELLOW   APPearance CLEAR CLEAR   Specific Gravity, Urine 1.009 1.005 - 1.030   pH 6.0 5.0 - 8.0   Glucose, UA NEGATIVE NEGATIVE  mg/dL   Hgb urine dipstick NEGATIVE NEGATIVE   Bilirubin Urine NEGATIVE NEGATIVE   Ketones, ur NEGATIVE NEGATIVE mg/dL   Protein, ur NEGATIVE NEGATIVE mg/dL   Nitrite NEGATIVE NEGATIVE   Leukocytes, UA NEGATIVE NEGATIVE   Ct Angio Head W Or Wo Contrast  Result Date: 05/31/2017 CLINICAL DATA:  76 y/o F; code stroke, patient given tPA. Right-sided weakness. EXAM: CT ANGIOGRAPHY HEAD AND NECK TECHNIQUE: Multidetector CT imaging of the head and neck was performed using the standard protocol during bolus administration of intravenous contrast. Multiplanar CT image reconstructions and MIPs were obtained to evaluate the vascular anatomy. Carotid stenosis measurements (when applicable) are obtained utilizing NASCET criteria, using the distal internal carotid diameter as the denominator. CONTRAST:  65m ISOVUE-370 IOPAMIDOL (ISOVUE-370) INJECTION 76% COMPARISON:  05/31/2017 CT head. FINDINGS: CTA NECK FINDINGS Aortic arch: Standard branching. Imaged portion shows no evidence of aneurysm or dissection. No significant stenosis of the major arch vessel origins.  Mild calcific atherosclerosis. Right carotid system: No evidence of dissection, stenosis (50% or greater) or occlusion. Mild non stenotic calcific atherosclerosis of carotid bifurcation. Left carotid system: No evidence of dissection, stenosis (50% or greater) or occlusion. Minimal non stenotic calcific atherosclerosis of carotid bifurcation. Vertebral arteries: Dense calcified plaque of left vertebral artery origin with probable at least underlying moderate stenosis, suboptimal assessment of luminal stenosis due to dense calcification. Dense calcified plaque of right vertebral artery origin with mild to moderate 50% stenosis. Codominant vertebral arteries. No dissection or aneurysm. Skeleton: Moderate cervical spondylosis with reversal of curvature at the C5 level and discogenic degenerative changes greatest at C5-6. No high-grade bony foraminal or canal stenosis. Other neck: Multiple thyroid nodules measuring up to 17 mm in the mid right lobe (series 8, image 118). Upper chest: Negative. Review of the MIP images confirms the above findings CTA HEAD FINDINGS Anterior circulation: Mild right and moderate left calcific atherosclerosis of the carotid siphons. Moderate left paraclinoid stenosis. No significant right stenosis. No large vessel occlusion, aneurysm, or significant stenosis of bilateral ACA or MCA identified. Posterior circulation: Dense calcified plaque of left V4 segment proximal to PICA origin with severe stenosis. Left V4 segment calcified plaque just downstream to PICA origin with mild to moderate 50% stenosis. Widely patent right V4 segment. Right fetal PCA. No significant stenosis, aneurysm, or occlusion of the basilar artery or bilateral PCA. Venous sinuses: As permitted by contrast timing, patent. Anatomic variants: Fetal right PCA. Small left posterior communicating artery. No anterior communicating artery identified, likely hypoplastic or absent. Other: Calcified dural nodule in the midline  overlying frontal lobes measuring 10 x 11 mm (series 7, image 69) which may represent a meningioma. Review of the MIP images confirms the above findings IMPRESSION: CTA neck: 1. No evidence of dissection, stenosis (50% or greater) or occlusion of the carotid arteries in the neck. 2. Bilateral vertebral artery origin dense calcified plaque with 50% mild-to-moderate right and at least moderate left stenosis. 3. Thyroid nodules measuring to 17 mm in the right lobe, thyroid ultrasound is recommended on a nonemergent basis to further evaluate. CTA head: 1. No large vessel occlusion or aneurysm identified. 2. Left V4 segment tandem calcified plaques with up to severe stenosis. 3. Bilateral carotid siphon calcified plaque with moderate left paraclinoid stenosis. No significant right stenosis. 4. Calcified frontal midline dural nodule measuring 11 mm, possible meningioma. These results were text paged at the time of interpretation on 05/31/2017 at 2:15 pm to Dr. EKerney Elbe. Electronically Signed   By: LMia Creek  Furusawa-Stratton M.D.   On: 05/31/2017 14:16   Ct Angio Neck W Or Wo Contrast  Result Date: 05/31/2017 CLINICAL DATA:  76 y/o F; code stroke, patient given tPA. Right-sided weakness. EXAM: CT ANGIOGRAPHY HEAD AND NECK TECHNIQUE: Multidetector CT imaging of the head and neck was performed using the standard protocol during bolus administration of intravenous contrast. Multiplanar CT image reconstructions and MIPs were obtained to evaluate the vascular anatomy. Carotid stenosis measurements (when applicable) are obtained utilizing NASCET criteria, using the distal internal carotid diameter as the denominator. CONTRAST:  44m ISOVUE-370 IOPAMIDOL (ISOVUE-370) INJECTION 76% COMPARISON:  05/31/2017 CT head. FINDINGS: CTA NECK FINDINGS Aortic arch: Standard branching. Imaged portion shows no evidence of aneurysm or dissection. No significant stenosis of the major arch vessel origins. Mild calcific atherosclerosis.  Right carotid system: No evidence of dissection, stenosis (50% or greater) or occlusion. Mild non stenotic calcific atherosclerosis of carotid bifurcation. Left carotid system: No evidence of dissection, stenosis (50% or greater) or occlusion. Minimal non stenotic calcific atherosclerosis of carotid bifurcation. Vertebral arteries: Dense calcified plaque of left vertebral artery origin with probable at least underlying moderate stenosis, suboptimal assessment of luminal stenosis due to dense calcification. Dense calcified plaque of right vertebral artery origin with mild to moderate 50% stenosis. Codominant vertebral arteries. No dissection or aneurysm. Skeleton: Moderate cervical spondylosis with reversal of curvature at the C5 level and discogenic degenerative changes greatest at C5-6. No high-grade bony foraminal or canal stenosis. Other neck: Multiple thyroid nodules measuring up to 17 mm in the mid right lobe (series 8, image 118). Upper chest: Negative. Review of the MIP images confirms the above findings CTA HEAD FINDINGS Anterior circulation: Mild right and moderate left calcific atherosclerosis of the carotid siphons. Moderate left paraclinoid stenosis. No significant right stenosis. No large vessel occlusion, aneurysm, or significant stenosis of bilateral ACA or MCA identified. Posterior circulation: Dense calcified plaque of left V4 segment proximal to PICA origin with severe stenosis. Left V4 segment calcified plaque just downstream to PICA origin with mild to moderate 50% stenosis. Widely patent right V4 segment. Right fetal PCA. No significant stenosis, aneurysm, or occlusion of the basilar artery or bilateral PCA. Venous sinuses: As permitted by contrast timing, patent. Anatomic variants: Fetal right PCA. Small left posterior communicating artery. No anterior communicating artery identified, likely hypoplastic or absent. Other: Calcified dural nodule in the midline overlying frontal lobes measuring 10  x 11 mm (series 7, image 69) which may represent a meningioma. Review of the MIP images confirms the above findings IMPRESSION: CTA neck: 1. No evidence of dissection, stenosis (50% or greater) or occlusion of the carotid arteries in the neck. 2. Bilateral vertebral artery origin dense calcified plaque with 50% mild-to-moderate right and at least moderate left stenosis. 3. Thyroid nodules measuring to 17 mm in the right lobe, thyroid ultrasound is recommended on a nonemergent basis to further evaluate. CTA head: 1. No large vessel occlusion or aneurysm identified. 2. Left V4 segment tandem calcified plaques with up to severe stenosis. 3. Bilateral carotid siphon calcified plaque with moderate left paraclinoid stenosis. No significant right stenosis. 4. Calcified frontal midline dural nodule measuring 11 mm, possible meningioma. These results were text paged at the time of interpretation on 05/31/2017 at 2:15 pm to Dr. EKerney Elbe. Electronically Signed   By: LKristine GarbeM.D.   On: 05/31/2017 14:16   Ct Head Code Stroke Wo Contrast  Result Date: 05/31/2017 CLINICAL DATA:  Code stroke. 76year old female with right side weakness. Last  seen normal at noon. EXAM: CT HEAD WITHOUT CONTRAST TECHNIQUE: Contiguous axial images were obtained from the base of the skull through the vertex without intravenous contrast. COMPARISON:  Head CT 09/07/2014 and brain MRI 06/07/2012 FINDINGS: Brain: Stable cerebral volume since 2016. No midline shift, ventriculomegaly, mass effect, evidence of mass lesion, intracranial hemorrhage or evidence of cortically based acute infarction. Gray-white matter differentiation is within normal limits throughout the brain. Vascular: Calcified atherosclerosis at the skull base. No suspicious intracranial vascular hyperdensity. Skull: Stable and negative. Sinuses/Orbits: Visualized paranasal sinuses and mastoids are stable and well pneumatized. Other: Stable and negative orbit and  scalp soft tissues. ASPECTS ([place] Stroke Program Early CT Score) - Ganglionic level infarction (caudate, lentiform nuclei, internal capsule, insula, M1-M3 cortex): 7 - Supraganglionic infarction (M4-M6 cortex): 3 Total score (0-10 with 10 being normal): 10 IMPRESSION: 1. Stable and normal for age noncontrast CT appearance of the brain. 2. ASPECTS is 10. 3. The above was relayed via text pager to Dr. Bruce Donath on 05/31/2017 at 13:11 . Electronically Signed   By: Genevie Ann M.D.   On: 05/31/2017 13:11    Assessment: 76 y.o. female with acute onset of dysphasia and right sided weakness 1. Exam reveals right sided sensory deficit, right superior quadrantanopsia and mild RLE weakness. Findings localizable to the left MCA territory, most likely corresponding to occlusion of a small distal branch.  2. CT head negative for acute abnormality.  3. CTA neck reveals no evidence of dissection, severe stenosis or occlusion of the carotid arteries in the neck. Bilateral vertebral artery origin dense calcified plaque with 50% mild-to-moderate right and at least moderate left stenosis is noted. 4. CTA head reveals no large vessel occlusion or aneurysm. Atherosclerotic changes are noted as follows: Left V4 segment tandem calcified plaques with up to severe stenosis. Bilateral carotid siphon calcified plaque with moderate left paraclinoid stenosis.  5. CTA head/neck with additional findings of calcified frontal midline dural nodule measuring 11 mm, possible meningioma and thyroid nodules. .  Stroke Risk Factors - DM2, HLD, HTN, prior MI and prior TIA.  Plan: 1. The patient is a tPA candidate without contraindications. Discussed risks/benefits of IV tPA versus no tPA as well as my medical opinion that IV tPA is indicated with an overall beneficial risk/benefit profile. The patient expressed understanding and gave consent for tPA administration.  2. Following tPA, admit to ICU under Neurology service.  3. No antiplatelet  medications or anticoagulants for at least 24 hours following tPA. Can restart ASA if CT head at 24 hours is negative for hemorrhagic conversion. Consider adding Plavix as she is classifiable as having failed ASA monotherapy.  4. Continue pravastatin. She is allergic to atorvastatin.  5. BP management as per post-tPA order set 6. MRI brain with and without contrast.  7. TTE 8. Carotid ultrasound 9. PT consult, OT consult, Speech consult 10. Telemetry monitoring 11. Thyroid ultrasound as outpatient as per Radiology recommendations.  12. HgbA1c, fasting lipid panel 13. SSI  Electronically signed: Dr. Kerney Elbe 05/31/2017, 6:38 PM

## 2017-05-31 NOTE — ED Triage Notes (Signed)
LSN 1200 today. Pt trying to pick up and object with right hand and could not grasp the object. Pt also noticed she could not say the words she was trying to say. Denies HA. Has had TIA in the past

## 2017-05-31 NOTE — ED Notes (Signed)
Pt's sons at bedside. Pt's sons given pt's shoes, partial dentures, ear rings, watch, shirt, bra. Pt is keeping her cell phone in her possession.

## 2017-05-31 NOTE — ED Notes (Signed)
CTA in progress.

## 2017-05-31 NOTE — ED Notes (Signed)
Followed TPA medication with 50mL NS flush per protocol

## 2017-05-31 NOTE — Code Documentation (Signed)
76yo female arriving to [facility] via [facility] at [phone].  Patient is in from out of town and was with family at 1200 when she had difficulty using her right hand to pick something up and then noticed difficulty getting her words out.  EMS activated a code stroke.  Stroke team at the bedside on patient arrival.  Labs drawn and patient cleared for CT by Dr. Rhunette CroftNanavati.  Patient to CT with team.  CT completed.  VS assessed and PIV x 2 placed.  NIHSS 3, see documentation for details and code stroke times.  Patient with right upper quadrant visual field deficit, right leg drift and left leg decreased sensation (baseline per patient) on exam.  Patient with h/o TIA, diabetes, high cholesterol, MI and HTN per EMS.  Dr. Otelia LimesLindzen at the bedside discussing treatment options with patient who agreed to proceed with tPA.  Pharmacy at the bedside to mix tPA.  Patient hypertensive requiring Labetalol 10mg  IVP followed by Cardene gtt, see documentation and MAR for BPs and titrations.  Patient with BP in tPA administration parameters at 1324.  7mg  tPA bolus given over 1 minute at 1325 followed by 61mg /hr for a total of 68mg  per pharmacy dosing.  CTA head and neck completed following tPA administration.  Patient back to the ED.  Family to the bedside and updated on plan of care.  Patient monitored frequently per post-tPA protocol.  Cardene gtt decreased and subsequently stopped.  Patient to be admitted to ICU once bed available.  Bedside handoff with ED RN Hope.

## 2017-06-01 ENCOUNTER — Inpatient Hospital Stay (HOSPITAL_COMMUNITY): Payer: Medicare Other

## 2017-06-01 DIAGNOSIS — I10 Essential (primary) hypertension: Secondary | ICD-10-CM

## 2017-06-01 DIAGNOSIS — E1159 Type 2 diabetes mellitus with other circulatory complications: Secondary | ICD-10-CM

## 2017-06-01 DIAGNOSIS — I63312 Cerebral infarction due to thrombosis of left middle cerebral artery: Secondary | ICD-10-CM

## 2017-06-01 DIAGNOSIS — E785 Hyperlipidemia, unspecified: Secondary | ICD-10-CM

## 2017-06-01 LAB — ECHOCARDIOGRAM COMPLETE
Ao-asc: 38 cm
E decel time: 345 msec
E/e' ratio: 9.59
FS: 24 % — AB (ref 28–44)
Height: 69 in
IVS/LV PW RATIO, ED: 1.29
LA ID, A-P, ES: 40 mm
LA diam end sys: 40 mm
LA diam index: 2.08 cm/m2
LA vol A4C: 52.3 ml
LA vol index: 32.4 mL/m2
LA vol: 62.2 mL
LV E/e' medial: 9.59
LV E/e'average: 9.59
LV PW d: 11 mm — AB (ref 0.6–1.1)
LV e' LATERAL: 6.83 cm/s
LVOT area: 2.84 cm2
LVOT diameter: 19 mm
Lateral S' vel: 11 cm/s
MV Dec: 345
MV pk A vel: 93.6 m/s
MV pk E vel: 65.5 m/s
P 1/2 time: 620 ms
Reg peak vel: 182 cm/s
TAPSE: 17.7 mm
TDI e' lateral: 6.83
TDI e' medial: 4.29
TR max vel: 182 cm/s
Weight: 2649.05 oz

## 2017-06-01 LAB — LIPID PANEL
Cholesterol: 162 mg/dL (ref 0–200)
HDL: 54 mg/dL (ref >40)
LDL Cholesterol: 84 mg/dL (ref 0–99)
Total CHOL/HDL Ratio: 3 RATIO
Triglycerides: 122 mg/dL (ref <150)
VLDL: 24 mg/dL (ref 0–40)

## 2017-06-01 LAB — GLUCOSE, CAPILLARY
Glucose-Capillary: 155 mg/dL — ABNORMAL HIGH (ref 65–99)
Glucose-Capillary: 104 mg/dL — ABNORMAL HIGH (ref 65–99)
Glucose-Capillary: 217 mg/dL — ABNORMAL HIGH (ref 65–99)
Glucose-Capillary: 229 mg/dL — ABNORMAL HIGH (ref 65–99)
Glucose-Capillary: 188 mg/dL — ABNORMAL HIGH (ref 65–99)
Glucose-Capillary: 122 mg/dL — ABNORMAL HIGH (ref 65–99)

## 2017-06-01 LAB — HEMOGLOBIN A1C
Hgb A1c MFr Bld: 7.6 % — ABNORMAL HIGH (ref 4.8–5.6)
Mean Plasma Glucose: 171.42 mg/dL

## 2017-06-01 MED ORDER — CARVEDILOL 12.5 MG PO TABS
12.5000 mg | ORAL_TABLET | Freq: Two times a day (BID) | ORAL | Status: DC
  Administered 2017-06-01: 12.5 mg via ORAL
  Filled 2017-06-01: qty 1

## 2017-06-01 MED ORDER — PRAVASTATIN SODIUM 40 MG PO TABS
80.0000 mg | ORAL_TABLET | Freq: Every day | ORAL | Status: DC
  Administered 2017-06-01: 80 mg via ORAL
  Filled 2017-06-01 (×2): qty 2

## 2017-06-01 MED ORDER — PANTOPRAZOLE SODIUM 40 MG PO TBEC
40.0000 mg | DELAYED_RELEASE_TABLET | Freq: Every day | ORAL | Status: DC
  Administered 2017-06-01: 40 mg via ORAL
  Filled 2017-06-01: qty 1

## 2017-06-01 MED ORDER — HYDRALAZINE HCL 20 MG/ML IJ SOLN
5.0000 mg | INTRAMUSCULAR | Status: DC | PRN
  Administered 2017-06-01: 10 mg via INTRAVENOUS
  Filled 2017-06-01: qty 1

## 2017-06-01 MED ORDER — LOSARTAN POTASSIUM 50 MG PO TABS
25.0000 mg | ORAL_TABLET | Freq: Every day | ORAL | Status: DC
  Administered 2017-06-01 – 2017-06-02 (×2): 25 mg via ORAL
  Filled 2017-06-01 (×2): qty 1

## 2017-06-01 MED ORDER — CARVEDILOL 12.5 MG PO TABS
25.0000 mg | ORAL_TABLET | Freq: Two times a day (BID) | ORAL | Status: DC
  Administered 2017-06-01: 25 mg via ORAL
  Filled 2017-06-01: qty 2

## 2017-06-01 MED ORDER — CLOPIDOGREL BISULFATE 75 MG PO TABS
75.0000 mg | ORAL_TABLET | Freq: Every day | ORAL | Status: DC
  Administered 2017-06-02: 75 mg via ORAL
  Filled 2017-06-01 (×2): qty 1

## 2017-06-01 NOTE — Progress Notes (Signed)
PT Cancellation Note  Patient Details Name: Haley PurlBrenda M Dentler MRN: [id number] DOB: 19-Jan-1941   Cancelled Treatment:    Reason Eval/Treat Not Completed: Medical issues which prohibited therapy(strict bedrest orders and await increased activity)   Shamond Skelton B Elim Peale 06/01/2017, 7:07 AM Delaney MeigsMaija Tabor Darnesha Diloreto, PT [phone]

## 2017-06-01 NOTE — Progress Notes (Signed)
  Echocardiogram 2D Echocardiogram has been performed.  [name] [name] 06/01/2017, 12:50 PM

## 2017-06-01 NOTE — Progress Notes (Signed)
Pt transferred from 4N ICU 24 hrs post TPA alert and oriented x 4 MAE no neuro deficit. Oriented to staff ,room and the use of call bell to call for assist at all time . Cardiac monitor  In use   [address] 24 CCMT notified and verification completed. Pt denied any c/o at present. RN will continue to monitor.

## 2017-06-01 NOTE — Progress Notes (Signed)
OT Cancellation Note  Patient Details Name: Haley PurlBrenda M [name] MRN: [id number] DOB: 04-Apr-1941   Cancelled Treatment:    Reason Eval/Treat Not Completed: Patient not medically ready. Pt on bedrest. Please update activity orders when appropriate.  [name]  [name], OT/L  [phone] 06/01/2017 06/01/2017, 7:13 AM

## 2017-06-01 NOTE — Progress Notes (Signed)
STROKE TEAM PROGRESS NOTE   SUBJECTIVE (INTERVAL HISTORY) No family is at the bedside.  Overall she feels her condition is completely resolved. She stated that she had episode of right hand weakness lasted one hour and found to have mild aphasia now also resolved. S/p tPA. She said she had stroke 3 years ago. BP at home was good. However, DM not in perfect control.    OBJECTIVE Temp:  [97.7 F (36.5 C)-98.1 F (36.7 C)] 98.1 F (36.7 C) (11/20 1200) Pulse Rate:  [53-80] 56 (11/20 1300) Cardiac Rhythm: Normal sinus rhythm;Sinus bradycardia (11/20 0800) Resp:  [11-28] 16 (11/20 1300) BP: (110-207)/(44-142) 171/142 (11/20 1300) SpO2:  [92 %-100 %] 99 % (11/20 1300) Weight:  [165 lb 9.1 oz (75.1 kg)-167 lb 5.3 oz (75.9 kg)] 165 lb 9.1 oz (75.1 kg) (11/19 2100)  Recent Labs  Lab 05/31/17 2137 06/01/17 0825 06/01/17 1152  GLUCAP 109* 155* 104*   Recent Labs  Lab 05/31/17 1255 05/31/17 1304  NA 136 137  K 4.7 4.7  CL 104 104  CO2 24  --   GLUCOSE 168* 170*  BUN 16 18  CREATININE 1.04* 0.90  CALCIUM 9.1  --    Recent Labs  Lab 05/31/17 1255  AST 20  ALT 13*  ALKPHOS 66  BILITOT 0.7  PROT 6.4*  ALBUMIN 3.8   Recent Labs  Lab 05/31/17 1255 05/31/17 1304  WBC 4.5  --   NEUTROABS 2.2  --   HGB 11.3* 11.2*  HCT 33.8* 33.0*  MCV 91.8  --   PLT 179  --    No results for input(s): CKTOTAL, CKMB, CKMBINDEX, TROPONINI in the last 168 hours. Recent Labs    05/31/17 1255  LABPROT 13.0  INR 0.99   Recent Labs    05/31/17 1403  COLORURINE YELLOW  LABSPEC 1.009  PHURINE 6.0  GLUCOSEU NEGATIVE  HGBUR NEGATIVE  BILIRUBINUR NEGATIVE  KETONESUR NEGATIVE  PROTEINUR NEGATIVE  NITRITE NEGATIVE  LEUKOCYTESUR NEGATIVE       Component Value Date/Time   CHOL 162 06/01/2017 0333   TRIG 122 06/01/2017 0333   HDL 54 06/01/2017 0333   CHOLHDL 3.0 06/01/2017 0333   VLDL 24 06/01/2017 0333   LDLCALC 84 06/01/2017 0333   Lab Results  Component Value Date   HGBA1C 7.6  (H) 06/01/2017      Component Value Date/Time   LABOPIA NONE DETECTED 05/31/2017 1403   COCAINSCRNUR NONE DETECTED 05/31/2017 1403   LABBENZ NONE DETECTED 05/31/2017 1403   AMPHETMU NONE DETECTED 05/31/2017 1403   THCU NONE DETECTED 05/31/2017 1403   LABBARB NONE DETECTED 05/31/2017 1403    Recent Labs  Lab 05/31/17 1255  ETH <10    I have personally reviewed the radiological images below and agree with the radiology interpretations.  Ct Angio Head and neck W Or Wo Contrast 05/31/2017 IMPRESSION: CTA neck: 1. No evidence of dissection, stenosis (50% or greater) or occlusion of the carotid arteries in the neck. 2. Bilateral vertebral artery origin dense calcified plaque with 50% mild-to-moderate right and at least moderate left stenosis. 3. Thyroid nodules measuring to 17 mm in the right lobe, thyroid ultrasound is recommended on a nonemergent basis to further evaluate. CTA head: 1. No large vessel occlusion or aneurysm identified. 2. Left V4 segment tandem calcified plaques with up to severe stenosis. 3. Bilateral carotid siphon calcified plaque with moderate left paraclinoid stenosis. No significant right stenosis. 4. Calcified frontal midline dural nodule measuring 11 mm, possible meningioma.  Ct Head Wo Contrast 05/31/2017 IMPRESSION: Unchanged examination without acute intracranial abnormality.   Ct Head Code Stroke Wo Contrast 05/31/2017 IMPRESSION: 1. Stable and normal for age noncontrast CT appearance of the brain. 2. ASPECTS is 10. 3.   2D Echocardiogram  Pending  MRI pending   PHYSICAL EXAM  Temp:  [97.7 F (36.5 C)-98.1 F (36.7 C)] 98.1 F (36.7 C) (11/20 1200) Pulse Rate:  [53-80] 56 (11/20 1300) Resp:  [11-28] 16 (11/20 1300) BP: (110-207)/(44-142) 171/142 (11/20 1300) SpO2:  [92 %-100 %] 99 % (11/20 1300) Weight:  [165 lb 9.1 oz (75.1 kg)-167 lb 5.3 oz (75.9 kg)] 165 lb 9.1 oz (75.1 kg) (11/19 2100)  General - Well nourished, well developed, in no  apparent distress.  Ophthalmologic - fundi not visualized due to noncooperation.  Cardiovascular - Regular rate and rhythm with no murmur.  Mental Status -  Level of arousal and orientation to time, place, and person were intact. Language including expression, naming, repetition, comprehension was assessed and found intact. Attention span and concentration were normal. Fund of Knowledge was assessed and was intact.  Cranial Nerves II - XII - II - Visual field intact OU. III, IV, VI - Extraocular movements intact. V - Facial sensation intact bilaterally. VII - Facial movement intact bilaterally. VIII - Hearing & vestibular intact bilaterally. X - Palate elevates symmetrically. XI - Chin turning & shoulder shrug intact bilaterally. XII - Tongue protrusion intact.  Motor Strength - The patient's strength was normal in all extremities and pronator drift was absent.  Bulk was normal and fasciculations were absent.   Motor Tone - Muscle tone was assessed at the neck and appendages and was normal.  Reflexes - The patient's reflexes were symmetrical in all extremities and she had no pathological reflexes.  Sensory - Light touch, temperature/pinprick were assessed and were symmetrical.    Coordination - The patient had normal movements in the hands and feet with no ataxia or dysmetria.  Tremor was absent.  Gait and Station - deferred.   ASSESSMENT/PLAN [name] is a 76 y.o. female with history of DM, HTN, HLD, MI s/p stent, TIA admitted for right hand transient weakness and slurry speech vs. Aphasia. TPA given.    Left brain stroke vs. TIA  Resultant back to baseline  MRI  pending  CTA head and neck - atherosclerosis of left V4, b/l VA origin, b/l siphon  2D Echo pending  LDL 84  HgbA1c 7.6   for VTE prophylaxis  Diet Carb Modified Fluid consistency: Thin; Room service appropriate? Yes   aspirin 81 mg daily prior to admission, now on No antithrombotic within  24h of tPA  Patient counseled to be compliant with her antithrombotic medications  Ongoing aggressive stroke risk factor management  Therapy recommendations:  pending  Disposition:  pending  Diabetes  HgbA1c 7.6 goal < 7.0  Uncontrolled  Currently on insulin  CBG monitoring  SSI  DM education and close PCP follow up  Hypertension Stable, on the high side Permissive hypertension (OK if <180/105) for 24-48 hours post stroke and then gradually normalized within 5-7 days.  Long term BP goal normotensive  Hyperlipidemia  Home meds:  pravastatin 40  LDL 84, goal < 70  Now on pravastatin 80  Continue statin at discharge  Other Stroke Risk Factors  Advanced age  Hx stroke/TIA  Coronary artery disease s/p stent  Other Active Problems  Lives in [facility] day # 1  This patient  is critically ill due to stroke like symptoms s/p tPA and at significant risk of neurological worsening, death form recurrent stroke, hemorrhagic conversion, DKA, heart failure. This patient's care requires constant monitoring of vital signs, hemodynamics, respiratory and cardiac monitoring, review of multiple databases, neurological assessment, discussion with family, other specialists and medical decision making of high complexity. I spent 35 minutes of neurocritical care time in the care of this patient.   Marvel PlanJindong Dlisa Barnwell, MD PhD Stroke Neurology 06/01/2017 1:11 PM    To contact Stroke Continuity provider, please refer to [email or web address]. After hours, contact General Neurology

## 2017-06-02 ENCOUNTER — Other Ambulatory Visit: Payer: Self-pay

## 2017-06-02 ENCOUNTER — Inpatient Hospital Stay (HOSPITAL_COMMUNITY): Payer: Medicare Other

## 2017-06-02 DIAGNOSIS — G459 Transient cerebral ischemic attack, unspecified: Principal | ICD-10-CM

## 2017-06-02 LAB — GLUCOSE, CAPILLARY
Glucose-Capillary: 165 mg/dL — ABNORMAL HIGH (ref 65–99)
Glucose-Capillary: 184 mg/dL — ABNORMAL HIGH (ref 65–99)
Glucose-Capillary: 241 mg/dL — ABNORMAL HIGH (ref 65–99)

## 2017-06-02 LAB — BASIC METABOLIC PANEL
Anion gap: 6 (ref 5–15)
BUN: 16 mg/dL (ref 6–20)
CO2: 23 mmol/L (ref 22–32)
Calcium: 9.1 mg/dL (ref 8.9–10.3)
Chloride: 108 mmol/L (ref 101–111)
Creatinine, Ser: 0.86 mg/dL (ref 0.44–1.00)
GFR calc Af Amer: >60 mL/min (ref >60)
GFR calc non Af Amer: >60 mL/min (ref >60)
Glucose, Bld: 193 mg/dL — ABNORMAL HIGH (ref 65–99)
Potassium: 4.3 mmol/L (ref 3.5–5.1)
Sodium: 137 mmol/L (ref 135–145)

## 2017-06-02 LAB — CBC
HCT: 34.5 % — ABNORMAL LOW (ref 36.0–46.0)
Hemoglobin: 11.3 g/dL — ABNORMAL LOW (ref 12.0–15.0)
MCH: 30.3 pg (ref 26.0–34.0)
MCHC: 32.8 g/dL (ref 30.0–36.0)
MCV: 92.5 fL (ref 78.0–100.0)
Platelets: 174 10*3/uL (ref 150–400)
RBC: 3.73 MIL/uL — ABNORMAL LOW (ref 3.87–5.11)
RDW: 14 % (ref 11.5–15.5)
WBC: 4.2 10*3/uL (ref 4.0–10.5)

## 2017-06-02 MED ORDER — PRAVASTATIN SODIUM 80 MG PO TABS: 80.0000 mg | ORAL_TABLET | Freq: Every day | ORAL | 0 refills | Status: DC

## 2017-06-02 MED ORDER — CLOPIDOGREL BISULFATE 75 MG PO TABS: 75.0000 mg | ORAL_TABLET | Freq: Every day | ORAL | 3 refills | Status: DC

## 2017-06-02 MED ORDER — PANTOPRAZOLE SODIUM 40 MG PO TBEC: 40.0000 mg | DELAYED_RELEASE_TABLET | Freq: Every day | ORAL | 3 refills | Status: AC

## 2017-06-02 NOTE — Progress Notes (Signed)
Patient discharged home. Discharge information and prescriptions given. Patient questions asked and answered.  IV and telemetry discontinued. Patient awaiting her son, who will transport patient home this evening. Lawson RadarHeather M Juanito Gonyer

## 2017-06-02 NOTE — Evaluation (Signed)
Occupational Therapy Evaluation and Discharge  Patient Details Name: Haley PurlBrenda M Simeone MRN: [id number] DOB: 1940/10/07 Today's Date: 06/02/2017    History of Present Illness 76 y.o.femalewho presented to the ED with acute onset of right hand weakness and slurred speech. PMHx: DM2, Hiatal hernia, Hyperlipidemia, HTN, MI, TIA, Back sx 2012. CT on 11/20 negative for acute findings.   Clinical Impression   Pt reports she was independent with ADL PTA. Currently pt overall supervision for ADL and functional mobility. Pt reports R hand numbness, speech, and visual deficits have resolved at this time. Pt planning to d/c home with family supervision initially. No further acute OT needs identified; signing off at this time. Please re-consult if needs change. Thank you for this referral.    Follow Up Recommendations  No OT follow up;Supervision - Intermittent    Equipment Recommendations  None recommended by OT    Recommendations for Other Services       Precautions / Restrictions Precautions Precautions: None Restrictions Weight Bearing Restrictions: No      Mobility Bed Mobility Overal bed mobility: Modified Independent                Transfers Overall transfer level: Needs assistance Equipment used: None Transfers: Sit to/from Stand Sit to Stand: Supervision         General transfer comment: Supervision for safety, increased time due to chronic back pain    Balance Overall balance assessment: Needs assistance Sitting-balance support: Feet supported;No upper extremity supported Sitting balance-Leahy Scale: Normal     Standing balance support: No upper extremity supported;During functional activity Standing balance-Leahy Scale: Good                             ADL either performed or assessed with clinical judgement   ADL Overall ADL's : Needs assistance/impaired Eating/Feeding: Set up;Sitting   Grooming: Supervision/safety;Standing   Upper Body  Bathing: Set up;Sitting   Lower Body Bathing: Supervison/ safety;Sit to/from stand   Upper Body Dressing : Set up;Sitting   Lower Body Dressing: Supervision/safety;Sit to/from stand Lower Body Dressing Details (indicate cue type and reason): able to don socks sitting EOB Toilet Transfer: Supervision/safety;Ambulation;Regular Teacher, adult educationToilet Toilet Transfer Details (indicate cue type and reason): simulated by sit to stand from EOB with functional mobility in room         Functional mobility during ADLs: Supervision/safety General ADL Comments: Pt reports she feels she has returned to her functional basline at this time. Discussed signs/symptoms of CVA     Vision Baseline Vision/History: Wears glasses Wears Glasses: Reading only Patient Visual Report: No change from baseline Vision Assessment?: No apparent visual deficits     Perception     Praxis      Pertinent Vitals/Pain Pain Assessment: No/denies pain     Hand Dominance Right   Extremity/Trunk Assessment Upper Extremity Assessment Upper Extremity Assessment: Overall WFL for tasks assessed   Lower Extremity Assessment Lower Extremity Assessment: Defer to PT evaluation   Cervical / Trunk Assessment Cervical / Trunk Assessment: Other exceptions Cervical / Trunk Exceptions: s/p spinal sx   Communication Communication Communication: No difficulties   Cognition Arousal/Alertness: Awake/alert Behavior During Therapy: WFL for tasks assessed/performed Overall Cognitive Status: Within Functional Limits for tasks assessed                                 General Comments: Pt reports she has  noticed recent short term memory changes in the past few months   General Comments       Exercises     Shoulder Instructions      Home Living Family/patient expects to be discharged to:: Private residence Living Arrangements: Alone Available Help at Discharge: Family;Friend(s);Available PRN/intermittently Type of Home:  Other(Comment)(motor home) Home Access: Stairs to enter     Home Layout: Two level Alternate Level Stairs-Number of Steps: 2 steps up to bedroom Alternate Level Stairs-Rails: Right;Left;Can reach both Bathroom Shower/Tub: Producer, television/film/videoWalk-in shower   Bathroom Toilet: Handicapped height     Home Equipment: None   Additional Comments: [place] in [place]; staying with family in [place] for the next few days-was in town for the holidays      Prior Functioning/Environment Level of Independence: Independent        Comments: drives, works        OT Problem List:        OT Treatment/Interventions:      OT Goals(Current goals can be found in the care plan section) Acute Rehab OT Goals Patient Stated Goal: return home OT Goal Formulation: All assessment and education complete, DC therapy  OT Frequency:     Barriers to D/C:            Co-evaluation              AM-PAC PT "6 Clicks" Daily Activity     Outcome Measure Help from another person eating meals?: None Help from another person taking care of personal grooming?: A Little Help from another person toileting, which includes using toliet, bedpan, or urinal?: A Little Help from another person bathing (including washing, rinsing, drying)?: A Little Help from another person to put on and taking off regular upper body clothing?: None Help from another person to put on and taking off regular lower body clothing?: A Little 6 Click Score: 20   End of Session    Activity Tolerance: Patient tolerated treatment well Patient left: in bed;with call bell/phone within reach;with nursing/sitter in room(sitting EOB)  OT Visit Diagnosis: Unsteadiness on feet (R26.81)                Time: [id number] OT Time Calculation (min): 12 min Charges:  OT General Charges $OT Visit: 1 Visit OT Evaluation $OT Eval Low Complexity: 1 Low G-Codes:     Murice Barbar A. Brett Albinooffey, M.S., OTR/L Pager: [phone]  Gaye AlkenBailey A Tirza Senteno 06/02/2017, 11:57 AM

## 2017-06-02 NOTE — Discharge Summary (Signed)
Stroke Discharge Summary  Patient ID: [name]   MRN: [id number]      DOB: 07/20/1940  Date of Admission: 05/31/2017 Date of Discharge: 06/02/2017  Attending Physician:  No att. providers found, Stroke MD Consultant(s):    None  Patient's PCP:  Pearson Grippe, MD  DISCHARGE DIAGNOSIS: TIA Active Problems:   DM   HTN   HLD   Hx of TIA   CAD s/p stent   Past Medical History:  Diagnosis Date  . Chronic back pain    S/P MRI 2011L5-S1 large left paracentral disc herniation  . DM2 (diabetes mellitus, type 2) (HCC)   . Headache(784.0)    Carotid US/ Negative/[facility]  . Hiatal hernia   . Hyperlipidemia   . Hypertension   . Insomnia   . MI (myocardial infarction) (HCC) 2005   P stent x3 in 2008  . TIA (transient ischemic attack) 2013   08-25-2011, 09-06-2011  . Vascular migraine    Past Surgical History:  Procedure Laterality Date  . ANTERIOR AND POSTERIOR VAGINAL REPAIR    . BACK SURGERY  07/31/2010   10/30/2010  . CYSTOCELE REPAIR  12/03/2008  . Gastric bypass surgery  1984  . SQUAMOUS CELL CARCINOMA EXCISION     Removed from temple and lt eyebrow.  Marland Kitchen TOTAL ABDOMINAL HYSTERECTOMY    . Tummy tuck  1984  . Unilateral salpingoophorectomy  1983    Allergies as of 06/02/2017      Reactions   Byetta 5 Mcg Pen [exenatide] Nausea Only   Codeine Sulfate Other (See Comments)   Metoprolol Tartrate Cough   Lipitor [atorvastatin] Palpitations   Felt like heart attack      Medication List    STOP taking these medications   aspirin 81 MG tablet   MOVIPREP 100 g Solr Generic drug:  peg 3350 powder     TAKE these medications   carvedilol 25 MG tablet Commonly known as:  COREG Take 25 mg by mouth 2 (two) times daily with a meal.   clopidogrel 75 MG tablet Commonly known as:  PLAVIX Take 1 tablet (75 mg total) by mouth daily. Start taking on:  06/03/2017   glimepiride 4 MG tablet Commonly known as:  AMARYL Take 4 mg 2 (two) times daily by  mouth.   ibuprofen 600 MG tablet Commonly known as:  ADVIL,MOTRIN Take 600 mg by mouth every 6 (six) hours as needed for pain. Take 1 tab 3 times daily.   losartan 25 MG tablet Commonly known as:  COZAAR Take 25 mg daily by mouth.   metFORMIN 500 MG tablet Commonly known as:  GLUCOPHAGE Take 500 mg 2 (two) times daily with a meal by mouth.   nitroGLYCERIN 0.4 MG SL tablet Commonly known as:  NITROSTAT Place 0.4 mg under the tongue every 5 (five) minutes as needed for chest pain. Take 1 tab sublingual under the tongue and allow to dissolve as needed.   pantoprazole 40 MG tablet Commonly known as:  PROTONIX Take 1 tablet (40 mg total) by mouth daily. Take 1 tab twice a day. What changed:  when to take this   pravastatin 80 MG tablet Commonly known as:  PRAVACHOL Take 1 tablet (80 mg total) by mouth daily at 6 PM. What changed:    medication strength  how much to take  when to take this       LABORATORY STUDIES CBC    Component Value Date/Time   WBC 4.2  06/02/2017 0518   RBC 3.73 (L) 06/02/2017 0518   HGB 11.3 (L) 06/02/2017 0518   HCT 34.5 (L) 06/02/2017 0518   PLT 174 06/02/2017 0518   MCV 92.5 06/02/2017 0518   MCH 30.3 06/02/2017 0518   MCHC 32.8 06/02/2017 0518   RDW 14.0 06/02/2017 0518   LYMPHSABS 1.8 05/31/2017 1255   MONOABS 0.4 05/31/2017 1255   EOSABS 0.1 05/31/2017 1255   BASOSABS 0.0 05/31/2017 1255   CMP    Component Value Date/Time   NA 137 06/02/2017 0518   K 4.3 06/02/2017 0518   CL 108 06/02/2017 0518   CO2 23 06/02/2017 0518   GLUCOSE 193 (H) 06/02/2017 0518   BUN 16 06/02/2017 0518   CREATININE 0.86 06/02/2017 0518   CALCIUM 9.1 06/02/2017 0518   PROT 6.4 (L) 05/31/2017 1255   ALBUMIN 3.8 05/31/2017 1255   AST 20 05/31/2017 1255   ALT 13 (L) 05/31/2017 1255   ALKPHOS 66 05/31/2017 1255   BILITOT 0.7 05/31/2017 1255   GFRNONAA >60 06/02/2017 0518   GFRAA >60 06/02/2017 0518   COAGS Lab Results  Component Value Date   INR  0.99 05/31/2017   Lipid Panel    Component Value Date/Time   CHOL 162 06/01/2017 0333   TRIG 122 06/01/2017 0333   HDL 54 06/01/2017 0333   CHOLHDL 3.0 06/01/2017 0333   VLDL 24 06/01/2017 0333   LDLCALC 84 06/01/2017 0333   HgbA1C  Lab Results  Component Value Date   HGBA1C 7.6 (H) 06/01/2017   Urinalysis    Component Value Date/Time   COLORURINE YELLOW 05/31/2017 1403   APPEARANCEUR CLEAR 05/31/2017 1403   LABSPEC 1.009 05/31/2017 1403   PHURINE 6.0 05/31/2017 1403   GLUCOSEU NEGATIVE 05/31/2017 1403   HGBUR NEGATIVE 05/31/2017 1403   BILIRUBINUR NEGATIVE 05/31/2017 1403   KETONESUR NEGATIVE 05/31/2017 1403   PROTEINUR NEGATIVE 05/31/2017 1403   NITRITE NEGATIVE 05/31/2017 1403   LEUKOCYTESUR NEGATIVE 05/31/2017 1403   Urine Drug Screen     Component Value Date/Time   LABOPIA NONE DETECTED 05/31/2017 1403   COCAINSCRNUR NONE DETECTED 05/31/2017 1403   LABBENZ NONE DETECTED 05/31/2017 1403   AMPHETMU NONE DETECTED 05/31/2017 1403   THCU NONE DETECTED 05/31/2017 1403   LABBARB NONE DETECTED 05/31/2017 1403    Alcohol Level    Component Value Date/Time   ETH <10 05/31/2017 1255     SIGNIFICANT DIAGNOSTIC STUDIES I have personally reviewed the radiological images below and agree with the radiology interpretations.  Ct Angio Head and neck W Or Wo Contrast 05/31/2017 IMPRESSION: CTA neck: 1. No evidence of dissection, stenosis (50% or greater) or occlusion of the carotid arteries in the neck. 2. Bilateral vertebral artery origin dense calcified plaque with 50% mild-to-moderate right and at least moderate left stenosis. 3. Thyroid nodules measuring to 17 mm in the right lobe, thyroid ultrasound is recommended on a nonemergent basis to further evaluate. CTA head: 1. No large vessel occlusion or aneurysm identified. 2. Left V4 segment tandem calcified plaques with up to severe stenosis. 3. Bilateral carotid siphon calcified plaque with moderate left paraclinoid  stenosis. No significant right stenosis. 4. Calcified frontal midline dural nodule measuring 11 mm, possible meningioma.   Ct Head Wo Contrast 05/31/2017 IMPRESSION: Unchanged examination without acute intracranial abnormality.   Ct Head Code Stroke Wo Contrast 05/31/2017 IMPRESSION: 1. Stable and normal for age noncontrast CT appearance of the brain. 2. ASPECTS is 10. 3.   2D Echocardiogram  - Left ventricle: The  cavity size was normal. There was mild   concentric hypertrophy. Systolic function was normal. The   estimated ejection fraction was in the range of 55% to 60%. Wall   motion was normal; there were no regional wall motion   abnormalities. Doppler parameters are consistent with abnormal   left ventricular relaxation (grade 1 diastolic dysfunction). - Aortic valve: There was trivial regurgitation. - Mitral valve: Calcified annulus. - Left atrium: The atrium was mildly dilated.  Mr Brain Wo Contrast  06/02/2017 IMPRESSION: 1. No acute intracranial abnormality. 2. Mild chronic microvascular ischemic disease. 3. Approximate 1 cm right parafalcine meningioma without significant mass effect, relatively similar to 2013. 4. 12 mm cyst within the right parotid gland, also similar to 2013.    HISTORY OF PRESENT ILLNESS [name] is an 76 y.o. female who presented to the ED with acute onset of right hand weakness and slurred speech. [place] in [place]. Endorsed not being able to grasp an object with her right hand, in addition to not being able to say the words she was trying to say. Symptoms began at noon, which is also her last known normal. She is not on a blood thinner and has no history of ICH or other intracranial lesion. She takes ASA 81 mg daily, as well as pravastatin 40 mg qd. She is allergic to Lipitor.   Her stroke risk factors include DM2, HLD, HTN, prior MI and prior TIA.   LSN: Noon tPA Given: Yes NIHSS: 3    HOSPITAL COURSE Ms. Haley PurlBrenda M [name] is a 76 y.o. female with history of DM, HTN, HLD, MI s/p stent, TIA admitted for right hand transient weakness and slurry speech vs. Aphasia. TPA given.    Suspected stroke s/p tPA but MRI neg  Resultant back to baseline  MRI no acute stroke  CTA head and neck - non-stenotic atherosclerosis of left V4, b/l VA origin, b/l siphon  2D Echo unremarkable  LDL 84  HgbA1c 7.6   for VTE prophylaxis  Diet Carb Modified Fluid consistency: Thin; Room service appropriate? Yes   aspirin 81 mg daily prior to admission, now on plavix. Discharge with plavix  Patient counseled to be compliant with her antithrombotic medications  Ongoing aggressive stroke risk factor management  Therapy recommendations:  pending  Disposition:  pending  Diabetes  HgbA1c 7.6 goal < 7.0  Uncontrolled  Currently on insulin  CBG monitoring  SSI  DM education and close PCP follow up  Hypertension  Stable, on the high side  Permissive hypertension (OK if <180/105) for 24-48 hours post stroke and then gradually normalized within 5-7 days.  Long term BP goal normotensive  Hyperlipidemia  Home meds:  pravastatin 40  LDL 84, goal < 70  Now on pravastatin 80  Continue statin at discharge  Other Stroke Risk Factors  Advanced age  Hx stroke/TIA  Coronary artery disease s/p stent  Other Active Problems  [place]    DISCHARGE EXAM Blood pressure (!) 138/58, pulse (!) 57, temperature 98.4 F (36.9 C), temperature source Oral, resp. rate 16, height 5\' 9"  (1.753 m), weight 165 lb 9.1 oz (75.1 kg), SpO2 100 %.   General - Well nourished, well developed, in no apparent distress.  Ophthalmologic - fundi not visualized due to noncooperation.  Cardiovascular - Regular rate and rhythm with no murmur.  Mental Status -  Level of arousal and orientation to time, place, and person were intact. Language  including expression,  naming, repetition, comprehension was assessed and found intact. Attention span and concentration were normal. Fund of Knowledge was assessed and was intact.  Cranial Nerves II - XII - II - Visual field intact OU. III, IV, VI - Extraocular movements intact. V - Facial sensation intact bilaterally. VII - Facial movement intact bilaterally. VIII - Hearing & vestibular intact bilaterally. X - Palate elevates symmetrically. XI - Chin turning & shoulder shrug intact bilaterally. XII - Tongue protrusion intact.  Motor Strength - The patient's strength was normal in all extremities and pronator drift was absent.  Bulk was normal and fasciculations were absent.   Motor Tone - Muscle tone was assessed at the neck and appendages and was normal.  Reflexes - The patient's reflexes were symmetrical in all extremities and she had no pathological reflexes.  Sensory - Light touch, temperature/pinprick were assessed and were symmetrical.    Coordination - The patient had normal movements in the hands and feet with no ataxia or dysmetria.  Tremor was absent.  Gait and Station - deferred.  Discharge Diet   Thin liquids, heart healthy diet  DISCHARGE PLAN  Disposition:  home  clopidogrel 75 mg daily for secondary stroke prevention.  Ongoing risk factor control by Primary Care Physician at time of discharge  Follow-up Pearson GrippeKim, James, MD in 2 weeks.  [place]  35 minutes were spent preparing discharge.  Marvel PlanJindong Shterna Laramee, MD PhD Stroke Neurology 06/02/2017 11:04 PM

## 2017-06-02 NOTE — Care Management Note (Signed)
Case Management Note  Patient Details  Name: Haley PurlBrenda M Saber MRN: [id number] Date of Birth: Aug 24, 1940  Subjective/Objective:                    Action/Plan: Pt discharging home with self care. Pt has PCP, insurance and transportation home. No further needs per CM.  Expected Discharge Date:  06/02/17               Expected Discharge Plan:  Home/Self Care  In-House Referral:     Discharge planning Services     Post Acute Care Choice:    Choice offered to:     DME Arranged:    DME Agency:     HH Arranged:    HH Agency:     Status of Service:  Completed, signed off  If discussed at [employer] Length of Stay Meetings, dates discussed:    Additional Comments:  Kermit BaloKelli F Jian Hodgman, RN 06/02/2017, 2:07 PM

## 2017-06-02 NOTE — Progress Notes (Signed)
PT Cancellation Note/Discharge  Patient Details Name: Haley PurlBrenda M [name] MRN: [id number] DOB: [birth year]   Cancelled Treatment:    Reason Eval/Treat Not Completed: PT screened, no needs identified, will sign off.  Spoke with OT who worked with pt earlier today and OT reports she does not have any PT needs as the pt is back to her baseline and symptom-free.  PT spoke with pt who agrees and is standing at the sink in her room independently washing up.  PT to sign off.    Thanks,    Rollene Rotundaebecca B. Kenda Kloehn, PT, DPT [phone]   06/02/2017, 1:15 PM

## 2017-06-02 NOTE — Progress Notes (Signed)
SLP Cancellation Note  Patient Details Name: [name] MRN: [id number] DOB: 1940-10-09   Cancelled treatment:       Reason Eval/Treat Not Completed: SLP screened, no needs identified, will sign off   [name], [name] 06/02/2017, 12:38 PM

## 2017-06-07 NOTE — Progress Notes (Signed)
Added Modified Rankin Score based on review of the medical record.   06/02/17 1528  Modified Rankin (Stroke Patients Only)  Pre-Morbid Rankin Score 1  Modified Rankin [address]   Hudson Majkowski Thatcher[place]awul[place], [place]  [phone] 06/07/2017

## 2017-08-26 DIAGNOSIS — M545 Low back pain: Secondary | ICD-10-CM | POA: Diagnosis not present

## 2017-08-26 DIAGNOSIS — G459 Transient cerebral ischemic attack, unspecified: Secondary | ICD-10-CM | POA: Diagnosis not present

## 2017-08-26 DIAGNOSIS — M5417 Radiculopathy, lumbosacral region: Secondary | ICD-10-CM | POA: Diagnosis not present

## 2017-09-06 ENCOUNTER — Other Ambulatory Visit: Payer: Self-pay

## 2017-09-06 DIAGNOSIS — I4510 Unspecified right bundle-branch block: Secondary | ICD-10-CM | POA: Diagnosis not present

## 2017-09-06 DIAGNOSIS — R479 Unspecified speech disturbances: Secondary | ICD-10-CM | POA: Diagnosis not present

## 2017-09-06 DIAGNOSIS — R51 Headache: Secondary | ICD-10-CM | POA: Diagnosis not present

## 2017-09-06 DIAGNOSIS — G43409 Hemiplegic migraine, not intractable, without status migrainosus: Secondary | ICD-10-CM | POA: Diagnosis not present

## 2017-09-07 ENCOUNTER — Other Ambulatory Visit: Payer: Self-pay

## 2017-09-08 DIAGNOSIS — G459 Transient cerebral ischemic attack, unspecified: Secondary | ICD-10-CM | POA: Diagnosis not present

## 2017-09-08 DIAGNOSIS — G464 Cerebellar stroke syndrome: Secondary | ICD-10-CM | POA: Diagnosis not present

## 2017-09-22 DIAGNOSIS — G43009 Migraine without aura, not intractable, without status migrainosus: Secondary | ICD-10-CM | POA: Diagnosis not present

## 2017-09-22 DIAGNOSIS — G459 Transient cerebral ischemic attack, unspecified: Secondary | ICD-10-CM | POA: Diagnosis not present

## 2017-09-22 DIAGNOSIS — G603 Idiopathic progressive neuropathy: Secondary | ICD-10-CM | POA: Diagnosis not present

## 2017-09-22 DIAGNOSIS — M5417 Radiculopathy, lumbosacral region: Secondary | ICD-10-CM | POA: Diagnosis not present

## 2017-10-01 DIAGNOSIS — E119 Type 2 diabetes mellitus without complications: Secondary | ICD-10-CM | POA: Diagnosis not present

## 2017-10-15 DIAGNOSIS — G43009 Migraine without aura, not intractable, without status migrainosus: Secondary | ICD-10-CM | POA: Diagnosis not present

## 2017-10-15 DIAGNOSIS — M545 Low back pain: Secondary | ICD-10-CM | POA: Diagnosis not present

## 2017-10-15 DIAGNOSIS — M5417 Radiculopathy, lumbosacral region: Secondary | ICD-10-CM | POA: Diagnosis not present

## 2017-10-15 DIAGNOSIS — G459 Transient cerebral ischemic attack, unspecified: Secondary | ICD-10-CM | POA: Diagnosis not present

## 2017-10-18 DIAGNOSIS — E119 Type 2 diabetes mellitus without complications: Secondary | ICD-10-CM | POA: Diagnosis not present

## 2017-10-18 DIAGNOSIS — Z95828 Presence of other vascular implants and grafts: Secondary | ICD-10-CM | POA: Diagnosis not present

## 2017-10-18 DIAGNOSIS — Z8673 Personal history of transient ischemic attack (TIA), and cerebral infarction without residual deficits: Secondary | ICD-10-CM | POA: Diagnosis not present

## 2017-10-20 DIAGNOSIS — E119 Type 2 diabetes mellitus without complications: Secondary | ICD-10-CM | POA: Diagnosis not present

## 2017-12-03 DIAGNOSIS — G603 Idiopathic progressive neuropathy: Secondary | ICD-10-CM | POA: Diagnosis not present

## 2017-12-03 DIAGNOSIS — G459 Transient cerebral ischemic attack, unspecified: Secondary | ICD-10-CM | POA: Diagnosis not present

## 2017-12-03 DIAGNOSIS — M5417 Radiculopathy, lumbosacral region: Secondary | ICD-10-CM | POA: Diagnosis not present

## 2018-01-24 DIAGNOSIS — E119 Type 2 diabetes mellitus without complications: Secondary | ICD-10-CM | POA: Diagnosis not present

## 2018-01-24 DIAGNOSIS — I10 Essential (primary) hypertension: Secondary | ICD-10-CM | POA: Diagnosis not present

## 2018-01-24 DIAGNOSIS — I252 Old myocardial infarction: Secondary | ICD-10-CM | POA: Diagnosis not present

## 2018-01-24 DIAGNOSIS — E785 Hyperlipidemia, unspecified: Secondary | ICD-10-CM | POA: Diagnosis not present

## 2018-02-07 DIAGNOSIS — G603 Idiopathic progressive neuropathy: Secondary | ICD-10-CM | POA: Diagnosis not present

## 2018-02-07 DIAGNOSIS — M542 Cervicalgia: Secondary | ICD-10-CM | POA: Diagnosis not present

## 2018-02-07 DIAGNOSIS — M5417 Radiculopathy, lumbosacral region: Secondary | ICD-10-CM | POA: Diagnosis not present

## 2018-02-07 DIAGNOSIS — G459 Transient cerebral ischemic attack, unspecified: Secondary | ICD-10-CM | POA: Diagnosis not present

## 2018-02-10 DIAGNOSIS — E119 Type 2 diabetes mellitus without complications: Secondary | ICD-10-CM | POA: Diagnosis not present

## 2018-02-10 DIAGNOSIS — I10 Essential (primary) hypertension: Secondary | ICD-10-CM | POA: Diagnosis not present

## 2018-02-13 DIAGNOSIS — R1010 Upper abdominal pain, unspecified: Secondary | ICD-10-CM | POA: Diagnosis not present

## 2018-02-13 DIAGNOSIS — Z7984 Long term (current) use of oral hypoglycemic drugs: Secondary | ICD-10-CM | POA: Diagnosis not present

## 2018-02-13 DIAGNOSIS — I2510 Atherosclerotic heart disease of native coronary artery without angina pectoris: Secondary | ICD-10-CM | POA: Diagnosis not present

## 2018-02-13 DIAGNOSIS — E119 Type 2 diabetes mellitus without complications: Secondary | ICD-10-CM | POA: Diagnosis not present

## 2018-02-13 DIAGNOSIS — R0602 Shortness of breath: Secondary | ICD-10-CM | POA: Diagnosis not present

## 2018-02-13 DIAGNOSIS — E785 Hyperlipidemia, unspecified: Secondary | ICD-10-CM | POA: Diagnosis not present

## 2018-02-13 DIAGNOSIS — Z888 Allergy status to other drugs, medicaments and biological substances status: Secondary | ICD-10-CM | POA: Diagnosis not present

## 2018-02-13 DIAGNOSIS — R072 Precordial pain: Secondary | ICD-10-CM | POA: Diagnosis not present

## 2018-02-13 DIAGNOSIS — Z79899 Other long term (current) drug therapy: Secondary | ICD-10-CM | POA: Diagnosis not present

## 2018-02-13 DIAGNOSIS — Z955 Presence of coronary angioplasty implant and graft: Secondary | ICD-10-CM | POA: Diagnosis not present

## 2018-02-13 DIAGNOSIS — I10 Essential (primary) hypertension: Secondary | ICD-10-CM | POA: Diagnosis not present

## 2018-02-13 DIAGNOSIS — R001 Bradycardia, unspecified: Secondary | ICD-10-CM | POA: Diagnosis not present

## 2018-02-13 DIAGNOSIS — R079 Chest pain, unspecified: Secondary | ICD-10-CM | POA: Diagnosis not present

## 2018-02-13 DIAGNOSIS — R61 Generalized hyperhidrosis: Secondary | ICD-10-CM | POA: Diagnosis not present

## 2018-02-13 DIAGNOSIS — I252 Old myocardial infarction: Secondary | ICD-10-CM | POA: Diagnosis not present

## 2018-02-14 DIAGNOSIS — I10 Essential (primary) hypertension: Secondary | ICD-10-CM | POA: Diagnosis not present

## 2018-02-14 DIAGNOSIS — E119 Type 2 diabetes mellitus without complications: Secondary | ICD-10-CM | POA: Diagnosis not present

## 2018-02-14 DIAGNOSIS — R072 Precordial pain: Secondary | ICD-10-CM | POA: Diagnosis not present

## 2018-02-14 DIAGNOSIS — I4510 Unspecified right bundle-branch block: Secondary | ICD-10-CM | POA: Diagnosis not present

## 2018-02-14 DIAGNOSIS — E785 Hyperlipidemia, unspecified: Secondary | ICD-10-CM | POA: Diagnosis not present

## 2018-02-14 DIAGNOSIS — K5730 Diverticulosis of large intestine without perforation or abscess without bleeding: Secondary | ICD-10-CM | POA: Diagnosis not present

## 2018-02-14 DIAGNOSIS — I2510 Atherosclerotic heart disease of native coronary artery without angina pectoris: Secondary | ICD-10-CM | POA: Diagnosis not present

## 2018-02-15 DIAGNOSIS — E119 Type 2 diabetes mellitus without complications: Secondary | ICD-10-CM | POA: Diagnosis not present

## 2018-02-17 DIAGNOSIS — M545 Low back pain: Secondary | ICD-10-CM | POA: Diagnosis not present

## 2018-02-17 DIAGNOSIS — K7689 Other specified diseases of liver: Secondary | ICD-10-CM | POA: Diagnosis not present

## 2018-02-17 DIAGNOSIS — E119 Type 2 diabetes mellitus without complications: Secondary | ICD-10-CM | POA: Diagnosis not present

## 2018-02-17 DIAGNOSIS — E785 Hyperlipidemia, unspecified: Secondary | ICD-10-CM | POA: Diagnosis not present

## 2018-02-17 DIAGNOSIS — I10 Essential (primary) hypertension: Secondary | ICD-10-CM | POA: Diagnosis not present

## 2018-02-18 DIAGNOSIS — M47892 Other spondylosis, cervical region: Secondary | ICD-10-CM | POA: Diagnosis not present

## 2018-02-18 DIAGNOSIS — M9971 Connective tissue and disc stenosis of intervertebral foramina of cervical region: Secondary | ICD-10-CM | POA: Diagnosis not present

## 2018-02-18 DIAGNOSIS — M542 Cervicalgia: Secondary | ICD-10-CM | POA: Diagnosis not present

## 2018-02-18 DIAGNOSIS — M47891 Other spondylosis, occipito-atlanto-axial region: Secondary | ICD-10-CM | POA: Diagnosis not present

## 2018-03-07 DIAGNOSIS — G603 Idiopathic progressive neuropathy: Secondary | ICD-10-CM | POA: Diagnosis not present

## 2018-03-07 DIAGNOSIS — G5601 Carpal tunnel syndrome, right upper limb: Secondary | ICD-10-CM | POA: Diagnosis not present

## 2018-03-07 DIAGNOSIS — G459 Transient cerebral ischemic attack, unspecified: Secondary | ICD-10-CM | POA: Diagnosis not present

## 2018-03-07 DIAGNOSIS — M5417 Radiculopathy, lumbosacral region: Secondary | ICD-10-CM | POA: Diagnosis not present

## 2018-03-22 DIAGNOSIS — I10 Essential (primary) hypertension: Secondary | ICD-10-CM | POA: Diagnosis not present

## 2018-03-24 DIAGNOSIS — E119 Type 2 diabetes mellitus without complications: Secondary | ICD-10-CM | POA: Diagnosis not present

## 2018-03-24 DIAGNOSIS — K7689 Other specified diseases of liver: Secondary | ICD-10-CM | POA: Diagnosis not present

## 2018-03-24 DIAGNOSIS — I10 Essential (primary) hypertension: Secondary | ICD-10-CM | POA: Diagnosis not present

## 2018-03-24 DIAGNOSIS — Z23 Encounter for immunization: Secondary | ICD-10-CM | POA: Diagnosis not present

## 2018-03-24 DIAGNOSIS — E785 Hyperlipidemia, unspecified: Secondary | ICD-10-CM | POA: Diagnosis not present

## 2018-04-05 DIAGNOSIS — R2689 Other abnormalities of gait and mobility: Secondary | ICD-10-CM | POA: Diagnosis not present

## 2018-04-05 DIAGNOSIS — M545 Low back pain: Secondary | ICD-10-CM | POA: Diagnosis not present

## 2018-04-05 DIAGNOSIS — M4722 Other spondylosis with radiculopathy, cervical region: Secondary | ICD-10-CM | POA: Diagnosis not present

## 2018-04-05 DIAGNOSIS — R293 Abnormal posture: Secondary | ICD-10-CM | POA: Diagnosis not present

## 2018-04-07 DIAGNOSIS — M545 Low back pain: Secondary | ICD-10-CM | POA: Diagnosis not present

## 2018-04-07 DIAGNOSIS — G459 Transient cerebral ischemic attack, unspecified: Secondary | ICD-10-CM | POA: Diagnosis not present

## 2018-04-07 DIAGNOSIS — G603 Idiopathic progressive neuropathy: Secondary | ICD-10-CM | POA: Diagnosis not present

## 2018-04-07 DIAGNOSIS — G43009 Migraine without aura, not intractable, without status migrainosus: Secondary | ICD-10-CM | POA: Diagnosis not present

## 2019-04-13 DIAGNOSIS — R42 Dizziness and giddiness: Secondary | ICD-10-CM

## 2019-04-13 HISTORY — DX: Dizziness and giddiness: R42

## 2019-10-16 ENCOUNTER — Other Ambulatory Visit: Payer: Self-pay | Admitting: Neurosurgery

## 2019-11-16 NOTE — Pre-Procedure Instructions (Signed)
[id number] - [place], [place] - [address] AT ACROSS FROM [address] [place] [address] Phone: [phone] Fax: [phone]     Your procedure is scheduled on Tuesday, May 11 from 09:00 AM- 12:17 PM.  Report to [facility] Main Entrance "A" at 07:00 A.M., and check in at the Admitting office.  Call this number if you have problems the morning of surgery:  [phone]  Call [phone] if you have any questions prior to your surgery date Monday-Friday 8am-4pm.    Remember:  Do not eat or drink after midnight the night before your surgery.    Take these medicines the morning of surgery with A SIP OF WATER : carvedilol (COREG) pantoprazole (PROTONIX)   IF NEEDED: HYDROcodone-acetaminophen (NORCO/VICODIN) ipratropium (ATROVENT) nasal spray  Follow your surgeon's instructions on when to stop clopidogrel (PLAVIX) and Aspirin.  If no instructions were given by your surgeon then you will need to call the office to get those instructions.    As of today, STOP taking any Aspirin containing products, Aleve, Naproxen, Ibuprofen, Motrin, Advil, Goody's, BC's, all herbal medications, fish oil, and all vitamins.   WHAT DO I DO ABOUT MY DIABETES MEDICATION?  . THE EVENING BEFORE SURGERY, do not take glimepiride (AMARYL). . THE MORNING OF SURGERY, do not take glimepiride (AMARYL) or metFORMIN (GLUCOPHAGE).   HOW TO MANAGE YOUR DIABETES BEFORE AND AFTER SURGERY  Why is it important to control my blood sugar before and after surgery? . Improving blood sugar levels before and after surgery helps healing and can limit problems. . A way of improving blood sugar control is eating a healthy diet by: o  Eating less sugar and carbohydrates o  Increasing activity/exercise o  Talking with your doctor about reaching your blood sugar goals . High blood sugars (greater than 180 mg/dL) can raise your risk of infections and slow your recovery, so you will  need to focus on controlling your diabetes during the weeks before surgery. . Make sure that the doctor who takes care of your diabetes knows about your planned surgery including the date and location.  How do I manage my blood sugar before surgery? . Check your blood sugar at least 4 times a day, starting 2 days before surgery, to make sure that the level is not too high or low. . Check your blood sugar the morning of your surgery when you wake up and every 2 hours until you get to the Short Stay unit. o If your blood sugar is less than 70 mg/dL, you will need to treat for low blood sugar: - Do not take insulin. - Treat a low blood sugar (less than 70 mg/dL) with  cup of clear juice (cranberry or apple), 4 glucose tablets, OR glucose gel. - Recheck blood sugar in 15 minutes after treatment (to make sure it is greater than 70 mg/dL). If your blood sugar is not greater than 70 mg/dL on recheck, call [phone] for further instructions. . Report your blood sugar to the short stay nurse when you get to Short Stay.  . If you are admitted to the hospital after surgery: o Your blood sugar will be checked by the staff and you will probably be given insulin after surgery (instead of oral diabetes medicines) to make sure you have good blood sugar levels. o The goal for blood sugar control after surgery is 80-180 mg/dL.  Do not wear jewelry, make up, or nail polish.            Do not wear lotions, powders, perfumes, or deodorant.            Do not shave 48 hours prior to surgery.              Do not bring valuables to the hospital.            [facility] is not responsible for any belongings or valuables.  Do NOT Smoke (Tobacco/Vapping) or drink Alcohol 24 hours prior to your procedure.  If you use a CPAP at night, you may bring all equipment for your overnight stay.   Contacts, glasses, dentures or bridgework may not be worn into surgery.      For patients admitted to the  hospital, discharge time will be determined by your treatment team.   Patients discharged the day of surgery will not be allowed to drive home, and someone needs to stay with them for 24 hours.    Special instructions:   [facility]- Preparing For Surgery  Before surgery, you can play an important role. Because skin is not sterile, your skin needs to be as free of germs as possible. You can reduce the number of germs on your skin by washing with CHG (chlorahexidine gluconate) Soap before surgery.  CHG is an antiseptic cleaner which kills germs and bonds with the skin to continue killing germs even after washing.    Oral Hygiene is also important to reduce your risk of infection.  Remember - BRUSH YOUR TEETH THE MORNING OF SURGERY WITH YOUR REGULAR TOOTHPASTE  Please do not use if you have an allergy to CHG or antibacterial soaps. If your skin becomes reddened/irritated stop using the CHG.  Do not shave (including legs and underarms) for at least 48 hours prior to first CHG shower. It is OK to shave your face.  Please follow these instructions carefully.   1. Shower the NIGHT BEFORE SURGERY and the MORNING OF SURGERY with CHG Soap.   2. If you chose to wash your hair, wash your hair first as usual with your normal shampoo.  3. After you shampoo, rinse your hair and body thoroughly to remove the shampoo.  4. Use CHG as you would any other liquid soap. You can apply CHG directly to the skin and wash gently with a scrungie or a clean washcloth.   5. Apply the CHG Soap to your body ONLY FROM THE NECK DOWN.  Do not use on open wounds or open sores. Avoid contact with your eyes, ears, mouth and genitals (private parts). Wash Face and genitals (private parts)  with your normal soap.   6. Wash thoroughly, paying special attention to the area where your surgery will be performed.  7. Thoroughly rinse your body with warm water from the neck down.  8. DO NOT shower/wash with your normal soap  after using and rinsing off the CHG Soap.  9. Pat yourself dry with a CLEAN TOWEL.  10. Wear CLEAN PAJAMAS to bed the night before surgery, wear comfortable clothes the morning of surgery  11. Place CLEAN SHEETS on your bed the night of your first shower and DO NOT SLEEP WITH PETS.   Day of Surgery:   Do not apply any deodorants/lotions.  Please wear clean clothes to the hospital/surgery center.   Remember to brush your teeth WITH YOUR REGULAR TOOTHPASTE.   Please read over the following fact  sheets that you were given.

## 2019-11-17 ENCOUNTER — Encounter (HOSPITAL_COMMUNITY): Payer: Self-pay

## 2019-11-17 ENCOUNTER — Encounter (HOSPITAL_COMMUNITY)
Admission: RE | Admit: 2019-11-17 | Discharge: 2019-11-17 | Disposition: A | Discharge status: Home / Self Care | Payer: Medicare Other | Source: Ambulatory Visit | Attending: Neurosurgery | Admitting: Neurosurgery

## 2019-11-17 ENCOUNTER — Other Ambulatory Visit (HOSPITAL_COMMUNITY)
Admission: RE | Admit: 2019-11-17 | Discharge: 2019-11-17 | Disposition: A | Discharge status: Home / Self Care | Payer: Medicare Other | Source: Ambulatory Visit | Attending: Neurosurgery | Admitting: Neurosurgery

## 2019-11-17 ENCOUNTER — Other Ambulatory Visit: Payer: Self-pay

## 2019-11-17 DIAGNOSIS — I2510 Atherosclerotic heart disease of native coronary artery without angina pectoris: Secondary | ICD-10-CM | POA: Insufficient documentation

## 2019-11-17 DIAGNOSIS — I4510 Unspecified right bundle-branch block: Secondary | ICD-10-CM | POA: Diagnosis not present

## 2019-11-17 DIAGNOSIS — I10 Essential (primary) hypertension: Secondary | ICD-10-CM | POA: Insufficient documentation

## 2019-11-17 DIAGNOSIS — Z7982 Long term (current) use of aspirin: Secondary | ICD-10-CM | POA: Diagnosis not present

## 2019-11-17 DIAGNOSIS — Z20822 Contact with and (suspected) exposure to covid-19: Secondary | ICD-10-CM | POA: Insufficient documentation

## 2019-11-17 DIAGNOSIS — I252 Old myocardial infarction: Secondary | ICD-10-CM | POA: Insufficient documentation

## 2019-11-17 DIAGNOSIS — E119 Type 2 diabetes mellitus without complications: Secondary | ICD-10-CM | POA: Insufficient documentation

## 2019-11-17 DIAGNOSIS — R001 Bradycardia, unspecified: Secondary | ICD-10-CM | POA: Insufficient documentation

## 2019-11-17 DIAGNOSIS — Z01818 Encounter for other preprocedural examination: Secondary | ICD-10-CM | POA: Diagnosis present

## 2019-11-17 DIAGNOSIS — Z955 Presence of coronary angioplasty implant and graft: Secondary | ICD-10-CM | POA: Diagnosis not present

## 2019-11-17 HISTORY — DX: Unspecified osteoarthritis, unspecified site: M19.90

## 2019-11-17 HISTORY — DX: Cerebral infarction, unspecified: I63.9

## 2019-11-17 HISTORY — DX: Atherosclerotic heart disease of native coronary artery without angina pectoris: I25.10

## 2019-11-17 HISTORY — DX: Dyspnea, unspecified: R06.00

## 2019-11-17 HISTORY — DX: Family history of other specified conditions: Z84.89

## 2019-11-17 HISTORY — DX: Gastro-esophageal reflux disease without esophagitis: K21.9

## 2019-11-17 LAB — BASIC METABOLIC PANEL
Anion gap: 10 (ref 5–15)
BUN: 14 mg/dL (ref 8–23)
CO2: 24 mmol/L (ref 22–32)
Calcium: 10.2 mg/dL (ref 8.9–10.3)
Chloride: 98 mmol/L (ref 98–111)
Creatinine, Ser: 0.98 mg/dL (ref 0.44–1.00)
GFR calc Af Amer: >60 mL/min (ref >60)
GFR calc non Af Amer: 55 mL/min — ABNORMAL LOW (ref >60)
Glucose, Bld: 249 mg/dL — ABNORMAL HIGH (ref 70–99)
Potassium: 5.5 mmol/L — ABNORMAL HIGH (ref 3.5–5.1)
Sodium: 132 mmol/L — ABNORMAL LOW (ref 135–145)

## 2019-11-17 LAB — HEMOGLOBIN A1C
Hgb A1c MFr Bld: 8.4 % — ABNORMAL HIGH (ref 4.8–5.6)
Mean Plasma Glucose: 194.38 mg/dL

## 2019-11-17 LAB — CBC
HCT: 39 % (ref 36.0–46.0)
Hemoglobin: 12.7 g/dL (ref 12.0–15.0)
MCH: 31.6 pg (ref 26.0–34.0)
MCHC: 32.6 g/dL (ref 30.0–36.0)
MCV: 97 fL (ref 80.0–100.0)
Platelets: 205 10*3/uL (ref 150–400)
RBC: 4.02 MIL/uL (ref 3.87–5.11)
RDW: 12.6 % (ref 11.5–15.5)
WBC: 4 10*3/uL (ref 4.0–10.5)
nRBC: 0 % (ref 0.0–0.2)

## 2019-11-17 LAB — TYPE AND SCREEN
ABO/RH(D): B POS
Antibody Screen: NEGATIVE

## 2019-11-17 LAB — SURGICAL PCR SCREEN
MRSA, PCR: NEGATIVE
Staphylococcus aureus: NEGATIVE

## 2019-11-17 LAB — ABO/RH: ABO/RH(D): B POS

## 2019-11-17 LAB — GLUCOSE, CAPILLARY: Glucose-Capillary: 257 mg/dL — ABNORMAL HIGH (ref 70–99)

## 2019-11-17 NOTE — Progress Notes (Addendum)
PCP - Liberty Handy, DO Cardiologist - Dyane Dustman, MD  PPM/ICD - Denies  Chest x-ray - N/A EKG - 11/17/19 Stress Test - 10/23/19 ECHO - 06/24/18 Cardiac Cath - 08/22/06  Sleep Study - Denies  Fasting Blood Sugar: 100-110 Checks Blood Sugar every morning. CBG during PAT visit was 257. Patient stated she ate at 0900, and already took her Glimipride and Metformin at 0900. A1C obtained.  Blood Thinner Instructions: Per Erie Noe with Dr. Fredrich Birks office, stop as of today 11/17/19 Aspirin Instructions: Per Erie Noe with Dr. Fredrich Birks office, stop as of today 11/17/19  ERAS Protcol - No PRE-SURGERY Ensure or G2- N/A  COVID TEST- 11/17/19   Anesthesia review: Cardiac hx.  Patient denies shortness of breath, fever, cough and chest pain at PAT appointment   All instructions explained to the patient, with a verbal understanding of the material. Patient agrees to go over the instructions while at home for a better understanding. Patient also instructed to self quarantine after being tested for COVID-19. The opportunity to ask questions was provided.

## 2019-11-18 LAB — SARS CORONAVIRUS 2 (TAT 6-24 HRS): SARS Coronavirus 2: NEGATIVE

## 2019-11-20 NOTE — Anesthesia Preprocedure Evaluation (Addendum)
Anesthesia Evaluation  Patient identified by MRN, date of birth, ID band Patient awake    Reviewed: Allergy & Precautions, H&P , NPO status , Patient's Chart, lab work & pertinent test results  History of Anesthesia Complications (+) Family history of anesthesia reaction  Airway Mallampati: II  TM Distance: >3 FB Neck ROM: Full    Dental no notable dental hx. (+) Teeth Intact, Missing, Partial Upper,    Pulmonary neg pulmonary ROS,    Pulmonary exam normal breath sounds clear to auscultation       Cardiovascular Exercise Tolerance: Good hypertension, Pt. on medications + Past MI and + Cardiac Stents  negative cardio ROS Normal cardiovascular exam Rhythm:Regular Rate:Normal     Neuro/Psych  Headaches, TIACVA, No Residual Symptoms negative neurological ROS  negative psych ROS   GI/Hepatic negative GI ROS, Neg liver ROS, hiatal hernia, GERD  Medicated,  Endo/Other  negative endocrine ROSdiabetes, Type 2  Renal/GU negative Renal ROS  negative genitourinary   Musculoskeletal negative musculoskeletal ROS (+)   Abdominal   Peds negative pediatric ROS (+)  Hematology negative hematology ROS (+)   Anesthesia Other Findings   Reproductive/Obstetrics negative OB ROS                           Anesthesia Physical Anesthesia Plan  ASA: III  Anesthesia Plan: General   Post-op Pain Management:    Induction: Intravenous  PONV Risk Score and Plan: 3 and Ondansetron and Treatment may vary due to age or medical condition  Airway Management Planned: Oral ETT  Additional Equipment:   Intra-op Plan:   Post-operative Plan: Extubation in OR  Informed Consent: I have reviewed the patients History and Physical, chart, labs and discussed the procedure including the risks, benefits and alternatives for the proposed anesthesia with the patient or authorized representative who has indicated his/her  understanding and acceptance.       Plan Discussed with: Anesthesiologist and CRNA  Anesthesia Plan Comments: (Follows with hx of CAD/MI s/p TAXUS stent to the LAD in 2005, and DES to the RCA in February 2008. Followed by cardiology at [facility] Dr. Elonda Husky. She was seen 10/03/19 to establish care (recently moved to the area). She reported some chest discomfort with exertion and nuclear stress and event monitor were ordered. She was also placed on Plavix in addition to her ASA (per note, additon of Plavix was also due to somewhat unclear history of possible TIAs). Nuclear stress was low risk, nonischemic. Monitor showed predominantly NSR.   Cleared by Dr. Mauricio Po per message 11/17/19, " the patient just had a normal stress Cardiolite and a benign telemetry monitor.  Therefore she can undergo surgery with a low risk of a serious cardiac complication.  She needs to stop Plavix for 7 days prior to surgery but can stay on baby aspirin." Copy on chart.  LD Plavix 5/7.  Preop labs reviewed, potassium mildly elevated 5.5, sodium mildly low at 132. DMII not well controlled, BG 249 and A1c 8.4. Will repeat istat chem 8 on DOS.   EKG 11/17/19: Sinus bradycardia. Rate 52. Right bundle branch block  Nuclear stress 10/23/19 (care everywhere): IMPRESSION: Essentially normal cardiac perfusion exam. Prognostically this is a low risk scan  TECHNIQUE: After the perfusion exam, gated images the left ventricle were analyzed by computer. Left ventricular ejection fraction was measured.  FINDINGS: Left ventricular ejection fraction is calculated to be 86 %.  IMPRESSION: Left ventricular ejection fraction of 86%.  TECHNIQUE: Gated imaging of the left ventricle was performed after the perfusion exam.  FINDINGS: Dynamic imaging of the left ventricle demonstrates no wall motion abnormalities.  IMPRESSION: Normal left ventricular wall motion.  Event monitor 10/17/19 (care everywhere): Normal sinus rhythm predominates  recording  Rare PACs and PVCs are noted  Sinus tachycardia is noted during daytime hours  Sinus bradycardia is noted during evening hours  CTA head/neck 10/10/19 (care everywhere): 1. No evidence of acute intracranial abnormality. However, CT is relatively insensitive for the detection of ischemia/infarct within the first 24-48 hours, and MRI may be indicated if there is high clinical suspicion. 2. Multifocal intracranial atherosclerosis without large vessel occlusion. Moderate to high-grade stenosis of the ophthalmic and proximal supraclinoid segments of the left internal carotid artery. 3. No high-grade atherosclerotic narrowing in the neck. 4. Right parotid nodule measuring up to 1.2 cm which is indeterminate by imaging and warrants further evaluation. Recommend outpatient ENT referral for further assessment.  TTE 06/01/17: - Left ventricle: The cavity size was normal. There was mild  concentric hypertrophy. Systolic function was normal. The  estimated ejection fraction was in the range of 55% to 60%. Wall  motion was normal; there were no regional wall motion  abnormalities. Doppler parameters are consistent with abnormal  left ventricular relaxation (grade 1 diastolic dysfunction).  - Aortic valve: There was trivial regurgitation.  - Mitral valve: Calcified annulus.  - Left atrium: The atrium was mildly dilated.   )      Anesthesia Quick Evaluation

## 2019-11-20 NOTE — Progress Notes (Addendum)
Anesthesia Chart Review:  Follows with hx of CAD/MI s/p TAXUS stent to the LAD in 2005, and DES to the RCA in February 2008. Followed by cardiology at [facility] Dr. Chales Abrahams. She was seen 10/03/19 to establish care (recently moved to the area). She reported some chest discomfort with exertion and nuclear stress and event monitor were ordered. She was also placed on Plavix in addition to her ASA (per note, additon of Plavix was also due to somewhat unclear history of possible TIAs). Nuclear stress was low risk, nonischemic. Monitor showed predominantly NSR.   Cleared by Dr. Leeann Must per message 11/17/19, " the patient just had a normal stress Cardiolite and a benign telemetry monitor.  Therefore she can undergo surgery with a low risk of a serious cardiac complication.  She needs to stop Plavix for 7 days prior to surgery but can stay on baby aspirin." Copy on chart.  LD Plavix 5/7.  Preop labs reviewed, potassium mildly elevated 5.5, sodium mildly low at 132. DMII not well controlled, BG 249 and A1c 8.4. Will repeat istat chem 8 on DOS.   EKG 11/17/19: Sinus bradycardia. Rate 52. Right bundle branch block  Nuclear stress 10/23/19 (care everywhere): IMPRESSION: Essentially normal cardiac perfusion exam. Prognostically this is a low risk scan  TECHNIQUE: After the perfusion exam, gated images the left ventricle were analyzed by computer. Left ventricular ejection fraction was measured.  FINDINGS: Left ventricular ejection fraction is calculated to be 86 %.  IMPRESSION: Left ventricular ejection fraction of 86%.  TECHNIQUE: Gated imaging of the left ventricle was performed after the perfusion exam.  FINDINGS: Dynamic imaging of the left ventricle demonstrates no wall motion abnormalities.  IMPRESSION: Normal left ventricular wall motion.  Event monitor 10/17/19 (care everywhere): Normal sinus rhythm predominates recording  Rare PACs and PVCs are noted  Sinus tachycardia is noted during daytime hours   Sinus bradycardia is noted during evening hours  CTA head/neck 10/10/19 (care everywhere): 1. No evidence of acute intracranial abnormality. However, CT is relatively insensitive for the detection of ischemia/infarct within the first 24-48 hours, and MRI may be indicated if there is high clinical suspicion. 2. Multifocal intracranial atherosclerosis without large vessel occlusion. Moderate to high-grade stenosis of the ophthalmic and proximal supraclinoid segments of the left internal carotid artery. 3. No high-grade atherosclerotic narrowing in the neck. 4. Right parotid nodule measuring up to 1.2 cm which is indeterminate by imaging and warrants further evaluation. Recommend outpatient ENT referral for further assessment.  TTE 06/01/17: - Left ventricle: The cavity size was normal. There was mild  concentric hypertrophy. Systolic function was normal. The  estimated ejection fraction was in the range of 55% to 60%. Wall  motion was normal; there were no regional wall motion  abnormalities. Doppler parameters are consistent with abnormal  left ventricular relaxation (grade 1 diastolic dysfunction).  - Aortic valve: There was trivial regurgitation.  - Mitral valve: Calcified annulus.  - Left atrium: The atrium was mildly dilated.    Zannie Cove [facility] Short Stay Center/Anesthesiology Phone [phone] 11/20/2019 12:55 PM

## 2019-11-21 ENCOUNTER — Inpatient Hospital Stay (HOSPITAL_COMMUNITY)
Admission: RE | Admit: 2019-11-21 | Discharge: 2019-11-22 | DRG: 460 | Disposition: A | Discharge status: Home Health | Payer: Medicare Other | Attending: Neurosurgery | Admitting: Neurosurgery

## 2019-11-21 ENCOUNTER — Inpatient Hospital Stay (HOSPITAL_COMMUNITY): Payer: Medicare Other | Admitting: Physician Assistant

## 2019-11-21 ENCOUNTER — Encounter (HOSPITAL_COMMUNITY)
Admission: RE | Disposition: A | Discharge status: Home Health | Payer: Self-pay | Source: Home / Self Care | Attending: Neurosurgery

## 2019-11-21 ENCOUNTER — Inpatient Hospital Stay (HOSPITAL_COMMUNITY): Payer: Medicare Other

## 2019-11-21 ENCOUNTER — Other Ambulatory Visit: Payer: Self-pay

## 2019-11-21 ENCOUNTER — Encounter (HOSPITAL_COMMUNITY): Payer: Self-pay | Admitting: Neurosurgery

## 2019-11-21 DIAGNOSIS — Z7982 Long term (current) use of aspirin: Secondary | ICD-10-CM | POA: Diagnosis not present

## 2019-11-21 DIAGNOSIS — Z419 Encounter for procedure for purposes other than remedying health state, unspecified: Secondary | ICD-10-CM

## 2019-11-21 DIAGNOSIS — M5117 Intervertebral disc disorders with radiculopathy, lumbosacral region: Secondary | ICD-10-CM | POA: Diagnosis present

## 2019-11-21 DIAGNOSIS — Z7984 Long term (current) use of oral hypoglycemic drugs: Secondary | ICD-10-CM

## 2019-11-21 DIAGNOSIS — M48062 Spinal stenosis, lumbar region with neurogenic claudication: Principal | ICD-10-CM | POA: Diagnosis present

## 2019-11-21 DIAGNOSIS — M48061 Spinal stenosis, lumbar region without neurogenic claudication: Secondary | ICD-10-CM | POA: Diagnosis present

## 2019-11-21 DIAGNOSIS — I10 Essential (primary) hypertension: Secondary | ICD-10-CM | POA: Diagnosis present

## 2019-11-21 DIAGNOSIS — M5416 Radiculopathy, lumbar region: Secondary | ICD-10-CM

## 2019-11-21 LAB — POCT I-STAT, CHEM 8
BUN: 15 mg/dL (ref 8–23)
Calcium, Ion: 1.19 mmol/L (ref 1.15–1.40)
Chloride: 103 mmol/L (ref 98–111)
Creatinine, Ser: 0.9 mg/dL (ref 0.44–1.00)
Glucose, Bld: 146 mg/dL — ABNORMAL HIGH (ref 70–99)
HCT: 34 % — ABNORMAL LOW (ref 36.0–46.0)
Hemoglobin: 11.6 g/dL — ABNORMAL LOW (ref 12.0–15.0)
Potassium: 4.7 mmol/L (ref 3.5–5.1)
Sodium: 131 mmol/L — ABNORMAL LOW (ref 135–145)
TCO2: 23 mmol/L (ref 22–32)

## 2019-11-21 LAB — GLUCOSE, CAPILLARY
Glucose-Capillary: 135 mg/dL — ABNORMAL HIGH (ref 70–99)
Glucose-Capillary: 162 mg/dL — ABNORMAL HIGH (ref 70–99)
Glucose-Capillary: 277 mg/dL — ABNORMAL HIGH (ref 70–99)
Glucose-Capillary: 271 mg/dL — ABNORMAL HIGH (ref 70–99)

## 2019-11-21 SURGERY — POSTERIOR LUMBAR FUSION 1 LEVEL
Anesthesia: General | Site: Back

## 2019-11-21 MED ORDER — CLOPIDOGREL BISULFATE 75 MG PO TABS
75.0000 mg | ORAL_TABLET | Freq: Every day | ORAL | Status: DC
  Filled 2019-11-21: qty 1

## 2019-11-21 MED ORDER — MEPERIDINE HCL 25 MG/ML IJ SOLN: 6.2500 mg | INTRAMUSCULAR | Status: DC | PRN

## 2019-11-21 MED ORDER — GLIMEPIRIDE 2 MG PO TABS
4.0000 mg | ORAL_TABLET | Freq: Two times a day (BID) | ORAL | Status: DC
  Administered 2019-11-21 – 2019-11-22 (×2): 4 mg via ORAL
  Filled 2019-11-21 (×2): qty 2

## 2019-11-21 MED ORDER — HYDROMORPHONE HCL 1 MG/ML IJ SOLN
0.5000 mg | INTRAMUSCULAR | Status: DC | PRN
  Administered 2019-11-21: 0.5 mg via INTRAVENOUS
  Filled 2019-11-21: qty 0.5

## 2019-11-21 MED ORDER — HYDROCODONE-ACETAMINOPHEN 10-325 MG PO TABS
1.0000 | ORAL_TABLET | ORAL | Status: DC | PRN
  Administered 2019-11-21 – 2019-11-22 (×4): 1 via ORAL
  Filled 2019-11-21 (×4): qty 1

## 2019-11-21 MED ORDER — PANTOPRAZOLE SODIUM 40 MG IV SOLR: 40.0000 mg | Freq: Every day | INTRAVENOUS | Status: DC

## 2019-11-21 MED ORDER — FENTANYL CITRATE (PF) 250 MCG/5ML IJ SOLN
INTRAMUSCULAR | Status: AC
  Filled 2019-11-21: qty 5

## 2019-11-21 MED ORDER — PHENOL 1.4 % MT LIQD: 1.0000 | OROMUCOSAL | Status: DC | PRN

## 2019-11-21 MED ORDER — POLYETHYLENE GLYCOL 3350 17 G PO PACK: 17.0000 g | PACK | Freq: Every day | ORAL | Status: DC | PRN

## 2019-11-21 MED ORDER — ACETAMINOPHEN 650 MG RE SUPP: 650.0000 mg | RECTAL | Status: DC | PRN

## 2019-11-21 MED ORDER — IPRATROPIUM BROMIDE 0.06 % NA SOLN
2.0000 | Freq: Two times a day (BID) | NASAL | Status: DC | PRN
  Filled 2019-11-21: qty 15

## 2019-11-21 MED ORDER — FENTANYL CITRATE (PF) 100 MCG/2ML IJ SOLN
25.0000 ug | INTRAMUSCULAR | Status: DC | PRN
  Administered 2019-11-21 (×2): 25 ug via INTRAVENOUS

## 2019-11-21 MED ORDER — ONDANSETRON HCL 4 MG/2ML IJ SOLN
INTRAMUSCULAR | Status: DC | PRN
  Administered 2019-11-21: 4 mg via INTRAVENOUS

## 2019-11-21 MED ORDER — WHITE PETROLATUM EX OINT
TOPICAL_OINTMENT | CUTANEOUS | Status: AC
  Filled 2019-11-21: qty 28.35

## 2019-11-21 MED ORDER — ROCURONIUM BROMIDE 10 MG/ML (PF) SYRINGE
PREFILLED_SYRINGE | INTRAVENOUS | Status: DC | PRN
  Administered 2019-11-21: 50 mg via INTRAVENOUS

## 2019-11-21 MED ORDER — ONDANSETRON HCL 4 MG/2ML IJ SOLN: 4.0000 mg | Freq: Four times a day (QID) | INTRAMUSCULAR | Status: DC | PRN

## 2019-11-21 MED ORDER — KCL IN DEXTROSE-NACL 20-5-0.45 MEQ/L-%-% IV SOLN: INTRAVENOUS | Status: DC

## 2019-11-21 MED ORDER — 0.9 % SODIUM CHLORIDE (POUR BTL) OPTIME
TOPICAL | Status: DC | PRN
  Administered 2019-11-21: 1000 mL

## 2019-11-21 MED ORDER — METHOCARBAMOL 1000 MG/10ML IJ SOLN
500.0000 mg | Freq: Four times a day (QID) | INTRAVENOUS | Status: DC | PRN
  Filled 2019-11-21: qty 5

## 2019-11-21 MED ORDER — THROMBIN 20000 UNITS EX SOLR
CUTANEOUS | Status: AC
  Filled 2019-11-21: qty 20000

## 2019-11-21 MED ORDER — INSULIN ASPART 100 UNIT/ML ~~LOC~~ SOLN: 0.0000 [IU] | Freq: Three times a day (TID) | SUBCUTANEOUS | Status: DC

## 2019-11-21 MED ORDER — METFORMIN HCL 500 MG PO TABS: 500.0000 mg | ORAL_TABLET | ORAL | Status: DC

## 2019-11-21 MED ORDER — PHENYLEPHRINE HCL-NACL 10-0.9 MG/250ML-% IV SOLN
INTRAVENOUS | Status: DC | PRN
  Administered 2019-11-21: 25 ug/min via INTRAVENOUS

## 2019-11-21 MED ORDER — CARVEDILOL 25 MG PO TABS
25.0000 mg | ORAL_TABLET | Freq: Two times a day (BID) | ORAL | Status: DC
  Administered 2019-11-21 – 2019-11-22 (×2): 25 mg via ORAL
  Filled 2019-11-21 (×2): qty 1

## 2019-11-21 MED ORDER — FLEET ENEMA 7-19 GM/118ML RE ENEM: 1.0000 | ENEMA | Freq: Once | RECTAL | Status: DC | PRN

## 2019-11-21 MED ORDER — ALUM & MAG HYDROXIDE-SIMETH 200-200-20 MG/5ML PO SUSP: 30.0000 mL | Freq: Four times a day (QID) | ORAL | Status: DC | PRN

## 2019-11-21 MED ORDER — ACETAMINOPHEN 325 MG PO TABS: 650.0000 mg | ORAL_TABLET | ORAL | Status: DC | PRN

## 2019-11-21 MED ORDER — BISACODYL 10 MG RE SUPP: 10.0000 mg | Freq: Every day | RECTAL | Status: DC | PRN

## 2019-11-21 MED ORDER — METFORMIN HCL 500 MG PO TABS
500.0000 mg | ORAL_TABLET | Freq: Every day | ORAL | Status: DC
  Administered 2019-11-22: 500 mg via ORAL
  Filled 2019-11-21: qty 1

## 2019-11-21 MED ORDER — HYDROCODONE-ACETAMINOPHEN 10-325 MG PO TABS
2.0000 | ORAL_TABLET | ORAL | Status: DC | PRN
  Administered 2019-11-21: 2 via ORAL
  Filled 2019-11-21: qty 2

## 2019-11-21 MED ORDER — SPIRONOLACTONE 25 MG PO TABS
25.0000 mg | ORAL_TABLET | Freq: Two times a day (BID) | ORAL | Status: DC
  Filled 2019-11-21 (×3): qty 1

## 2019-11-21 MED ORDER — THROMBIN 5000 UNITS EX SOLR
OROMUCOSAL | Status: DC | PRN
  Administered 2019-11-21: 5 mL

## 2019-11-21 MED ORDER — CEFAZOLIN SODIUM-DEXTROSE 2-4 GM/100ML-% IV SOLN
INTRAVENOUS | Status: AC
  Filled 2019-11-21: qty 100

## 2019-11-21 MED ORDER — SUGAMMADEX SODIUM 200 MG/2ML IV SOLN
INTRAVENOUS | Status: DC | PRN
  Administered 2019-11-21: 200 mg via INTRAVENOUS

## 2019-11-21 MED ORDER — DOCUSATE SODIUM 100 MG PO CAPS
100.0000 mg | ORAL_CAPSULE | Freq: Two times a day (BID) | ORAL | Status: DC
  Administered 2019-11-21 – 2019-11-22 (×2): 100 mg via ORAL
  Filled 2019-11-21 (×2): qty 1

## 2019-11-21 MED ORDER — PROPOFOL 10 MG/ML IV BOLUS
INTRAVENOUS | Status: AC
  Filled 2019-11-21: qty 20

## 2019-11-21 MED ORDER — LIDOCAINE 2% (20 MG/ML) 5 ML SYRINGE
INTRAMUSCULAR | Status: AC
  Filled 2019-11-21: qty 5

## 2019-11-21 MED ORDER — CEFAZOLIN SODIUM-DEXTROSE 2-4 GM/100ML-% IV SOLN
2.0000 g | INTRAVENOUS | Status: AC
  Administered 2019-11-21: 09:00:00 2 g via INTRAVENOUS

## 2019-11-21 MED ORDER — ACETAMINOPHEN 160 MG/5ML PO SOLN: 325.0000 mg | ORAL | Status: DC | PRN

## 2019-11-21 MED ORDER — EPHEDRINE 5 MG/ML INJ
INTRAVENOUS | Status: AC
  Filled 2019-11-21: qty 10

## 2019-11-21 MED ORDER — PHENYLEPHRINE 40 MCG/ML (10ML) SYRINGE FOR IV PUSH (FOR BLOOD PRESSURE SUPPORT)
PREFILLED_SYRINGE | INTRAVENOUS | Status: DC | PRN
  Administered 2019-11-21 (×2): 80 ug via INTRAVENOUS

## 2019-11-21 MED ORDER — CEFAZOLIN SODIUM-DEXTROSE 2-4 GM/100ML-% IV SOLN
2.0000 g | Freq: Three times a day (TID) | INTRAVENOUS | Status: AC
  Administered 2019-11-21 – 2019-11-22 (×2): 2 g via INTRAVENOUS
  Filled 2019-11-21 (×2): qty 100

## 2019-11-21 MED ORDER — LIDOCAINE 2% (20 MG/ML) 5 ML SYRINGE
INTRAMUSCULAR | Status: DC | PRN
  Administered 2019-11-21: 60 mg via INTRAVENOUS

## 2019-11-21 MED ORDER — LIDOCAINE-EPINEPHRINE 1 %-1:100000 IJ SOLN
INTRAMUSCULAR | Status: AC
  Filled 2019-11-21: qty 1

## 2019-11-21 MED ORDER — BUPIVACAINE LIPOSOME 1.3 % IJ SUSP
20.0000 mL | Freq: Once | INTRAMUSCULAR | Status: DC
  Filled 2019-11-21: qty 20

## 2019-11-21 MED ORDER — CINNAMON 500 MG PO CAPS: 500.0000 mg | ORAL_CAPSULE | Freq: Every day | ORAL | Status: DC

## 2019-11-21 MED ORDER — OXYCODONE HCL 5 MG PO TABS: 5.0000 mg | ORAL_TABLET | Freq: Once | ORAL | Status: DC | PRN

## 2019-11-21 MED ORDER — PHENYLEPHRINE 40 MCG/ML (10ML) SYRINGE FOR IV PUSH (FOR BLOOD PRESSURE SUPPORT)
PREFILLED_SYRINGE | INTRAVENOUS | Status: AC
  Filled 2019-11-21: qty 10

## 2019-11-21 MED ORDER — ZOLPIDEM TARTRATE 5 MG PO TABS: 5.0000 mg | ORAL_TABLET | Freq: Every evening | ORAL | Status: DC | PRN

## 2019-11-21 MED ORDER — EPHEDRINE SULFATE-NACL 50-0.9 MG/10ML-% IV SOSY
PREFILLED_SYRINGE | INTRAVENOUS | Status: DC | PRN
  Administered 2019-11-21: 10 mg via INTRAVENOUS

## 2019-11-21 MED ORDER — FENTANYL CITRATE (PF) 100 MCG/2ML IJ SOLN
INTRAMUSCULAR | Status: AC
  Filled 2019-11-21: qty 2

## 2019-11-21 MED ORDER — OXYCODONE HCL 5 MG/5ML PO SOLN: 5.0000 mg | Freq: Once | ORAL | Status: DC | PRN

## 2019-11-21 MED ORDER — THROMBIN 5000 UNITS EX SOLR
CUTANEOUS | Status: AC
  Filled 2019-11-21: qty 5000

## 2019-11-21 MED ORDER — PANTOPRAZOLE SODIUM 40 MG PO TBEC
40.0000 mg | DELAYED_RELEASE_TABLET | Freq: Two times a day (BID) | ORAL | Status: DC
  Administered 2019-11-21 – 2019-11-22 (×2): 40 mg via ORAL
  Filled 2019-11-21 (×2): qty 1

## 2019-11-21 MED ORDER — METFORMIN HCL 500 MG PO TABS
1000.0000 mg | ORAL_TABLET | Freq: Every day | ORAL | Status: DC
  Administered 2019-11-21: 1000 mg via ORAL
  Filled 2019-11-21: qty 2

## 2019-11-21 MED ORDER — INSULIN ASPART 100 UNIT/ML ~~LOC~~ SOLN
0.0000 [IU] | Freq: Every day | SUBCUTANEOUS | Status: DC
  Administered 2019-11-21: 3 [IU] via SUBCUTANEOUS

## 2019-11-21 MED ORDER — ORAL CARE MOUTH RINSE: 15.0000 mL | Freq: Once | OROMUCOSAL | Status: AC

## 2019-11-21 MED ORDER — CHLORHEXIDINE GLUCONATE 0.12 % MT SOLN
15.0000 mL | Freq: Once | OROMUCOSAL | Status: AC
  Administered 2019-11-21: 15 mL via OROMUCOSAL
  Filled 2019-11-21: qty 15

## 2019-11-21 MED ORDER — ACETAMINOPHEN 325 MG PO TABS: 325.0000 mg | ORAL_TABLET | ORAL | Status: DC | PRN

## 2019-11-21 MED ORDER — BUPIVACAINE HCL (PF) 0.5 % IJ SOLN
INTRAMUSCULAR | Status: DC | PRN
  Administered 2019-11-21: 10 mL

## 2019-11-21 MED ORDER — ONDANSETRON HCL 4 MG/2ML IJ SOLN: 4.0000 mg | Freq: Once | INTRAMUSCULAR | Status: DC | PRN

## 2019-11-21 MED ORDER — ONDANSETRON HCL 4 MG/2ML IJ SOLN
INTRAMUSCULAR | Status: AC
  Filled 2019-11-21: qty 2

## 2019-11-21 MED ORDER — SODIUM CHLORIDE 0.9% FLUSH: 3.0000 mL | INTRAVENOUS | Status: DC | PRN

## 2019-11-21 MED ORDER — HYDROCODONE-ACETAMINOPHEN 5-325 MG PO TABS: 1.0000 | ORAL_TABLET | Freq: Four times a day (QID) | ORAL | Status: DC | PRN

## 2019-11-21 MED ORDER — DEXAMETHASONE SODIUM PHOSPHATE 10 MG/ML IJ SOLN
INTRAMUSCULAR | Status: AC
  Filled 2019-11-21: qty 1

## 2019-11-21 MED ORDER — DIPHENHYDRAMINE HCL 25 MG PO CAPS: 25.0000 mg | ORAL_CAPSULE | Freq: Two times a day (BID) | ORAL | Status: DC | PRN

## 2019-11-21 MED ORDER — DEXAMETHASONE SODIUM PHOSPHATE 10 MG/ML IJ SOLN
INTRAMUSCULAR | Status: DC | PRN
  Administered 2019-11-21: 5 mg via INTRAVENOUS

## 2019-11-21 MED ORDER — ASPIRIN EC 81 MG PO TBEC
81.0000 mg | DELAYED_RELEASE_TABLET | Freq: Every day | ORAL | Status: DC
  Administered 2019-11-21: 81 mg via ORAL
  Filled 2019-11-21: qty 1

## 2019-11-21 MED ORDER — LIDOCAINE-EPINEPHRINE 1 %-1:100000 IJ SOLN
INTRAMUSCULAR | Status: DC | PRN
  Administered 2019-11-21: 10 mL

## 2019-11-21 MED ORDER — LACTATED RINGERS IV SOLN: INTRAVENOUS | Status: DC

## 2019-11-21 MED ORDER — PROPOFOL 10 MG/ML IV BOLUS
INTRAVENOUS | Status: DC | PRN
  Administered 2019-11-21: 100 mg via INTRAVENOUS

## 2019-11-21 MED ORDER — MENTHOL 3 MG MT LOZG: 1.0000 | LOZENGE | OROMUCOSAL | Status: DC | PRN

## 2019-11-21 MED ORDER — BUPIVACAINE HCL (PF) 0.5 % IJ SOLN
INTRAMUSCULAR | Status: AC
  Filled 2019-11-21: qty 30

## 2019-11-21 MED ORDER — ROCURONIUM BROMIDE 10 MG/ML (PF) SYRINGE
PREFILLED_SYRINGE | INTRAVENOUS | Status: AC
  Filled 2019-11-21: qty 10

## 2019-11-21 MED ORDER — IBUPROFEN 200 MG PO TABS: 400.0000 mg | ORAL_TABLET | Freq: Every day | ORAL | Status: DC | PRN

## 2019-11-21 MED ORDER — CHLORHEXIDINE GLUCONATE CLOTH 2 % EX PADS: 6.0000 | MEDICATED_PAD | Freq: Once | CUTANEOUS | Status: DC

## 2019-11-21 MED ORDER — PRAVASTATIN SODIUM 40 MG PO TABS
40.0000 mg | ORAL_TABLET | Freq: Every day | ORAL | Status: DC
  Administered 2019-11-21: 40 mg via ORAL
  Filled 2019-11-21: qty 1

## 2019-11-21 MED ORDER — ONDANSETRON HCL 4 MG PO TABS: 4.0000 mg | ORAL_TABLET | Freq: Four times a day (QID) | ORAL | Status: DC | PRN

## 2019-11-21 MED ORDER — OXYCODONE HCL 5 MG PO TABS: 5.0000 mg | ORAL_TABLET | ORAL | Status: DC | PRN

## 2019-11-21 MED ORDER — FENTANYL CITRATE (PF) 250 MCG/5ML IJ SOLN
INTRAMUSCULAR | Status: DC | PRN
  Administered 2019-11-21 (×2): 50 ug via INTRAVENOUS

## 2019-11-21 MED ORDER — METHOCARBAMOL 500 MG PO TABS
500.0000 mg | ORAL_TABLET | Freq: Four times a day (QID) | ORAL | Status: DC | PRN
  Administered 2019-11-21 – 2019-11-22 (×3): 500 mg via ORAL
  Filled 2019-11-21 (×3): qty 1

## 2019-11-21 MED ORDER — SODIUM CHLORIDE 0.9% FLUSH: 3.0000 mL | Freq: Two times a day (BID) | INTRAVENOUS | Status: DC

## 2019-11-21 MED ORDER — SODIUM CHLORIDE 0.9 % IV SOLN: 250.0000 mL | INTRAVENOUS | Status: DC

## 2019-11-21 MED ORDER — VITAMIN D3 25 MCG (1000 UNIT) PO TABS
1000.0000 [IU] | ORAL_TABLET | Freq: Every day | ORAL | Status: DC
  Administered 2019-11-22: 1000 [IU] via ORAL
  Filled 2019-11-21 (×2): qty 1

## 2019-11-21 MED ORDER — INSULIN ASPART 100 UNIT/ML ~~LOC~~ SOLN
0.0000 [IU] | Freq: Three times a day (TID) | SUBCUTANEOUS | Status: DC
  Administered 2019-11-21: 8 [IU] via SUBCUTANEOUS
  Administered 2019-11-22: 3 [IU] via SUBCUTANEOUS

## 2019-11-21 SURGICAL SUPPLY — 70 items
BASKET BONE COLLECTION (BASKET) ×2 IMPLANT
BENZOIN TINCTURE PRP APPL 2/3 (GAUZE/BANDAGES/DRESSINGS) ×2 IMPLANT
BLADE CLIPPER SURG (BLADE) IMPLANT
BUR MATCHSTICK NEURO 3.0 LAGG (BURR) ×2 IMPLANT
BUR PRECISION FLUTE 5.0 (BURR) ×2 IMPLANT
CANISTER SUCT 3000ML PPV (MISCELLANEOUS) ×2 IMPLANT
CARTRIDGE OIL MAESTRO DRILL (MISCELLANEOUS) ×1 IMPLANT
CNTNR URN SCR LID CUP LEK RST (MISCELLANEOUS) ×1 IMPLANT
CONT SPEC 4OZ STRL OR WHT (MISCELLANEOUS) ×2
COVER BACK TABLE 60X90IN (DRAPES) ×2 IMPLANT
COVER WAND RF STERILE (DRAPES) ×2 IMPLANT
DECANTER SPIKE VIAL GLASS SM (MISCELLANEOUS) ×2 IMPLANT
DERMABOND ADVANCED (GAUZE/BANDAGES/DRESSINGS) ×1
DERMABOND ADVANCED .7 DNX12 (GAUZE/BANDAGES/DRESSINGS) ×1 IMPLANT
DIFFUSER DRILL AIR PNEUMATIC (MISCELLANEOUS) ×2 IMPLANT
DRAPE C-ARM 42X72 X-RAY (DRAPES) ×2 IMPLANT
DRAPE C-ARMOR (DRAPES) ×2 IMPLANT
DRAPE LAPAROTOMY 100X72X124 (DRAPES) ×2 IMPLANT
DRAPE SURG 17X23 STRL (DRAPES) ×2 IMPLANT
DRSG OPSITE POSTOP 4X6 (GAUZE/BANDAGES/DRESSINGS) ×2 IMPLANT
DURAPREP 26ML APPLICATOR (WOUND CARE) ×2 IMPLANT
ELECT REM PT RETURN 9FT ADLT (ELECTROSURGICAL) ×2
ELECTRODE REM PT RTRN 9FT ADLT (ELECTROSURGICAL) ×1 IMPLANT
EVACUATOR 1/8 PVC DRAIN (DRAIN) IMPLANT
GAUZE 4X4 16PLY RFD (DISPOSABLE) IMPLANT
GAUZE SPONGE 4X4 12PLY STRL (GAUZE/BANDAGES/DRESSINGS) ×2 IMPLANT
GLOVE BIO SURGEON STRL SZ8 (GLOVE) ×4 IMPLANT
GLOVE BIOGEL PI IND STRL 8 (GLOVE) ×2 IMPLANT
GLOVE BIOGEL PI IND STRL 8.5 (GLOVE) ×2 IMPLANT
GLOVE BIOGEL PI INDICATOR 8 (GLOVE) ×2
GLOVE BIOGEL PI INDICATOR 8.5 (GLOVE) ×2
GLOVE ECLIPSE 8.0 STRL XLNG CF (GLOVE) ×4 IMPLANT
GLOVE EXAM NITRILE XL STR (GLOVE) IMPLANT
GOWN STRL REUS W/ TWL LRG LVL3 (GOWN DISPOSABLE) IMPLANT
GOWN STRL REUS W/ TWL XL LVL3 (GOWN DISPOSABLE) ×2 IMPLANT
GOWN STRL REUS W/TWL 2XL LVL3 (GOWN DISPOSABLE) ×4 IMPLANT
GOWN STRL REUS W/TWL LRG LVL3 (GOWN DISPOSABLE)
GOWN STRL REUS W/TWL XL LVL3 (GOWN DISPOSABLE) ×4
HEMOSTAT POWDER KIT SURGIFOAM (HEMOSTASIS) ×2 IMPLANT
KIT BASIN OR (CUSTOM PROCEDURE TRAY) ×2 IMPLANT
KIT INFUSE XX SMALL 0.7CC (Orthopedic Implant) ×2 IMPLANT
KIT POSITION SURG JACKSON T1 (MISCELLANEOUS) ×2 IMPLANT
KIT TURNOVER KIT B (KITS) ×2 IMPLANT
MILL MEDIUM DISP (BLADE) ×2 IMPLANT
NEEDLE HYPO 25X1 1.5 SAFETY (NEEDLE) ×2 IMPLANT
NEEDLE SPNL 18GX3.5 QUINCKE PK (NEEDLE) IMPLANT
NS IRRIG 1000ML POUR BTL (IV SOLUTION) ×2 IMPLANT
OIL CARTRIDGE MAESTRO DRILL (MISCELLANEOUS) ×2
PACK LAMINECTOMY NEURO (CUSTOM PROCEDURE TRAY) ×2 IMPLANT
PAD ARMBOARD 7.5X6 YLW CONV (MISCELLANEOUS) ×6 IMPLANT
PATTIES SURGICAL .5 X.5 (GAUZE/BANDAGES/DRESSINGS) IMPLANT
PATTIES SURGICAL .5 X1 (DISPOSABLE) IMPLANT
PATTIES SURGICAL 1X1 (DISPOSABLE) IMPLANT
ROD RELINE LORDOTIC 5.5X45 (Rod) ×4 IMPLANT
SCREW LOCK RELINE 5.5 TULIP (Screw) ×8 IMPLANT
SCREW RELINE-O POLY 6.5X40 (Screw) ×4 IMPLANT
SCREW RELINE-O POLY 6.5X45 (Screw) ×4 IMPLANT
SPONGE LAP 4X18 RFD (DISPOSABLE) IMPLANT
SPONGE SURGIFOAM ABS GEL 100 (HEMOSTASIS) IMPLANT
STAPLER SKIN PROX WIDE 3.9 (STAPLE) IMPLANT
STRIP CLOSURE SKIN 1/2X4 (GAUZE/BANDAGES/DRESSINGS) ×2 IMPLANT
SUT VIC AB 1 CT1 18XBRD ANBCTR (SUTURE) ×2 IMPLANT
SUT VIC AB 1 CT1 8-18 (SUTURE) ×4
SUT VIC AB 2-0 CT1 18 (SUTURE) ×4 IMPLANT
SUT VIC AB 3-0 SH 8-18 (SUTURE) ×4 IMPLANT
SYR 5ML LL (SYRINGE) IMPLANT
TOWEL GREEN STERILE (TOWEL DISPOSABLE) ×2 IMPLANT
TOWEL GREEN STERILE FF (TOWEL DISPOSABLE) ×2 IMPLANT
TRAY FOLEY MTR SLVR 16FR STAT (SET/KITS/TRAYS/PACK) ×2 IMPLANT
WATER STERILE IRR 1000ML POUR (IV SOLUTION) ×2 IMPLANT

## 2019-11-21 NOTE — Anesthesia Postprocedure Evaluation (Signed)
Anesthesia Post Note  Patient: [name]  Procedure(s) Performed: Lumbar Five Sacral One Redo decompression/fusion (N/A Back)     Patient location during evaluation: PACU Anesthesia Type: General Level of consciousness: awake and alert Pain management: pain level controlled Vital Signs Assessment: post-procedure vital signs reviewed and stable Respiratory status: spontaneous breathing, nonlabored ventilation, respiratory function stable and patient connected to nasal cannula oxygen Cardiovascular status: blood pressure returned to baseline and stable Postop Assessment: no apparent nausea or vomiting Anesthetic complications: no    Last Vitals:  Vitals:   11/21/19 1324 11/21/19 1325  BP: (!) 179/73 (!) 179/73  Pulse: 64 (!) 54  Resp: 19 19  Temp:    SpO2: 100% 100%    Last Pain:  Vitals:   11/21/19 1353  TempSrc:   PainSc: 9                  Donn Wilmot

## 2019-11-21 NOTE — Evaluation (Signed)
Physical Therapy Evaluation Patient Details Name: [name] MRN: [id number] DOB: 10-Aug-1940 Today's Date: 11/21/2019   History of Present Illness  Patient is 79 year old woman with recurrent disc herniation, spondylosis, lumbar stenosis L 5 S 1 left. She has a severe left S 1 radiculopathy.Other PMH: TIA, CVA, MI, HTN, GERD, DM2, CAD, OA.  Admitted for L5-S1 PLIF.   Clinical Impression  Pt admitted with above diagnosis. Pt ambulated 150' with RW and min-guard A. Began without AD but reliant on IV pole, rec RW for home. Pt reports she lives home alone and no one can stay with her first few nights. Mentioned possible caregiver service, CSW needed for this? Pt also relays R shoulder discomfort and inability to lift above shoulder height, rec outpt ortho consult when able.  Pt currently with functional limitations due to the deficits listed below (see PT Problem List). Pt will benefit from skilled PT to increase their independence and safety with mobility to allow discharge to the venue listed below.       Follow Up Recommendations Home health PT    Equipment Recommendations  Rolling walker with 5" wheels    Recommendations for Other Services       Precautions / Restrictions Precautions Precautions: Back Precaution Booklet Issued: Yes (comment) Precaution Comments: reviewed precautions Required Braces or Orthoses: Spinal Brace Spinal Brace: Lumbar corset;Applied in sitting position Restrictions Weight Bearing Restrictions: No      Mobility  Bed Mobility Overal bed mobility: Needs Assistance Bed Mobility: Rolling;Sidelying to Sit;Sit to Sidelying Rolling: Supervision Sidelying to sit: Min assist     Sit to sidelying: Min assist General bed mobility comments: vc's for log roll. Min A for last segment of trunk elevation. Min A to LE's for return to supine  Transfers Overall transfer level: Needs assistance Equipment used: None Transfers: Sit to/from Stand Sit to Stand:  Min guard         General transfer comment: min-guard A for safety  Ambulation/Gait Ambulation/Gait assistance: Min guard Gait Distance (Feet): 150 Feet Assistive device: Rolling walker (2 wheeled);IV Pole Gait Pattern/deviations: Step-through pattern;Decreased stride length Gait velocity: decreased Gait velocity interpretation: 1.31 - 2.62 ft/sec, indicative of limited community ambulator General Gait Details: pt began pushing IV pole, relying on it significantly so given RW. SAfe with ambulation with RW  Stairs            Wheelchair Mobility    Modified Rankin (Stroke Patients Only)       Balance Overall balance assessment: Mild deficits observed, not formally tested                                           Pertinent Vitals/Pain Pain Assessment: Faces Faces Pain Scale: Hurts even more Pain Location: incision Pain Descriptors / Indicators: Discomfort;Grimacing;Guarding Pain Intervention(s): Limited activity within patient's tolerance;Monitored during session    Home Living Family/patient expects to be discharged to:: Private residence Living Arrangements: Alone Available Help at Discharge: Family;Available PRN/intermittently;Friend(s) Type of Home: House Home Access: Level entry     Home Layout: One level Home Equipment: None      Prior Function Level of Independence: Independent         Comments: drives. family lives nearby but cannot stay overnight with her     Hand Dominance        Extremity/Trunk Assessment   Upper Extremity Assessment Upper Extremity Assessment: Overall  WFL for tasks assessed    Lower Extremity Assessment Lower Extremity Assessment: LLE deficits/detail LLE Deficits / Details: mild weakness from previous back sx LLE Sensation: decreased light touch LLE Coordination: WNL    Cervical / Trunk Assessment Cervical / Trunk Assessment: Normal  Communication   Communication: No difficulties  Cognition  Arousal/Alertness: Awake/alert Behavior During Therapy: WFL for tasks assessed/performed Overall Cognitive Status: Within Functional Limits for tasks assessed                                        General Comments General comments (skin integrity, edema, etc.): pt has vertigo since fall with hit to head several years ago. Discussed outpt PT eval for BPPV after recovery from back sx. Pt also relays R shoulder discomfort and decreased ability to elevate above 90 deg. Recommend outpt ortho consult for RTC dysfucntion.     Exercises     Assessment/Plan    PT Assessment Patient needs continued PT services  PT Problem List Decreased activity tolerance;Decreased balance;Decreased mobility;Decreased knowledge of use of DME;Decreased knowledge of precautions;Pain       PT Treatment Interventions DME instruction;Gait training;Functional mobility training;Therapeutic activities;Therapeutic exercise;Balance training;Patient/family education    PT Goals (Current goals can be found in the Care Plan section)  Acute Rehab PT Goals Patient Stated Goal: return home PT Goal Formulation: With patient Time For Goal Achievement: 11/28/19 Potential to Achieve Goals: Good    Frequency Min 5X/week   Barriers to discharge Decreased caregiver support pt reports a caregiver is going to come stay with her first few nights    [name]-evaluation               AM-PAC PT "6 Clicks" Mobility  Outcome Measure Help needed turning from your back to your side while in a flat bed without using bedrails?: A Little Help needed moving from lying on your back to sitting on the side of a flat bed without using bedrails?: A Little Help needed moving to and from a bed to a chair (including a wheelchair)?: A Little Help needed standing up from a chair using your arms (e.g., wheelchair or bedside chair)?: A Little Help needed to walk in hospital room?: A Little Help needed climbing 3-5 steps with a  railing? : A Little 6 Click Score: 18    End of Session Equipment Utilized During Treatment: Gait belt;Back brace Activity Tolerance: Patient tolerated treatment well Patient left: in bed;with call bell/phone within reach;with bed alarm set Nurse Communication: Mobility status PT Visit Diagnosis: Difficulty in walking, not elsewhere classified (R26.2);Pain Pain - part of body: (back)    Time: 8527-7824 PT Time Calculation (min) (ACUTE ONLY): 30 min   Charges:   PT Evaluation $PT Eval Moderate Complexity: 1 Mod PT Treatments $Gait Training: 8-22 mins        [name], PT  Acute Rehab Services  Pager [phone] Office [phone]   Lawana Chambers Chioke Noxon 11/21/2019, 4:12 PM

## 2019-11-21 NOTE — Interval H&P Note (Signed)
History and Physical Interval Note:  11/21/2019 8:04 AM  [name]  has presented today for surgery, with the diagnosis of Lumbar stenosis with neurogenic claudication.  The various methods of treatment have been discussed with the patient and family. After consideration of risks, benefits and other options for treatment, the patient has consented to  Procedure(s) with comments: Lumbar Five Sacral One Redo decompression/fusion (N/A) - Lumbar Five Sacral One Redo decompression/fusion as a surgical intervention.  The patient's history has been reviewed, patient examined, no change in status, stable for surgery.  I have reviewed the patient's chart and labs.  Questions were answered to the patient's satisfaction.     Dorian Heckle

## 2019-11-21 NOTE — Progress Notes (Signed)
Awake, alert, conversant.  MAEW and doing well.  

## 2019-11-21 NOTE — Op Note (Signed)
11/21/2019  11:41 AM  PATIENT:  [name]  79 y.o. female  PRE-OPERATIVE DIAGNOSIS:  Lumbar stenosis with neurogenic claudication L 5 S 1 level, lumbago, recurrent disc herniation, radiculopathy  POST-OPERATIVE DIAGNOSIS:  Lumbar stenosis with neurogenic claudication L 5 S 1 level, lumbago, recurrent disc herniation, radiculopathy   PROCEDURE:  Procedure(s) with comments: Lumbar Five Sacral One Redo decompression/fusion (N/A) - Lumbar Five Sacral One Redo decompression/fusion with redo discectomy, facetectomy, pedicle screw fixation and posterolateral arthrodesis with autograft  SURGEON:  Surgeon(s) and Role:    * Jolana Runkles, MD - Primary  PHYSICIAN ASSISTANT:   ASSISTANTS: Poteat, RN   ANESTHESIA:   general  EBL:  150 mL   BLOOD ADMINISTERED:none  DRAINS: none   LOCAL MEDICATIONS USED:  MARCAINE    and LIDOCAINE   SPECIMEN:  No Specimen  DISPOSITION OF SPECIMEN:  N/A  COUNTS:  YES  TOURNIQUET:  * No tourniquets in log *   DICTATION: Patient is 79-year-old woman with recurrent disc herniation, spondylosis, lumbar stenosis L 5 S 1 left. She has a severe left S 1 radiculopathy. It was elected to take her to surgery for decompression and fusion at this level.   Procedure: Patient was placed in a prone position on the Jackson table after smooth and uncomplicated induction of general endotracheal anesthesia. Her low back was prepped and draped in usual sterile fashion with betadine scrub and DuraPrep. Area of incision was infiltrated with local lidocaine. Incision was made to the lumbodorsal fascia was incised and exposure was performed of the L 5 through  S 1 spinous processes laminae facet joint and transverse processes. Intraoperative x-ray was obtained which confirmed correct orientation. A total left hemi- laminectomy of L5 was performed with disarticulation of the facet joints at this level and thorough decompression was performed of both L 5 and S 1 nerve roots  along with the common dural tube. This decompression was more involved than would be typical of that performed for PLIF alone and included painstaking dissection of adherent ligament compressing the thecal sac and wide decompression of all neural elements. A thorough discectomy was  performed on the left with removal of osteophytic overgrowth and decompression of all neural elements.  Because of the patient's advanced age, degree of degeneration and poor bone quality, I elected to not place an interbody prosthesis at this level.   The posterolateral region was extensively decorticated and pedicle probes were placed at L 5 and S 1 bilaterally. Intraoperative fluoroscopy confirmed correct orientationin the AP and lateral plane. 45 x 6.5 mm pedicle screws were placed at L5 bilaterally and 40 x 6.5 mm screws placed at S 1 bilaterally final x-rays demonstrated well-positioned interbody grafts and pedicle screw fixation. A 45 mm lordotic rod was placed on the right and a 45 mm rod was placed on the left locked down in situ and the posterolateral region was on the right was packed with the extra extra small BMP and 20 cc of autograft after the facet and laminar complex was decorticated.  The wound was irrigated. Fascia was closed with 1 Vicryl sutures skin edges were reapproximated 2 and 3-0 Vicryl sutures. Long-acting Marcaine was injected in the deep musculature.  The wound is dressed with Dermabond and an occlusive dressing.  The patient was extubated in the operating room and taken to recovery in stable satisfactory condition. She tolerated the operation well counts were correct at the end of the case.   PLAN OF CARE: Admit to inpatient     PATIENT DISPOSITION:  PACU - hemodynamically stable.   Delay start of Pharmacological VTE agent (>24hrs) due to surgical blood loss or risk of bleeding: yes

## 2019-11-21 NOTE — Brief Op Note (Signed)
11/21/2019  11:41 AM  PATIENT:  [name]  79 y.o. female  PRE-OPERATIVE DIAGNOSIS:  Lumbar stenosis with neurogenic claudication L 5 S 1 level, lumbago, recurrent disc herniation, radiculopathy  POST-OPERATIVE DIAGNOSIS:  Lumbar stenosis with neurogenic claudication L 5 S 1 level, lumbago, recurrent disc herniation, radiculopathy   PROCEDURE:  Procedure(s) with comments: Lumbar Five Sacral One Redo decompression/fusion (N/A) - Lumbar Five Sacral One Redo decompression/fusion with redo discectomy, facetectomy, pedicle screw fixation and posterolateral arthrodesis with autograft  SURGEON:  Surgeon(s) and Role:    Maeola Harman, MD - Primary  PHYSICIAN ASSISTANT:   ASSISTANTS: Poteat, RN   ANESTHESIA:   general  EBL:  150 mL   BLOOD ADMINISTERED:none  DRAINS: none   LOCAL MEDICATIONS USED:  MARCAINE    and LIDOCAINE   SPECIMEN:  No Specimen  DISPOSITION OF SPECIMEN:  N/A  COUNTS:  YES  TOURNIQUET:  * No tourniquets in log *   DICTATION: Patient is 79 year old woman with recurrent disc herniation, spondylosis, lumbar stenosis L 5 S 1 left. She has a severe left S 1 radiculopathy. It was elected to take her to surgery for decompression and fusion at this level.   Procedure: Patient was placed in a prone position on the [place] table after smooth and uncomplicated induction of general endotracheal anesthesia. Her low back was prepped and draped in usual sterile fashion with betadine scrub and DuraPrep. Area of incision was infiltrated with local lidocaine. Incision was made to the lumbodorsal fascia was incised and exposure was performed of the L 5 through  S 1 spinous processes laminae facet joint and transverse processes. Intraoperative x-ray was obtained which confirmed correct orientation. A total left hemi- laminectomy of L5 was performed with disarticulation of the facet joints at this level and thorough decompression was performed of both L 5 and S 1 nerve roots  along with the common dural tube. This decompression was more involved than would be typical of that performed for PLIF alone and included painstaking dissection of adherent ligament compressing the thecal sac and wide decompression of all neural elements. A thorough discectomy was  performed on the left with removal of osteophytic overgrowth and decompression of all neural elements.  Because of the patient's advanced age, degree of degeneration and poor bone quality, I elected to not place an interbody prosthesis at this level.   The posterolateral region was extensively decorticated and pedicle probes were placed at L 5 and S 1 bilaterally. Intraoperative fluoroscopy confirmed correct orientationin the AP and lateral plane. 45 x 6.5 mm pedicle screws were placed at L5 bilaterally and 40 x 6.5 mm screws placed at S 1 bilaterally final x-rays demonstrated well-positioned interbody grafts and pedicle screw fixation. A 45 mm lordotic rod was placed on the right and a 45 mm rod was placed on the left locked down in situ and the posterolateral region was on the right was packed with the extra extra small BMP and 20 cc of autograft after the facet and laminar complex was decorticated.  The wound was irrigated. Fascia was closed with 1 Vicryl sutures skin edges were reapproximated 2 and 3-0 Vicryl sutures. Long-acting Marcaine was injected in the deep musculature.  The wound is dressed with Dermabond and an occlusive dressing.  The patient was extubated in the operating room and taken to recovery in stable satisfactory condition. She tolerated the operation well counts were correct at the end of the case.   PLAN OF CARE: Admit to inpatient  PATIENT DISPOSITION:  PACU - hemodynamically stable.   Delay start of Pharmacological VTE agent (>24hrs) due to surgical blood loss or risk of bleeding: yes

## 2019-11-21 NOTE — Transfer of Care (Signed)
Immediate Anesthesia Transfer of Care Note  Patient: GENEVER HENTGES  Procedure(s) Performed: Lumbar Five Sacral One Redo decompression/fusion (N/A Back)  Patient Location: PACU  Anesthesia Type:General  Level of Consciousness: drowsy and patient cooperative  Airway & Oxygen Therapy: Patient Spontanous Breathing and Patient connected to face mask oxygen  Post-op Assessment: Report given to RN and Post -op Vital signs reviewed and stable  Post vital signs: Reviewed and stable  Last Vitals:  Vitals Value Taken Time  BP 152/77 11/21/19 1134  Temp    Pulse 64 11/21/19 1136  Resp 11 11/21/19 1136  SpO2 100 % 11/21/19 1136  Vitals shown include unvalidated device data.  Last Pain:  Vitals:   11/21/19 0751  TempSrc:   PainSc: 0-No pain      Patients Stated Pain Goal: 3 (11/21/19 0751)  Complications: No apparent anesthesia complications

## 2019-11-21 NOTE — H&P (Signed)
Patient ID:   [id number] Patient: [name]  Date of Birth: Nov 15, 1940 Visit Type: Office Visit   Date: 10/11/2019 02:00 PM Provider: Marchia Meiers. Vertell Limber MD   This 79 year old female presents for MRI Review.  HISTORY OF PRESENT ILLNESS:  1.  MRI Review  Patient returns to review her MRI   the patient continues to complain of miserable left leg pain.  She grades her pain level at 8/10 in severity.  I reviewed her MRI of her lumbar spine which shows persistent significant degenerative arthritic changes with osteophyte and disc protrusion at the L5-S1 level on the left with left S1 nerve root compression.    I explained that this could be treated surgically but that this time we would need to remove the facet joint to be able to fully remove the arthritis at the L5-S1 level on the left.  I also explained that her MRI suggests auto fusion with significant disc height loss at the L5-S1 level and that likely would not put in interbody graft at this level based on my review of her current imaging.  However if at the time of surgery we do find that there is a disc space and there is some movement between these vertebrae, then we may proceed with TLIF grafting as well as posterior fusion with pedicle screw fixation and posterolateral arthrodesis   the patient wants to go ahead with surgery given the persistent and unrelenting nature of her pain.         Medical/Surgical/Interim History Reviewed, no change.  Last detailed document date:03/09/2016.     PAST MEDICAL HISTORY, SURGICAL HISTORY, FAMILY HISTORY, SOCIAL HISTORY AND REVIEW OF SYSTEMS I have reviewed the patient's past medical, surgical, family and social history as well as the comprehensive review of systems as included on the [facility] history form dated 09/06/2019, which I have signed.  Family History:  Reviewed, no changes.  Last detailed document date:03/06/2014.   Social History: Reviewed,  no changes. Last detailed document date: 03/06/2014.    MEDICATIONS: (added, continued or stopped this visit) Started Medication Directions Instruction Stopped   aspirin 325 mg  BUCCAL      carvedilol 25 mg tablet take 1 tablet by oral route 2 times every day with food     fiber      glimepiride 4 mg tablet take 1 tablet by oral route  every day     hydrocodone      metformin 1,000 mg tablet take 1 tablet by oral route  every day with morning and evening meals     pantoprazole 40 mg tablet,delayed release take 1 tablet by oral route  every day     pravastatin 40 mg tablet        ALLERGIES: Ingredient Reaction Medication Name Comment  ATORVASTATIN CALCIUM  Lipitor    Reviewed, no changes.    PHYSICAL EXAM:   Vitals Date Temp F BP Pulse Ht In Wt Lb BMI BSA Pain Score  10/11/2019 96 155/83 62 69 147.4 21.77  8/10      IMPRESSION:     The patient wishes to go ahead with surgery for redo decompression L5-S1 with fusion for persistent left S1 radiculopathy  PLAN:   patient education was performed and she was fitted for a brace.  Surgery has been scheduled at [facility] on May 11th.  Risks and benefits were discussed in detail with the patient and she wishes to proceed with surgery  Orders: Diagnostic  Procedures: Assessment Procedure  M54.16 Lumbar Spine- AP/Lat  Instruction(s)/Education: Assessment Instruction  I10 Lifestyle education  Miscellaneous: Assessment   M48.062 LSO Brace   Completed Orders (this encounter) Order Details Reason Side Interpretation Result Initial Treatment Date Region  Lifestyle education Patient will follow up with Primary care physician.         Assessment/Plan   # Detail Type Description   1. Assessment Displacement of lumbosacral intervertebral disc (M51.27).       2. Assessment DDD (degenerative disc disease), lumbosacral (M51.37).       3. Assessment Radiculopathy, lumbosacral region (M54.17).       4. Assessment Low back  pain, unspecified back pain laterality, with sciatica presence unspecified (M54.5).       5. Assessment Lumbar stenosis with neurogenic claudication (M48.062).   Plan Orders LSO Brace.       6. Assessment Lumbar radiculopathy (M54.16).       7. Assessment Essential (primary) hypertension (I10).         Pain Management Plan Pain Scale: 8/10. Method: Numeric Pain Intensity Scale. Location: back. Onset: 11/04/2013. Duration: varies. Quality: discomforting. Pain management follow-up plan of care: Patient will continue medication management..              Provider:  Danae Orleans. Venetia Maxon MD  10/14/2019 12:35 PM    Dictation edited by: Danae Orleans. Venetia Maxon    CC Providers: Liberty Handy [address] [facility] [place],  [place]  [address]-   Maeola Harman MD  [address] [place], [place] [address]               Electronically signed by Danae Orleans. Venetia Maxon MD on 10/14/2019 12:35 PM

## 2019-11-21 NOTE — Anesthesia Procedure Notes (Addendum)
Procedure Name: Intubation Date/Time: 11/21/2019 9:02 AM Performed by: Modena Morrow, CRNA Pre-anesthesia Checklist: Patient identified, Emergency Drugs available, Suction available and Patient being monitored Patient Re-evaluated:Patient Re-evaluated prior to induction Oxygen Delivery Method: Circle system utilized Preoxygenation: Pre-oxygenation with 100% oxygen Induction Type: IV induction Ventilation: Mask ventilation without difficulty Laryngoscope Size: Miller and 3 Grade View: Grade I Tube type: Oral Tube size: 7.0 mm Number of attempts: 1 Airway Equipment and Method: Stylet and Oral airway Placement Confirmation: ETT inserted through vocal cords under direct vision,  positive ETCO2 and breath sounds checked- equal and bilateral Secured at: 22 cm Tube secured with: Tape Dental Injury: Teeth and Oropharynx as per pre-operative assessment

## 2019-11-22 LAB — GLUCOSE, CAPILLARY
Glucose-Capillary: 127 mg/dL — ABNORMAL HIGH (ref 70–99)
Glucose-Capillary: 177 mg/dL — ABNORMAL HIGH (ref 70–99)

## 2019-11-22 MED ORDER — METHOCARBAMOL 500 MG PO TABS: 500.0000 mg | ORAL_TABLET | Freq: Four times a day (QID) | ORAL | 0 refills | Status: DC | PRN

## 2019-11-22 MED ORDER — HYDROCODONE-ACETAMINOPHEN 5-325 MG PO TABS: 1.0000 | ORAL_TABLET | Freq: Four times a day (QID) | ORAL | 0 refills | Status: DC | PRN

## 2019-11-22 NOTE — Progress Notes (Signed)
Discharged instructions/education/AVS given to patient and verbalized understanding, all questions were answered appropriately and patient was satisfied. Ambulating well with walker. Voiding and emptying bladder well. Dressing clean, dry and intact. No redness, no drainage no swelling noted on incision site. Awaiting for transport.

## 2019-11-22 NOTE — Progress Notes (Signed)
Subjective: Patient reports doing well.  Objective: Vital signs in last 24 hours: Temp:  [97 F (36.1 C)-98.5 F (36.9 C)] 98.5 F (36.9 C) (05/12 0745) Pulse Rate:  [54-69] 61 (05/12 0745) Resp:  [10-22] 16 (05/12 0745) BP: (102-179)/(55-84) 102/55 (05/12 0745) SpO2:  [97 %-100 %] 98 % (05/12 0745)  Intake/Output from previous day: 05/11 0701 - 05/12 0700 In: 1800 [I.V.:1600; IV Piggyback:200] Out: 2575 [Urine:2425; Blood:150] Intake/Output this shift: No intake/output data recorded.  Physical Exam: Dressing CDI.  Full strength both LE's PF/DF.  Numbness improved.  Lab Results: Recent Labs    11/21/19 0752  HGB 11.6*  HCT 34.0*   BMET Recent Labs    11/21/19 0752  NA 131*  K 4.7  CL 103  GLUCOSE 146*  BUN 15  CREATININE 0.90    Studies/Results: DG Lumbar Spine 2-3 Views  Result Date: 11/21/2019 CLINICAL DATA:  Posterior fusion EXAM: LUMBAR SPINE - 2-3 VIEW; DG C-ARM 1-60 MIN FLUOROSCOPY TIME:  30 seconds COMPARISON:  None. FINDINGS: Frontal and lateral intraoperative views demonstrate bilateral pedicle screw placement at L4 and L5. IMPRESSION: Fluoroscopic guidance for lumbar fusion. Electronically Signed   By: Guadlupe Spanish M.D.   On: 11/21/2019 14:10   DG C-Arm 1-60 Min  Result Date: 11/21/2019 CLINICAL DATA:  Posterior fusion EXAM: LUMBAR SPINE - 2-3 VIEW; DG C-ARM 1-60 MIN FLUOROSCOPY TIME:  30 seconds COMPARISON:  None. FINDINGS: Frontal and lateral intraoperative views demonstrate bilateral pedicle screw placement at L4 and L5. IMPRESSION: Fluoroscopic guidance for lumbar fusion. Electronically Signed   By: Guadlupe Spanish M.D.   On: 11/21/2019 14:10    Assessment/Plan: Patient is doing well.  Minimal back pain.  LE strength good.  Radicular pain improved.    LOS: 1 day    Dorian Heckle, MD 11/22/2019, 8:09 AM

## 2019-11-22 NOTE — Progress Notes (Addendum)
Physical Therapy Treatment Patient Details Name: [name] MRN: [id number] DOB: 1941-04-19 Today's Date: 11/22/2019    History of Present Illness Pt is a 79 y/o female s/p PLIF L5-S1. PMH including but not limited to TIA, CVA, MI, HTN, DM, CAD.    PT Comments    Pt making steady progress with functional mobility. She continues to require occasional cueing to adhere to back precautions and for safety with use of AD. PT provided pt education re: back precautions, car transfers and a generalized walking program for pt to initiate upon d/c home. Pt would continue to benefit from skilled physical therapy services at this time while admitted and after d/c to address the below listed limitations in order to improve overall safety and independence with functional mobility.    Follow Up Recommendations  Home health PT     Equipment Recommendations  Rolling walker with 5" wheels    Recommendations for Other Services       Precautions / Restrictions Precautions Precautions: Back Precaution Comments: reviewed precautions Required Braces or Orthoses: Spinal Brace Spinal Brace: Lumbar corset;Applied in sitting position Restrictions Weight Bearing Restrictions: No    Mobility  Bed Mobility Overal bed mobility: Needs Assistance Bed Mobility: Rolling;Sidelying to Sit;Sit to Sidelying Rolling: Supervision Sidelying to sit: Supervision     Sit to sidelying: Supervision General bed mobility comments: good demonstration of log roll technique, supervision for safety  Transfers Overall transfer level: Needs assistance Equipment used: Rolling walker (2 wheeled) Transfers: Sit to/from Stand Sit to Stand: Min guard         General transfer comment: for safety  Ambulation/Gait Ambulation/Gait assistance: Min guard Gait Distance (Feet): 300 Feet Assistive device: Rolling walker (2 wheeled) Gait Pattern/deviations: Step-through pattern;Decreased stride length Gait velocity:  decreased   General Gait Details: pt with mild instability but no overt LOB or need for physical assistance; occasional cueing to maintain proximity to [employer]    Modified Rankin (Stroke Patients Only)       Balance Overall balance assessment: Needs assistance Sitting-balance support: Feet supported Sitting balance-Leahy Scale: Good     Standing balance support: During functional activity;No upper extremity supported Standing balance-Leahy Scale: Fair                              Cognition Arousal/Alertness: Awake/alert Behavior During Therapy: WFL for tasks assessed/performed Overall Cognitive Status: Within Functional Limits for tasks assessed                                        Exercises      General Comments        Pertinent Vitals/Pain Pain Assessment: 0-10 Pain Score: 2  Pain Location: lower back and R hip Pain Descriptors / Indicators: Discomfort;Grimacing;Guarding Pain Intervention(s): Monitored during session;Repositioned;Patient requesting pain meds-RN notified    Home Living                      Prior Function            PT Goals (current goals can now be found in the care plan section) Acute Rehab PT Goals PT Goal Formulation: With patient Time For Goal Achievement: 11/28/19 Potential to Achieve Goals: Good Progress towards PT goals: Progressing toward  goals    Frequency    Min 5X/week      PT Plan Current plan remains appropriate    Co-evaluation              AM-PAC PT "6 Clicks" Mobility   Outcome Measure  Help needed turning from your back to your side while in a flat bed without using bedrails?: None Help needed moving from lying on your back to sitting on the side of a flat bed without using bedrails?: None Help needed moving to and from a bed to a chair (including a wheelchair)?: None Help needed standing up from a chair using your arms  (e.g., wheelchair or bedside chair)?: None Help needed to walk in hospital room?: A Little Help needed climbing 3-5 steps with a railing? : A Little 6 Click Score: 22    End of Session Equipment Utilized During Treatment: Back brace Activity Tolerance: Patient tolerated treatment well Patient left: in bed;with call bell/phone within reach Nurse Communication: Mobility status PT Visit Diagnosis: Difficulty in walking, not elsewhere classified (R26.2);Pain Pain - part of body: (back)     Time: 9417-4081 PT Time Calculation (min) (ACUTE ONLY): 26 min  Charges:  $Gait Training: 8-22 mins $Therapeutic Activity: 8-22 mins                     Anastasio Champion, DPT  Acute Rehabilitation Services Pager [phone] Office [place] 11/22/2019, 10:18 AM

## 2019-11-22 NOTE — Discharge Summary (Signed)
Physician Discharge Summary  Patient ID: RISE TRAEGER MRN: [id number] DOB/AGE: 07/28/1940 79 y.o.  Admit date: 11/21/2019 Discharge date: 11/22/2019  Admission Diagnoses:Lumbar spinal stenosis with recurrent disc herniation and radiculopathy with lumbago L 5 S 1 level  Discharge Diagnoses: Lumbar spinal stenosis with recurrent disc herniation and radiculopathy with lumbago L 5 S 1 level  Active Problems:   Spinal stenosis of lumbar region with radiculopathy   Discharged Condition: good  Hospital Course: Patient underwent decompression and fusion L 5 S 1 level.  She did well post-operatively with improvement in leg pain and weakness.  Mobilizing well on POD1.  Consults: None  Significant Diagnostic Studies: None  Treatments: surgery: Patient underwent decompression and fusion L 5 S 1 level.  Discharge Exam: Blood pressure (!) 102/55, pulse 61, temperature 98.5 F (36.9 C), temperature source Oral, resp. rate 16, height 5\' 9"  (1.753 m), weight 67.1 kg, SpO2 98 %. Neurologic: Alert and oriented X 3, normal strength and tone. Normal symmetric reflexes. Normal coordination and gait Wound:CDI  Disposition: Home  Discharge Instructions    Diet - low sodium heart healthy   Complete by: As directed    Face-to-face encounter (required for Medicare/Medicaid patients)   Complete by: As directed    I Peggyann Shoals certify that this patient is under my care and that I, or a nurse practitioner or physician's assistant working with me, had a face-to-face encounter that meets the physician face-to-face encounter requirements with this patient on 11/22/2019. The encounter with the patient was in whole, or in part for the following medical condition(s) which is the primary reason for home health care (List medical condition): lumbar spinal stenosis   The encounter with the patient was in whole, or in part, for the following medical condition, which is the primary reason for home health care:  yes   I certify that, based on my findings, the following services are medically necessary home health services:  Nursing Physical therapy     Reason for Medically Necessary Home Health Services:  Skilled Nursing- Assessment and Observation of Wound Status Therapy- Instruction on use of Assistive Device for Ambulation on all Surfaces     My clinical findings support the need for the above services: Unable to leave home safely without assistance and/or assistive device   Further, I certify that my clinical findings support that this patient is homebound due to: Pain interferes with ambulation/mobility   Home Health   Complete by: As directed    To provide the following care/treatments:  PT Home Health Aide RN     Increase activity slowly   Complete by: As directed      Allergies as of 11/22/2019      Reactions   [place]etta 5 Mcg Pen [exenatide] Nausea Only   Codeine Sulfate Other (See Comments)   Metoprolol Tartrate Cough   Lipitor [atorvastatin] Palpitations   Felt like heart attack      Medication List    STOP taking these medications   clopidogrel 75 MG tablet Commonly known as: PLAVIX   ibuprofen 200 MG tablet Commonly known as: ADVIL     TAKE these medications   aspirin EC 81 MG tablet Take 81 mg by mouth at bedtime.   CALCIUM PO Take 1 tablet by mouth daily.   carvedilol 25 MG tablet Commonly known as: COREG Take 25 mg by mouth 2 (two) times daily with a meal.   Cinnamon 500 MG capsule Take 500 mg by mouth daily.  diphenhydrAMINE 25 MG tablet Commonly known as: BENADRYL Take 25 mg by mouth 2 (two) times daily as needed for allergies.   glimepiride 4 MG tablet Commonly known as: AMARYL Take 4 mg 2 (two) times daily by mouth.   HYDROcodone-acetaminophen 5-325 MG tablet Commonly known as: NORCO/VICODIN Take 1-2 tablets by mouth every 6 (six) hours as needed for moderate pain. What changed:   how much to take  when to take this  reasons to take this    ipratropium 0.03 % nasal spray Commonly known as: ATROVENT Place 2 sprays into both nostrils daily as needed for allergies.   metFORMIN 500 MG tablet Commonly known as: GLUCOPHAGE Take 500-1,000 mg by mouth See admin instructions. Take 1 tablet (500 mg) by mouth in the morning & take 2 tablets (1000 mg) by mouth at night.   methocarbamol 500 MG tablet Commonly known as: ROBAXIN Take 1 tablet (500 mg total) by mouth every 6 (six) hours as needed for muscle spasms.   pantoprazole 40 MG tablet Commonly known as: PROTONIX Take 1 tablet (40 mg total) by mouth daily. Take 1 tab twice a day. What changed:   when to take this  additional instructions   pravastatin 40 MG tablet Commonly known as: PRAVACHOL Take 40 mg by mouth at bedtime.   spironolactone 25 MG tablet Commonly known as: ALDACTONE Take 25 mg by mouth 2 (two) times daily.   Vitamin D-1000 Max St 25 MCG (1000 UT) tablet Generic drug: Cholecalciferol Take 1,000 Units by mouth daily.        Signed: Dorian Heckle, MD 11/22/2019, 8:13 AM

## 2019-11-22 NOTE — Evaluation (Signed)
Occupational Therapy Evaluation Patient Details Name: [name] MRN: [id number] DOB: [birth year] Today's Date: 11/22/2019    History of Present Illness Pt is a 79 y/o female s/p PLIF L5-S1. PMH including but not limited to TIA, CVA, MI, HTN, DM, CAD.   Clinical Impression   Pt PTA: pt living alone and independent; pt with 2 sons who live close by. Pt currently supervisionA for mobility with RW. Pt supervisionA for ADL tasks.Pt performing LB ADL with figure 4 technique with no cues for proper technique. Pt performing dressing routine with supervisionA only. Pt plans to wear dresses and loose clothing. Pt education on AE available.  Back handout provided and reviewed ADL in detail. Pt educated on: clothing between brace, never sleep in brace, set an alarm at night for medication, avoid sitting for long periods of time, correct bed positioning for sleeping, correct sequence for bed mobility, avoiding lifting more than 5 pounds and never wash directly over incision. All education is complete and patient indicates understanding. Pt would benefit from continued OT skilled services for post acute care setting. OT signing off a this time.        Follow Up Recommendations  Home health OT    Equipment Recommendations  None recommended by OT    Recommendations for Other Services       Precautions / Restrictions Precautions Precautions: Back Precaution Booklet Issued: Yes (comment) Precaution Comments: reviewed precautions Required Braces or Orthoses: Spinal Brace Spinal Brace: Lumbar corset;Applied in sitting position Restrictions Weight Bearing Restrictions: No      Mobility Bed Mobility Overal bed mobility: Needs Assistance Bed Mobility: Rolling;Sidelying to Sit;Sit to Sidelying Rolling: Supervision Sidelying to sit: Supervision     Sit to sidelying: Supervision General bed mobility comments: log roll technique, no use of rail  Transfers Overall transfer level: Needs  assistance Equipment used: Rolling walker (2 wheeled) Transfers: Sit to/from Stand Sit to Stand: Min guard         General transfer comment: for safety    Balance Overall balance assessment: Needs assistance Sitting-balance support: Feet supported Sitting balance-Leahy Scale: Good     Standing balance support: During functional activity;No upper extremity supported Standing balance-Leahy Scale: Fair                             ADL either performed or assessed with clinical judgement   ADL Overall ADL's : Needs assistance/impaired Eating/Feeding: Modified independent   Grooming: Modified independent;Sitting   Upper Body Bathing: Supervision/ safety;Standing   Lower Body Bathing: Supervison/ safety;Sitting/lateral leans;Sit to/from stand   Upper Body Dressing : Supervision/safety;Sitting   Lower Body Dressing: Supervision/safety;Sitting/lateral leans;Sit to/from stand   Toilet Transfer: Supervision/safety;RW   Toileting- Water quality scientist and Hygiene: Supervision/safety;Sitting/lateral lean;Sit to/from stand       Functional mobility during ADLs: Supervision/safety;Rolling walker;Cueing for safety General ADL Comments: Pt performing LB ADL with figure 4 technique with no cues for proper technique. Pt performing dressing routine with supervisionA only. Pt plans to wear dresses and loose clothing. Pt education on AE available.      Vision Baseline Vision/History: No visual deficits Patient Visual Report: No change from baseline Vision Assessment?: No apparent visual deficits     Perception     Praxis      Pertinent Vitals/Pain Pain Assessment: 0-10 Pain Score: 3  Pain Location: lower back and R hip Pain Descriptors / Indicators: Discomfort;Grimacing;Guarding Pain Intervention(s): Monitored during session;Premedicated before session;Repositioned  Hand Dominance Right   Extremity/Trunk Assessment Upper Extremity Assessment Upper Extremity  Assessment: Overall WFL for tasks assessed   Lower Extremity Assessment Lower Extremity Assessment: Defer to PT evaluation;Generalized weakness LLE Deficits / Details: mild weakness from previous back sx   Cervical / Trunk Assessment Cervical / Trunk Assessment: Other exceptions Cervical / Trunk Exceptions: s/p back sx   Communication Communication Communication: No difficulties   Cognition Arousal/Alertness: Awake/alert Behavior During Therapy: WFL for tasks assessed/performed                                       General Comments  Pt reports vertigo issues since a fall and plans to follow-up with OP PT for it. Pt has family nearby to assist.     Exercises     Shoulder Instructions      Home Living Family/patient expects to be discharged to:: Private residence Living Arrangements: Alone Available Help at Discharge: Family;Available PRN/intermittently;Friend(s) Type of Home: House Home Access: Level entry     Home Layout: One level     Bathroom Shower/Tub: Producer, television/film/video: Handicapped height     Home Equipment: None          Prior Functioning/Environment Level of Independence: Independent        Comments: drives. family lives nearby but cannot stay overnight with her        OT Problem List: Decreased strength;Decreased activity tolerance;Impaired balance (sitting and/or standing);Decreased cognition;Decreased safety awareness      OT Treatment/Interventions:      OT Goals(Current goals can be found in the care plan section) Acute Rehab OT Goals Patient Stated Goal: return home OT Goal Formulation: With patient  OT Frequency:     Barriers to D/C:            Co-evaluation              AM-PAC OT "6 Clicks" Daily Activity     Outcome Measure Help from another person eating meals?: None Help from another person taking care of personal grooming?: None Help from another person toileting, which includes using  toliet, bedpan, or urinal?: None Help from another person bathing (including washing, rinsing, drying)?: A Little Help from another person to put on and taking off regular upper body clothing?: None Help from another person to put on and taking off regular lower body clothing?: A Little 6 Click Score: 22   End of Session Equipment Utilized During Treatment: Gait belt;Rolling walker;Back brace Nurse Communication: Mobility status  Activity Tolerance: Patient tolerated treatment well Patient left: in bed;with call bell/phone within reach  OT Visit Diagnosis: Unsteadiness on feet (R26.81);Muscle weakness (generalized) (M62.81);Pain Pain - part of body: (back)                Time: 0973-5329 OT Time Calculation (min): 25 min Charges:  OT General Charges $OT Visit: 1 Visit OT Evaluation $OT Eval Moderate Complexity: 1 Mod OT Treatments $Self Care/Home Management : 8-22 mins  Flora Lipps, OTR/L Acute Rehabilitation Services Pager: [phone] Office: [phone]   Keilyn Nadal  C 11/22/2019, 2:30 PM

## 2019-11-22 NOTE — TOC Transition Note (Signed)
Transition of Care [facility]) - CM/SW Discharge Note   Patient Details  Name: [name] MRN: [id number] Date of Birth: 1941/05/09  [facility] CM/SW Contact:  Lawerance Sabal, RN Phone Number: 11/22/2019, 11:58 AM   Clinical Narrative:   [name] w patient at bedside. Discussed HH care. Explained that TOC unable to find RN but could establish HH PT and HHA. Patient stated that she was not interested in [facility] RN. Discussed provider rating, Referral made to [facility] and accepted.  RW provided by nursing staff. Patient states her sons call her 2-3 times a day, and will check in on her everyday after she gets home. They are providing transport home. No other CM needs identified.     Final next level of care: Home w Home Health Services Barriers to Discharge: No Barriers Identified   Patient Goals and CMS Choice Patient states their goals for this hospitalization and ongoing recovery are:: to go home CMS Medicare.gov Compare Post Acute Care list provided to:: Patient Choice offered to / list presented to : Patient  Discharge Placement                       Discharge Plan and Services                DME Arranged: Walker rolling DME Agency: [employer]     Representative spoke with at DME Agency: from unit storage HH Arranged: PT, Nurse's Aide HH Agency: [facility] Date [facility] Agency Contacted: 11/22/19 Time HH Agency Contacted: 1158 Representative spoke with at [facility] Agency: Kandee Keen  Social Determinants of Health (SDOH) Interventions     Readmission Risk Interventions No flowsheet data found.

## 2020-05-10 ENCOUNTER — Ambulatory Visit: Payer: Medicare Other | Admitting: Diagnostic Neuroimaging

## 2020-07-16 DIAGNOSIS — E785 Hyperlipidemia, unspecified: Secondary | ICD-10-CM | POA: Diagnosis not present

## 2020-07-16 DIAGNOSIS — I10 Essential (primary) hypertension: Secondary | ICD-10-CM | POA: Diagnosis not present

## 2020-07-16 DIAGNOSIS — R7989 Other specified abnormal findings of blood chemistry: Secondary | ICD-10-CM | POA: Diagnosis not present

## 2020-07-16 DIAGNOSIS — E119 Type 2 diabetes mellitus without complications: Secondary | ICD-10-CM | POA: Diagnosis not present

## 2020-07-18 DIAGNOSIS — Z0000 Encounter for general adult medical examination without abnormal findings: Secondary | ICD-10-CM | POA: Diagnosis not present

## 2020-07-18 DIAGNOSIS — K219 Gastro-esophageal reflux disease without esophagitis: Secondary | ICD-10-CM | POA: Diagnosis not present

## 2020-07-18 DIAGNOSIS — E785 Hyperlipidemia, unspecified: Secondary | ICD-10-CM | POA: Diagnosis not present

## 2020-07-18 DIAGNOSIS — I10 Essential (primary) hypertension: Secondary | ICD-10-CM | POA: Diagnosis not present

## 2020-07-18 DIAGNOSIS — E1159 Type 2 diabetes mellitus with other circulatory complications: Secondary | ICD-10-CM | POA: Diagnosis not present

## 2020-08-05 ENCOUNTER — Other Ambulatory Visit: Payer: Self-pay | Admitting: Orthopaedic Surgery

## 2020-08-05 DIAGNOSIS — Z01818 Encounter for other preprocedural examination: Secondary | ICD-10-CM

## 2020-08-08 DIAGNOSIS — Z85828 Personal history of other malignant neoplasm of skin: Secondary | ICD-10-CM | POA: Diagnosis not present

## 2020-08-08 DIAGNOSIS — D225 Melanocytic nevi of trunk: Secondary | ICD-10-CM | POA: Diagnosis not present

## 2020-08-08 DIAGNOSIS — L821 Other seborrheic keratosis: Secondary | ICD-10-CM | POA: Diagnosis not present

## 2020-08-08 DIAGNOSIS — L570 Actinic keratosis: Secondary | ICD-10-CM | POA: Diagnosis not present

## 2020-08-15 DIAGNOSIS — M545 Low back pain, unspecified: Secondary | ICD-10-CM | POA: Diagnosis not present

## 2020-08-15 DIAGNOSIS — R42 Dizziness and giddiness: Secondary | ICD-10-CM | POA: Diagnosis not present

## 2020-08-15 DIAGNOSIS — G459 Transient cerebral ischemic attack, unspecified: Secondary | ICD-10-CM | POA: Diagnosis not present

## 2020-08-15 DIAGNOSIS — M542 Cervicalgia: Secondary | ICD-10-CM | POA: Diagnosis not present

## 2020-08-26 NOTE — Patient Instructions (Addendum)
DUE TO COVID-19 ONLY ONE VISITOR IS ALLOWED TO COME WITH YOU AND STAY IN THE WAITING ROOM ONLY DURING PRE OP AND PROCEDURE DAY OF SURGERY. THE 1 VISITOR  MAY VISIT WITH YOU AFTER SURGERY IN YOUR PRIVATE ROOM DURING VISITING HOURS ONLY!  YOU NEED TO HAVE A COVID 19 TEST ON_2/18______ @__2 :25____, THIS TEST MUST BE DONE BEFORE SURGERY,  COVID TESTING SITE [address] [place] , IT IS ON THE RIGHT GOING OUT [address] APPROXIMATELY  2 MINUTES PAST [employer] ON THE RIGHT. ONCE YOUR COVID TEST IS COMPLETED,  PLEASE BEGIN THE QUARANTINE INSTRUCTIONS AS OUTLINED IN YOUR HANDOUT.                [name]    Your procedure is scheduled on: 09/03/20   Report to [facility] Main  Entrance   Report to Short stay at 5:30 AM     Call this number if you have problems the morning of surgery [phone]    BRUSH YOUR TEETH MORNING OF SURGERY AND RINSE YOUR MOUTH OUT, NO CHEWING GUM CANDY OR MINTS.   No food after midnight.    You may have clear liquid until 4:30 AM.    At 4:00 AM drink pre surgery drink.   Nothing by mouth after 4:30 AM.   Take these medicines the morning of surgery with A SIP OF WATER: Carvedilol, Pantoprazole        How to Manage Your Diabetes Before and After Surgery  Why is it important to control my blood sugar before and after surgery? . Improving blood sugar levels before and after surgery helps healing and can limit problems. . A way of improving blood sugar control is eating a healthy diet by: o  Eating less sugar and carbohydrates o  Increasing activity/exercise o  Talking with your doctor about reaching your blood sugar goals . High blood sugars (greater than 180 mg/dL) can raise your risk of infections and slow your recovery, so you will need to focus on controlling your diabetes during the weeks before surgery. . Make sure that the doctor who takes care of your diabetes knows about your planned surgery including  the date and location.  How do I manage my blood sugar before surgery? . Check your blood sugar at least 4 times a day, starting 2 days before surgery, to make sure that the level is not too high or low. o Check your blood sugar the morning of your surgery when you wake up and every 2 hours until you get to the Short Stay unit. . If your blood sugar is less than 70 mg/dL, you will need to treat for low blood sugar: o Do not take insulin. o Treat a low blood sugar (less than 70 mg/dL) with  cup of clear juice (cranberry or apple), 4 glucose tablets, OR glucose gel. o Recheck blood sugar in 15 minutes after treatment (to make sure it is greater than 70 mg/dL). If your blood sugar is not greater than 70 mg/dL on recheck, call [phone] for further instructions. . Report your blood sugar to the short stay nurse when you get to Short Stay.  . If you are admitted to the hospital after surgery: o Your blood sugar will be checked by the staff and you will probably be given insulin after surgery (instead of oral diabetes medicines) to make sure you have good blood sugar levels. o The goal for blood sugar control after surgery is  80-180 mg/dL.   WHAT DO I DO ABOUT MY DIABETES MEDICATION?  Marland Kitchen Do not take oral diabetes medicines (pills) the morning of surgery.                            You may not have any metal on your body including hair pins and              piercings  Do not wear jewelry, make-up, lotions, powders or perfumes, deodorant             Do not wear nail polish on your fingernails.  Do not shave  48 hours prior to surgery.             Do not bring valuables to the hospital. [facility] IS NOT             RESPONSIBLE   FOR VALUABLES.  Contacts, dentures or bridgework may not be worn into surgery.      Patients discharged the day of surgery will not be allowed to drive home.   IF YOU ARE HAVING SURGERY AND GOING HOME THE SAME DAY, YOU MUST HAVE AN ADULT TO DRIVE YOU HOME AND  BE WITH YOU FOR 24 HOURS.  YOU MAY GO HOME BY TAXI OR UBER OR ORTHERWISE, BUT AN ADULT MUST ACCOMPANY YOU HOME AND STAY WITH YOU FOR 24 HOURS.  Name and phone number of your driver:  Special Instructions: N/A              Please read over the following fact sheets you were given: _____________________________________________________________________             [facility] - Preparing for Surgery Before surgery, you can play an important role.  Because skin is not sterile, your skin needs to be as free of germs as possible.  You can reduce the number of germs on your skin by washing with CHG (chlorahexidine gluconate) soap before surgery.  CHG is an antiseptic cleaner which kills germs and bonds with the skin to continue killing germs even after washing. Please DO NOT use if you have an allergy to CHG or antibacterial soaps.  If your skin becomes reddened/irritated stop using the CHG and inform your nurse when you arrive at Short Stay. Do not shave (including legs and underarms) for at least 48 hours prior to the first CHG shower.    Please follow these instructions carefully:  1.  Shower with CHG Soap the night before surgery and the  morning of Surgery.  2.  If you choose to wash your hair, wash your hair first as usual with your  normal  shampoo.  3.  After you shampoo, rinse your hair and body thoroughly to remove the  shampoo.                                        4.  Use CHG as you would any other liquid soap.  You can apply chg directly  to the skin and wash                       Gently with a scrungie or clean washcloth.  5.  Apply the CHG Soap to your body ONLY FROM THE NECK DOWN.   Do not use on face/ open  Wound or open sores. Avoid contact with eyes, ears mouth and genitals (private parts).                       Wash face,  Genitals (private parts) with your normal soap.             6.  Wash thoroughly, paying special attention to the area where your  surgery  will be performed.  7.  Thoroughly rinse your body with warm water from the neck down.  8.  DO NOT shower/wash with your normal soap after using and rinsing off  the CHG Soap.             9.  Pat yourself dry with a clean towel.            10.  Wear clean pajamas.            11.  Place clean sheets on your bed the night of your first shower and do not  sleep with pets. Day of Surgery : Do not apply any lotions/deodorants the morning of surgery.  Please wear clean clothes to the hospital/surgery center.  FAILURE TO FOLLOW THESE INSTRUCTIONS MAY RESULT IN THE CANCELLATION OF YOUR SURGERY PATIENT SIGNATURE_________________________________  NURSE SIGNATURE__________________________________  ________________________________________________________________________   Rogelia MireIncentive Spirometer  An incentive spirometer is a tool that can help keep your lungs clear and active. This tool measures how well you are filling your lungs with each breath. Taking long deep breaths may help reverse or decrease the chance of developing breathing (pulmonary) problems (especially infection) following:  A long period of time when you are unable to move or be active. BEFORE THE PROCEDURE   If the spirometer includes an indicator to show your best effort, your nurse or respiratory therapist will set it to a desired goal.  If possible, sit up straight or lean slightly forward. Try not to slouch.  Hold the incentive spirometer in an upright position. INSTRUCTIONS FOR USE  1. Sit on the edge of your bed if possible, or sit up as far as you can in bed or on a chair. 2. Hold the incentive spirometer in an upright position. 3. Breathe out normally. 4. Place the mouthpiece in your mouth and seal your lips tightly around it. 5. Breathe in slowly and as deeply as possible, raising the piston or the ball toward the top of the column. 6. Hold your breath for 3-5 seconds or for as long as possible. Allow the  piston or ball to fall to the bottom of the column. 7. Remove the mouthpiece from your mouth and breathe out normally. 8. Rest for a few seconds and repeat Steps 1 through 7 at least 10 times every 1-2 hours when you are awake. Take your time and take a few normal breaths between deep breaths. 9. The spirometer may include an indicator to show your best effort. Use the indicator as a goal to work toward during each repetition. 10. After each set of 10 deep breaths, practice coughing to be sure your lungs are clear. If you have an incision (the cut made at the time of surgery), support your incision when coughing by placing a pillow or rolled up towels firmly against it. Once you are able to get out of bed, walk around indoors and cough well. You may stop using the incentive spirometer when instructed by your caregiver.  RISKS AND COMPLICATIONS  Take your time so you do not get dizzy or  light-headed.  If you are in pain, you may need to take or ask for pain medication before doing incentive spirometry. It is harder to take a deep breath if you are having pain. AFTER USE  Rest and breathe slowly and easily.  It can be helpful to keep track of a log of your progress. Your caregiver can provide you with a simple table to help with this. If you are using the spirometer at home, follow these instructions: SEEK MEDICAL CARE IF:   You are having difficultly using the spirometer.  You have trouble using the spirometer as often as instructed.  Your pain medication is not giving enough relief while using the spirometer.  You develop fever of 100.5 F (38.1 C) or higher. SEEK IMMEDIATE MEDICAL CARE IF:   You cough up bloody sputum that had not been present before.  You develop fever of 102 F (38.9 C) or greater.  You develop worsening pain at or near the incision site. MAKE SURE YOU:   Understand these instructions.  Will watch your condition.  Will get help right away if you are not  doing well or get worse. Document Released: 11/09/2006 Document Revised: 09/21/2011 Document Reviewed: 01/10/2007 [facility] Patient Information 2014 [place], [place].   ________________________________________________________________________

## 2020-08-27 ENCOUNTER — Encounter (HOSPITAL_COMMUNITY)
Admission: RE | Admit: 2020-08-27 | Discharge: 2020-08-27 | Disposition: A | Discharge status: Home / Self Care | Payer: Medicare Other | Source: Ambulatory Visit | Attending: Orthopaedic Surgery | Admitting: Orthopaedic Surgery

## 2020-08-27 ENCOUNTER — Other Ambulatory Visit: Payer: Self-pay

## 2020-08-27 ENCOUNTER — Encounter (HOSPITAL_COMMUNITY): Payer: Self-pay

## 2020-08-27 ENCOUNTER — Ambulatory Visit (HOSPITAL_COMMUNITY)
Admission: RE | Admit: 2020-08-27 | Discharge: 2020-08-27 | Disposition: A | Discharge status: Home / Self Care | Payer: Medicare Other | Source: Ambulatory Visit | Attending: Orthopaedic Surgery | Admitting: Orthopaedic Surgery

## 2020-08-27 DIAGNOSIS — Z01818 Encounter for other preprocedural examination: Secondary | ICD-10-CM | POA: Insufficient documentation

## 2020-08-27 HISTORY — DX: Anesthesia of skin: R20.0

## 2020-08-27 HISTORY — DX: Paresthesia of skin: R20.2

## 2020-08-27 LAB — PROTIME-INR
INR: 1.1 (ref 0.8–1.2)
Prothrombin Time: 13.3 seconds (ref 11.4–15.2)

## 2020-08-27 LAB — GLUCOSE, CAPILLARY: Glucose-Capillary: 171 mg/dL — ABNORMAL HIGH (ref 70–99)

## 2020-08-27 LAB — CBC WITH DIFFERENTIAL/PLATELET
Abs Immature Granulocytes: 0.01 10*3/uL (ref 0.00–0.07)
Basophils Absolute: 0 10*3/uL (ref 0.0–0.1)
Basophils Relative: 1 %
Eosinophils Absolute: 0 10*3/uL (ref 0.0–0.5)
Eosinophils Relative: 1 %
HCT: 38.7 % (ref 36.0–46.0)
Hemoglobin: 12.6 g/dL (ref 12.0–15.0)
Immature Granulocytes: 0 %
Lymphocytes Relative: 31 %
Lymphs Abs: 1.3 10*3/uL (ref 0.7–4.0)
MCH: 30.1 pg (ref 26.0–34.0)
MCHC: 32.6 g/dL (ref 30.0–36.0)
MCV: 92.6 fL (ref 80.0–100.0)
Monocytes Absolute: 0.4 10*3/uL (ref 0.1–1.0)
Monocytes Relative: 9 %
Neutro Abs: 2.5 10*3/uL (ref 1.7–7.7)
Neutrophils Relative %: 58 %
Platelets: 217 10*3/uL (ref 150–400)
RBC: 4.18 MIL/uL (ref 3.87–5.11)
RDW: 12.6 % (ref 11.5–15.5)
WBC: 4.2 10*3/uL (ref 4.0–10.5)
nRBC: 0 % (ref 0.0–0.2)

## 2020-08-27 LAB — SURGICAL PCR SCREEN
MRSA, PCR: NEGATIVE
Staphylococcus aureus: NEGATIVE

## 2020-08-27 LAB — URINALYSIS, ROUTINE W REFLEX MICROSCOPIC
Bilirubin Urine: NEGATIVE
Glucose, UA: NEGATIVE mg/dL
Hgb urine dipstick: NEGATIVE
Ketones, ur: 5 mg/dL — AB
Leukocytes,Ua: NEGATIVE
Nitrite: NEGATIVE
Protein, ur: NEGATIVE mg/dL
Specific Gravity, Urine: 1.028 (ref 1.005–1.030)
pH: 5 (ref 5.0–8.0)

## 2020-08-27 LAB — BASIC METABOLIC PANEL
Anion gap: 11 (ref 5–15)
BUN: 18 mg/dL (ref 8–23)
CO2: 22 mmol/L (ref 22–32)
Calcium: 9.8 mg/dL (ref 8.9–10.3)
Chloride: 103 mmol/L (ref 98–111)
Creatinine, Ser: 0.87 mg/dL (ref 0.44–1.00)
GFR, Estimated: >60 mL/min (ref >60)
Glucose, Bld: 202 mg/dL — ABNORMAL HIGH (ref 70–99)
Potassium: 4.9 mmol/L (ref 3.5–5.1)
Sodium: 136 mmol/L (ref 135–145)

## 2020-08-27 LAB — APTT: aPTT: 31 seconds (ref 24–36)

## 2020-08-27 NOTE — Progress Notes (Signed)
COVID Vaccine Completed:yes Date COVID Vaccine completed:09/05/19 COVID vaccine manufacturer:   Moderna    PCP - Dr. Pixie Casino Cardiologist - Dr. Trinna Post  Chest x-ray - 08/27/20-Epic EKG - 08/08/20-epic Stress Test - 10/23/19-epic ECHO - 06/01/17-epic Cardiac Cath - 2005, 2008 2 stents placed Pacemaker/ICD device last checked:NA  Sleep Study - no CPAP -   Fasting Blood Sugar - 117-130 Checks Blood Sugar _QD____ times a day  Blood Thinner Instructions:ASA81 and Plavix/ Dr. Chales Abrahams Aspirin Instructions:none Pt will call her MD to verify Last Dose:ASA 08/27/20. Plavix was stopped 08/20/20  Anesthesia review:   Patient denies shortness of breath, fever, cough and chest pain at PAT appointment yes  Patient verbalized understanding of instructions that were given to them at the PAT appointment. Patient was also instructed that they will need to review over the PAT instructions again at home before surgery.yes Pt uses a walker in her house and a cane while out. She gets SOB after 1 flight of stairs and sometimes while doing housework and getting dressed in the morning. This seemed to start after her back surgery in 2012.

## 2020-08-28 LAB — HEMOGLOBIN A1C
Hgb A1c MFr Bld: 8.6 % — ABNORMAL HIGH (ref 4.8–5.6)
Mean Plasma Glucose: 200 mg/dL

## 2020-08-30 ENCOUNTER — Other Ambulatory Visit (HOSPITAL_COMMUNITY): Payer: Medicare Other

## 2020-09-03 ENCOUNTER — Ambulatory Visit (HOSPITAL_COMMUNITY): Admission: RE | Admit: 2020-09-03 | Payer: Medicare Other | Source: Home / Self Care | Admitting: Orthopaedic Surgery

## 2020-09-03 ENCOUNTER — Encounter (HOSPITAL_COMMUNITY): Admission: RE | Payer: Self-pay | Source: Home / Self Care

## 2020-09-03 LAB — TYPE AND SCREEN
ABO/RH(D): B POS
Antibody Screen: NEGATIVE

## 2020-09-03 SURGERY — ARTHROPLASTY, HIP, TOTAL, ANTERIOR APPROACH
Anesthesia: Spinal | Site: Hip | Laterality: Left

## 2020-10-16 ENCOUNTER — Other Ambulatory Visit: Payer: Self-pay | Admitting: Orthopaedic Surgery

## 2020-10-16 DIAGNOSIS — Z01818 Encounter for other preprocedural examination: Secondary | ICD-10-CM

## 2020-11-05 NOTE — Progress Notes (Addendum)
COVID Vaccine Completed:  x2 Date COVID Vaccine completed: Unsure of dates Has received booster: COVID vaccine manufacturer: Pfizer    [employer]   Date of COVID positive in last 90 days: N/A  PCP - Irena Reichmann, MD Cardiologist - Dyane Dustman (notes Care Everywhere)  Chest x-ray - 08-27-20 Epic EKG - 11-06-20 Epic Stress Test - 10-23-19 Care Everywhere ECHO - 10-24-20 Care Everywhere Cardiac Cath -  Pacemaker/ICD device last checked: Spinal Cord Stimulator: Cardiac Monitor - 10-17-19 Care Everywhere  Sleep Study - N/A CPAP -   Fasting Blood Sugar - 42 to 200 Checks Blood Sugar - 2 times a day  Blood Thinner Instructions:  Plavix 75 mg. Patient self stopped two months ago Aspirin Instructions: ASA 81 mg  Last Dose: 11-05-20  Activity level:  Can go up a flight of stairs and perform activities of daily living without stopping and without symptoms of chest pain or shortness of breath. Patient lives alone, cares for self and continues to drive.    Anesthesia review:  Hx of MI with stent placement, HTN and hx of stroke  Recent eval for facial droop and aphasia, 10-23-20 at [facility].  Diagnosed as a seizure, on Keppra per patient.  A1c 7.9 10-23-20. Repeated 11-06-20  Patient denies shortness of breath, fever, cough and chest pain at PAT appointment   Patient verbalized understanding of instructions that were given to them at the PAT appointment. Patient was also instructed that they will need to review over the PAT instructions again at home before surgery.

## 2020-11-05 NOTE — Patient Instructions (Addendum)
DUE TO COVID-19 ONLY ONE VISITOR IS ALLOWED TO COME WITH YOU AND STAY IN THE WAITING ROOM ONLY DURING PRE OP AND PROCEDURE.   **NO VISITORS ARE ALLOWED IN THE SHORT STAY AREA OR RECOVERY ROOM!!**  IF YOU WILL BE ADMITTED INTO THE HOSPITAL YOU ARE ALLOWED ONLY TWO SUPPORT PEOPLE DURING VISITATION HOURS ONLY (10AM -8PM)   . The support person(s) may change daily. . The support person(s) must pass our screening, gel in and out, and wear a mask at all times, including in the patient's room. . Patients must also wear a mask when staff or their support person are in the room.  No visitors under the age of 79. Any visitor under the age of 79 must be accompanied by an adult.    COVID SWAB TESTING MUST BE COMPLETED ON:  Friday, 11-08-20 @ 12:30 PM   [address] [place], [place] [id number]  (Must self quarantine after testing. Follow instructions on handout.)       Your procedure is scheduled on:  Tuesday, 11-12-20   [facility]    Report to admitting at 7:30 AM   Call this number if you have problems the morning of surgery [phone]   Do not eat food :After Midnight.   May have liquids until 7:00 AM day of surgery  CLEAR LIQUID DIET  Foods Allowed                                                                     Foods Excluded  Water, Black Coffee and tea, regular and decaf              liquids that you cannot  Plain Jell-O in any flavor  (No red)                                     see through such as: Fruit ices (not with fruit pulp)                                      milk, soups, orange juice              Iced Popsicles (No red)                                      All solid food                                   Apple juices Sports drinks like Gatorade (No red) Lightly seasoned clear broth or consume(fat free) Sugar, honey syrup     Oral Hygiene is also important to reduce your risk of infection.                                    Remember  - BRUSH YOUR TEETH THE MORNING OF SURGERY  WITH YOUR REGULAR TOOTHPASTE   Do NOT smoke after Midnight   Take these medicines the morning of surgery with A SIP OF WATER:   Keppra, Pantoprazole, Pravastatin, Carvediolol   How to Manage Your Diabetes Before and After Surgery  Why is it important to control my blood sugar before and after surgery? . Improving blood sugar levels before and after surgery helps healing and can limit problems. . A way of improving blood sugar control is eating a healthy diet by: o  Eating less sugar and carbohydrates o  Increasing activity/exercise o  Talking with your doctor about reaching your blood sugar goals . High blood sugars (greater than 180 mg/dL) can raise your risk of infections and slow your recovery, so you will need to focus on controlling your diabetes during the weeks before surgery. . Make sure that the doctor who takes care of your diabetes knows about your planned surgery including the date and location.  How do I manage my blood sugar before surgery? . Check your blood sugar at least 4 times a day, starting 2 days before surgery, to make sure that the level is not too high or low. o Check your blood sugar the morning of your surgery when you wake up and every 2 hours until you get to the Short Stay unit. . If your blood sugar is less than 70 mg/dL, you will need to treat for low blood sugar: o Do not take insulin. o Treat a low blood sugar (less than 70 mg/dL) with  cup of clear juice (cranberry or apple), 4 glucose tablets, OR glucose gel. o Recheck blood sugar in 15 minutes after treatment (to make sure it is greater than 70 mg/dL). If your blood sugar is not greater than 70 mg/dL on recheck, call [phone] for further instructions. . Report your blood sugar to the short stay nurse when you get to Short Stay.  . If you are admitted to the hospital after surgery: o Your blood sugar will be checked by the staff and you will probably be  given insulin after surgery (instead of oral diabetes medicines) to make sure you have good blood sugar levels. o The goal for blood sugar control after surgery is 80-180 mg/dL.   WHAT DO I DO ABOUT MY DIABETES MEDICATION?  Marland Kitchen Do not take oral diabetes medicines (pills) the morning of surgery.  . THE DAY BEFORE SURGERY:  Take Glimepiride in the morning, do not take evening dose.       Take Metformin as prescribed.       . THE MORNING OF SURGERY:  Do not take Glimepiride or Metformin.   Reviewed and Endorsed by [facility] Patient Education Committee, August 2015                                You may not have any metal on your body including hair pins, jewelry, and body piercings             Do not wear make-up, lotions, powders, perfumes/cologne, or deodorant             Do not wear nail polish.  Do not shave  48 hours prior to surgery.    Do not bring valuables to the hospital. [facility] IS NOT RESPONSIBLE   FOR VALUABLES.   Contacts, dentures or bridgework may not be worn into surgery.    Patients discharged the day of surgery  will not be allowed to drive home.                Please read over the following fact sheets you were given: IF YOU HAVE QUESTIONS ABOUT YOUR PRE OP INSTRUCTIONS PLEASE CALL  [phone] [facility] - Preparing for Surgery Before surgery, you can play an important role.  Because skin is not sterile, your skin needs to be as free of germs as possible.  You can reduce the number of germs on your skin by washing with CHG (chlorahexidine gluconate) soap before surgery.  CHG is an antiseptic cleaner which kills germs and bonds with the skin to continue killing germs even after washing. Please DO NOT use if you have an allergy to CHG or antibacterial soaps.  If your skin becomes reddened/irritated stop using the CHG and inform your nurse when you arrive at Short Stay. Do not shave (including legs and underarms) for at least 48 hours prior to the first  CHG shower.  You may shave your face/neck.  Please follow these instructions carefully:  1.  Shower with CHG Soap the night before surgery and the  morning of surgery.  2.  If you choose to wash your hair, wash your hair first as usual with your normal  shampoo.  3.  After you shampoo, rinse your hair and body thoroughly to remove the shampoo.                             4.  Use CHG as you would any other liquid soap.  You can apply chg directly to the skin and wash.  Gently with a scrungie or clean washcloth.  5.  Apply the CHG Soap to your body ONLY FROM THE NECK DOWN.   Do   not use on face/ open                           Wound or open sores. Avoid contact with eyes, ears mouth and   genitals (private parts).                       Wash face,  Genitals (private parts) with your normal soap.             6.  Wash thoroughly, paying special attention to the area where your    surgery  will be performed.  7.  Thoroughly rinse your body with warm water from the neck down.  8.  DO NOT shower/wash with your normal soap after using and rinsing off the CHG Soap.                9.  Pat yourself dry with a clean towel.            10.  Wear clean pajamas.            11.  Place clean sheets on your bed the night of your first shower and do not  sleep with pets. Day of Surgery : Do not apply any lotions/deodorants the morning of surgery.  Please wear clean clothes to the hospital/surgery center.  FAILURE TO FOLLOW THESE INSTRUCTIONS MAY RESULT IN THE CANCELLATION OF YOUR SURGERY  PATIENT SIGNATURE_________________________________  NURSE SIGNATURE__________________________________  ________________________________________________________________________   Rogelia Mire  An incentive spirometer is a tool that can help keep your lungs clear and active. This tool measures how  well you are filling your lungs with each breath. Taking long deep breaths may help reverse or decrease the chance of  developing breathing (pulmonary) problems (especially infection) following:  A long period of time when you are unable to move or be active. BEFORE THE PROCEDURE   If the spirometer includes an indicator to show your best effort, your nurse or respiratory therapist will set it to a desired goal.  If possible, sit up straight or lean slightly forward. Try not to slouch.  Hold the incentive spirometer in an upright position. INSTRUCTIONS FOR USE  1. Sit on the edge of your bed if possible, or sit up as far as you can in bed or on a chair. 2. Hold the incentive spirometer in an upright position. 3. Breathe out normally. 4. Place the mouthpiece in your mouth and seal your lips tightly around it. 5. Breathe in slowly and as deeply as possible, raising the piston or the ball toward the top of the column. 6. Hold your breath for 3-5 seconds or for as long as possible. Allow the piston or ball to fall to the bottom of the column. 7. Remove the mouthpiece from your mouth and breathe out normally. 8. Rest for a few seconds and repeat Steps 1 through 7 at least 10 times every 1-2 hours when you are awake. Take your time and take a few normal breaths between deep breaths. 9. The spirometer may include an indicator to show your best effort. Use the indicator as a goal to work toward during each repetition. 10. After each set of 10 deep breaths, practice coughing to be sure your lungs are clear. If you have an incision (the cut made at the time of surgery), support your incision when coughing by placing a pillow or rolled up towels firmly against it. Once you are able to get out of bed, walk around indoors and cough well. You may stop using the incentive spirometer when instructed by your caregiver.  RISKS AND COMPLICATIONS  Take your time so you do not get dizzy or light-headed.  If you are in pain, you may need to take or ask for pain medication before doing incentive spirometry. It is harder to take a  deep breath if you are having pain. AFTER USE  Rest and breathe slowly and easily.  It can be helpful to keep track of a log of your progress. Your caregiver can provide you with a simple table to help with this. If you are using the spirometer at home, follow these instructions: SEEK MEDICAL CARE IF:   You are having difficultly using the spirometer.  You have trouble using the spirometer as often as instructed.  Your pain medication is not giving enough relief while using the spirometer.  You develop fever of 100.5 F (38.1 C) or higher. SEEK IMMEDIATE MEDICAL CARE IF:   You cough up bloody sputum that had not been present before.  You develop fever of 102 F (38.9 C) or greater.  You develop worsening pain at or near the incision site. MAKE SURE YOU:   Understand these instructions.  Will watch your condition.  Will get help right away if you are not doing well or get worse. Document Released: 11/09/2006 Document Revised: 09/21/2011 Document Reviewed: 01/10/2007 ExitCare Patient Information 2014 ExitCare, [place].   ________________________________________________________________________  WHAT IS A BLOOD TRANSFUSION? Blood Transfusion Information  A transfusion is the replacement of blood or some of its parts. Blood is made up of multiple cells  which provide different functions.  Red blood cells carry oxygen and are used for blood loss replacement.  White blood cells fight against infection.  Platelets control bleeding.  Plasma helps clot blood.  Other blood products are available for specialized needs, such as hemophilia or other clotting disorders. BEFORE THE TRANSFUSION  Who gives blood for transfusions?   Healthy volunteers who are fully evaluated to make sure their blood is safe. This is blood bank blood. Transfusion therapy is the safest it has ever been in the practice of medicine. Before blood is taken from a donor, a complete history is taken to make  sure that person has no history of diseases nor engages in risky social behavior (examples are intravenous drug use or sexual activity with multiple partners). The donor's travel history is screened to minimize risk of transmitting infections, such as malaria. The donated blood is tested for signs of infectious diseases, such as HIV and hepatitis. The blood is then tested to be sure it is compatible with you in order to minimize the chance of a transfusion reaction. If you or a relative donates blood, this is often done in anticipation of surgery and is not appropriate for emergency situations. It takes many days to process the donated blood. RISKS AND COMPLICATIONS Although transfusion therapy is very safe and saves many lives, the main dangers of transfusion include:   Getting an infectious disease.  Developing a transfusion reaction. This is an allergic reaction to something in the blood you were given. Every precaution is taken to prevent this. The decision to have a blood transfusion has been considered carefully by your caregiver before blood is given. Blood is not given unless the benefits outweigh the risks. AFTER THE TRANSFUSION  Right after receiving a blood transfusion, you will usually feel much better and more energetic. This is especially true if your red blood cells have gotten low (anemic). The transfusion raises the level of the red blood cells which carry oxygen, and this usually causes an energy increase.  The nurse administering the transfusion will monitor you carefully for complications. HOME CARE INSTRUCTIONS  No special instructions are needed after a transfusion. You may find your energy is better. Speak with your caregiver about any limitations on activity for underlying diseases you may have. SEEK MEDICAL CARE IF:   Your condition is not improving after your transfusion.  You develop redness or irritation at the intravenous (IV) site. SEEK IMMEDIATE MEDICAL CARE IF:   Any of the following symptoms occur over the next 12 hours:  Shaking chills.  You have a temperature by mouth above 102 F (38.9 C), not controlled by medicine.  Chest, back, or muscle pain.  People around you feel you are not acting correctly or are confused.  Shortness of breath or difficulty breathing.  Dizziness and fainting.  You get a rash or develop hives.  You have a decrease in urine output.  Your urine turns a dark color or changes to pink, red, or brown. Any of the following symptoms occur over the next 10 days:  You have a temperature by mouth above 102 F (38.9 C), not controlled by medicine.  Shortness of breath.  Weakness after normal activity.  The white part of the eye turns yellow (jaundice).  You have a decrease in the amount of urine or are urinating less often.  Your urine turns a dark color or changes to pink, red, or brown. Document Released: 06/26/2000 Document Revised: 09/21/2011 Document Reviewed: 02/13/2008 ExitCare Patient  Information 2014 [place], [place].  _______________________________________________________________________

## 2020-11-06 ENCOUNTER — Encounter (HOSPITAL_COMMUNITY)
Admission: RE | Admit: 2020-11-06 | Discharge: 2020-11-06 | Disposition: A | Discharge status: Home / Self Care | Payer: Medicare (Managed Care) | Source: Ambulatory Visit | Attending: Orthopaedic Surgery | Admitting: Orthopaedic Surgery

## 2020-11-06 ENCOUNTER — Encounter (HOSPITAL_COMMUNITY): Payer: Self-pay

## 2020-11-06 ENCOUNTER — Other Ambulatory Visit: Payer: Self-pay

## 2020-11-06 DIAGNOSIS — I4510 Unspecified right bundle-branch block: Secondary | ICD-10-CM | POA: Diagnosis not present

## 2020-11-06 DIAGNOSIS — Z01818 Encounter for other preprocedural examination: Secondary | ICD-10-CM | POA: Insufficient documentation

## 2020-11-06 HISTORY — DX: Unspecified convulsions: R56.9

## 2020-11-06 LAB — URINALYSIS, ROUTINE W REFLEX MICROSCOPIC
Bilirubin Urine: NEGATIVE
Glucose, UA: NEGATIVE mg/dL
Ketones, ur: 20 mg/dL — AB
Leukocytes,Ua: NEGATIVE
Nitrite: NEGATIVE
Protein, ur: NEGATIVE mg/dL
Specific Gravity, Urine: 1.016 (ref 1.005–1.030)
pH: 5 (ref 5.0–8.0)

## 2020-11-06 LAB — CBC WITH DIFFERENTIAL/PLATELET
Abs Immature Granulocytes: 0.03 10*3/uL (ref 0.00–0.07)
Basophils Absolute: 0 10*3/uL (ref 0.0–0.1)
Basophils Relative: 1 %
Eosinophils Absolute: 0 10*3/uL (ref 0.0–0.5)
Eosinophils Relative: 1 %
HCT: 39 % (ref 36.0–46.0)
Hemoglobin: 12.9 g/dL (ref 12.0–15.0)
Immature Granulocytes: 1 %
Lymphocytes Relative: 30 %
Lymphs Abs: 1.6 10*3/uL (ref 0.7–4.0)
MCH: 30.6 pg (ref 26.0–34.0)
MCHC: 33.1 g/dL (ref 30.0–36.0)
MCV: 92.4 fL (ref 80.0–100.0)
Monocytes Absolute: 0.4 10*3/uL (ref 0.1–1.0)
Monocytes Relative: 8 %
Neutro Abs: 3.2 10*3/uL (ref 1.7–7.7)
Neutrophils Relative %: 59 %
Platelets: 241 10*3/uL (ref 150–400)
RBC: 4.22 MIL/uL (ref 3.87–5.11)
RDW: 13 % (ref 11.5–15.5)
WBC: 5.2 10*3/uL (ref 4.0–10.5)
nRBC: 0 % (ref 0.0–0.2)

## 2020-11-06 LAB — PROTIME-INR
INR: 1 (ref 0.8–1.2)
Prothrombin Time: 12.9 seconds (ref 11.4–15.2)

## 2020-11-06 LAB — HEMOGLOBIN A1C
Hgb A1c MFr Bld: 7.7 % — ABNORMAL HIGH (ref 4.8–5.6)
Mean Plasma Glucose: 174.29 mg/dL

## 2020-11-06 LAB — BASIC METABOLIC PANEL
Anion gap: 11 (ref 5–15)
BUN: 21 mg/dL (ref 8–23)
CO2: 22 mmol/L (ref 22–32)
Calcium: 10.3 mg/dL (ref 8.9–10.3)
Chloride: 105 mmol/L (ref 98–111)
Creatinine, Ser: 0.87 mg/dL (ref 0.44–1.00)
GFR, Estimated: >60 mL/min (ref >60)
Glucose, Bld: 149 mg/dL — ABNORMAL HIGH (ref 70–99)
Potassium: 4.6 mmol/L (ref 3.5–5.1)
Sodium: 138 mmol/L (ref 135–145)

## 2020-11-06 LAB — GLUCOSE, CAPILLARY: Glucose-Capillary: 135 mg/dL — ABNORMAL HIGH (ref 70–99)

## 2020-11-06 LAB — SURGICAL PCR SCREEN
MRSA, PCR: NEGATIVE
Staphylococcus aureus: NEGATIVE

## 2020-11-06 LAB — APTT: aPTT: 29 seconds (ref 24–36)

## 2020-11-06 NOTE — H&P (Signed)
TOTAL HIP ADMISSION H&P   Patient is admitted for left total hip arthroplasty.  Subjective:  Chief Complaint: left hip pain  HPI: [name], 80 y.o. female, has a history of pain and functional disability in the left hip(s) due to arthritis and patient has failed non-surgical conservative treatments for greater than 12 weeks to include NSAID's and/or analgesics, corticosteriod injections, flexibility and strengthening excercises, use of assistive devices, weight reduction as appropriate and activity modification.  Onset of symptoms was gradual starting 5 years ago with gradually worsening course since that time.The patient noted no past surgery on the left hip(s).  Patient currently rates pain in the left hip at 10 out of 10 with activity. Patient has night pain, worsening of pain with activity and weight bearing, trendelenberg gait, pain that interfers with activities of daily living and crepitus. Patient has evidence of subchondral cysts, subchondral sclerosis, periarticular osteophytes and joint space narrowing by imaging studies. This condition presents safety issues increasing the risk of falls. There is no current active infection.  Patient Active Problem List   Diagnosis Date Noted  . Spinal stenosis of lumbar region with radiculopathy 11/21/2019  . CVA (cerebral vascular accident) (HCC) 05/31/2017  . Stroke (cerebrum) (HCC) 05/31/2017  . Colon cancer screening 04/13/2013  . IBS (irritable bowel syndrome) 11/24/2012  . HTN (hypertension) 11/24/2012  . Diabetes (HCC) 11/24/2012  . S/P coronary artery stent placement 11/24/2012  . Hx of myocardial infarction 11/24/2012  . CVA (cerebral infarction) 11/24/2012  . H/O gastric bypass 11/24/2012  . S/P cholecystectomy 11/24/2012   Past Medical History:  Diagnosis Date  . Arthritis    lower back  . Chronic back pain    S/P MRI [id number] large left paracentral disc herniation  . Coronary artery disease   . Dizzy spells 04/13/2019    after a fall. dizzy when rolling over in bed or moving quickly  . DM2 (diabetes mellitus, type 2) (HCC)   . Dyspnea   . Family history of adverse reaction to anesthesia    Sister had PONV  . GERD (gastroesophageal reflux disease)   . Headache(784.0)    Carotid US/ Negative/[facility]  . Hiatal hernia   . Hyperlipidemia   . Hypertension   . Insomnia   . MI (myocardial infarction) (HCC) 2005   P stent x3 in 2008  . Numbness and tingling    left foot after back surgery  . Seizure (HCC)   . Stroke [facility])    "Mini strokes"  . TIA (transient ischemic attack) 2013   08-25-2011, 09-06-2011  . Vascular migraine     Past Surgical History:  Procedure Laterality Date  . ANTERIOR AND POSTERIOR VAGINAL REPAIR    . BACK SURGERY  07/31/2010   10/30/2010  . BREAST SURGERY  1961   Right breast cyst removal in [facility]  . CARDIAC CATHETERIZATION  08/22/2006,2005  . CYSTOCELE REPAIR  12/03/2008  . Gastric bypass surgery  1984  . SQUAMOUS CELL CARCINOMA EXCISION     Removed from temple and lt eyebrow.  . TONSILLECTOMY    . TOTAL ABDOMINAL HYSTERECTOMY    . Tummy tuck  1984  . Unilateral salpingoophorectomy  1983    No current facility-administered medications for this encounter.   Current Outpatient Medications  Medication Sig Dispense Refill Last Dose  . aspirin EC 81 MG tablet Take 81 mg by mouth at bedtime.     Marland Kitchen glimepiride (AMARYL) 4 MG tablet Take 4 mg 2 (two) times daily by  mouth.     Marland Kitchen HYDROcodone-acetaminophen (NORCO/VICODIN) 5-325 MG tablet Take 1-2 tablets by mouth every 6 (six) hours as needed for moderate pain. 60 tablet 0   . levETIRAcetam (KEPPRA) 500 MG tablet Take 500 mg by mouth in the morning and at bedtime.     . metFORMIN (GLUCOPHAGE) 500 MG tablet Take 500 mg by mouth in the morning, at noon, and at bedtime.     . pantoprazole (PROTONIX) 40 MG tablet Take 1 tablet (40 mg total) by mouth daily. Take 1 tab twice a day. (Patient taking differently: Take 40 mg  by mouth 2 (two) times daily.) 30 tablet 3   . rosuvastatin (CRESTOR) 5 MG tablet Take 5 mg by mouth in the morning and at bedtime.     . carvedilol (COREG) 25 MG tablet Take 25 mg by mouth 2 (two) times daily with a meal.      Allergies  Allergen Reactions  . Escitalopram Oxalate     Pt unsure of reaction  . Byetta 5 Mcg Pen [Exenatide] Nausea Only  . Codeine Sulfate Other (See Comments)    Unknown   . Metoprolol Tartrate Cough  . Lipitor [Atorvastatin] Palpitations    Felt like heart attack    Social History   Tobacco Use  . Smoking status: Never Smoker  . Smokeless tobacco: Never Used  Substance Use Topics  . Alcohol use: No    Family History  Problem Relation Age of Onset  . Heart attack Father        Died at 14  . Heart attack Sister   . Diabetes Mellitus II Mother      Review of Systems  Musculoskeletal: Positive for arthralgias.       Left hip  All other systems reviewed and are negative.   Objective:  Physical Exam Constitutional:      Appearance: Normal appearance.  HENT:     Head: Normocephalic and atraumatic.     Nose: Nose normal.     Mouth/Throat:     Pharynx: Oropharynx is clear.  Eyes:     Extraocular Movements: Extraocular movements intact.  Cardiovascular:     Rate and Rhythm: Normal rate and regular rhythm.  Pulmonary:     Effort: Pulmonary effort is normal.  Abdominal:     Palpations: Abdomen is soft.  Musculoskeletal:     Cervical back: Normal range of motion.     Comments: Left hip motion is limited and extremely painful in rotation.  Her leg lengths are roughly equal.  She is walking with a markedly altered gait.  Sensation and motor function are intact distally with palpable pulses in her feet.  Skin:    General: Skin is warm and dry.  Neurological:     General: No focal deficit present.     Mental Status: She is alert and oriented to person, place, and time.  Psychiatric:        Mood and Affect: Mood normal.        Behavior:  Behavior normal.        Thought Content: Thought content normal.        Judgment: Judgment normal.     Vital signs in last 24 hours: Temp:  [97.7 F (36.5 C)] 97.7 F (36.5 C) (04/27 1304) Pulse Rate:  [80] 80 (04/27 1304) Resp:  [18] 18 (04/27 1304) BP: (158)/(88) 158/88 (04/27 1304) SpO2:  [100 %] 100 % (04/27 1304) Weight:  [59.9 kg] 59.9 kg (04/27 1304)  Labs:  Estimated body mass index is 19.49 kg/m as calculated from the following:   Height as of 11/06/20: 5\' 9"  (1.753 m).   Weight as of 11/06/20: 59.9 kg.   Imaging Review Plain radiographs demonstrate severe degenerative joint disease of the left hip(s). The bone quality appears to be good for age and reported activity level.      Assessment/Plan:  End stage primary arthritis, left hip(s)  The patient history, physical examination, clinical judgement of the provider and imaging studies are consistent with end stage degenerative joint disease of the left hip(s) and total hip arthroplasty is deemed medically necessary. The treatment options including medical management, injection therapy, arthroscopy and arthroplasty were discussed at length. The risks and benefits of total hip arthroplasty were presented and reviewed. The risks due to aseptic loosening, infection, stiffness, dislocation/subluxation,  thromboembolic complications and other imponderables were discussed.  The patient acknowledged the explanation, agreed to proceed with the plan and consent was signed. Patient is being admitted for inpatient treatment for surgery, pain control, PT, OT, prophylactic antibiotics, VTE prophylaxis, progressive ambulation and ADL's and discharge planning.The patient is planning to be discharged home with home health services

## 2020-11-07 NOTE — Progress Notes (Addendum)
Anesthesia Chart Review   Case: [id number] Date/Time: 11/12/20 0946   Procedure: LEFT TOTAL HIP ARTHROPLASTY ANTERIOR APPROACH (Left Hip)   Anesthesia type: Spinal   Pre-op diagnosis: LEFT HIP DEGENERATIVE JOINT DISEASE   Location: [facility] 06 / [facility]   Surgeons: Marcene Corning, MD      DISCUSSION:80 y.o. never smoker with h/o HTN, GERD, DM II, CAD (stent to LAD in 2005, DES to RCA in 2008), left hip djd scheduled for above procedure 11/12/2020 with Dr. Marcene Corning.   Last seen by cardiology 06/11/2020. Low risk stress test 10/23/2019. Stable at this visit with one year follow up recommended.   Recent admission 10/23/2020. Presented with right facial droop and difficulty talking. Sx felt to be secondary to focal aware seizure. Pt started on Keppra twice daily. No intracranial abnormality or acute infarct on MR Brain. Close follow up with neurology recommended.   Clearance from neurology received 11/07/2020 which states pt is optimized for surgery from a medical standpoint (from neurology). Advised to hold Plavix 5 days prior to procedure.  VS: BP (!) 158/88   Pulse 80   Temp 36.5 C (Oral)   Resp 18   Ht 5\' 9"  (1.753 m)   Wt 59.9 kg   SpO2 100%   BMI 19.49 kg/m   PROVIDERS: , DO is PCP   Irena Reichmann, MD is Cardiologist  LABS: Labs reviewed: Acceptable for surgery. (all labs ordered are listed, but only abnormal results are displayed)  Labs Reviewed  BASIC METABOLIC PANEL - Abnormal; Notable for the following components:      Result Value   Glucose, Bld 149 (*)    All other components within normal limits  URINALYSIS, ROUTINE W REFLEX MICROSCOPIC - Abnormal; Notable for the following components:   Hgb urine dipstick SMALL (*)    Ketones, ur 20 (*)    Bacteria, UA RARE (*)    All other components within normal limits  HEMOGLOBIN A1C - Abnormal; Notable for the following components:   Hgb A1c MFr Bld 7.7 (*)    All other components within normal limits   GLUCOSE, CAPILLARY - Abnormal; Notable for the following components:   Glucose-Capillary 135 (*)    All other components within normal limits  SURGICAL PCR SCREEN  CBC WITH DIFFERENTIAL/PLATELET  PROTIME-INR  APTT  TYPE AND SCREEN     IMAGES:   EKG: 11/06/2020 Rate 81 bpm  NSR Right atrial enlargement RBBB Although rate has increased otherwise no significant change   CV: Echo 10/24/2020 SUMMARY   The left ventricular size is normal.  There is normal left ventricular wall thickness.  Left ventricular systolic function is normal.  LV ejection fraction = 65-70%.  The right ventricle is normal in size and function.  Injection of agitated saline showed no right-to-left shunt.  There is aortic valvesclerosis.  There is trace aortic regurgitation.  The IVC is normal in size with an inspiratory collapse of greater then  50%, suggesting normal right atrial pressure.  There is no pericardial effusion.  There is no significant change in comparison withthe last study.   Stress Test 10/23/2019 Essentially normal cardiac perfusion exam. Prognostically this is a low risk scan.   Echo 06/01/2017 Study Conclusions   - Left ventricle: The cavity size was normal. There was mild  concentric hypertrophy. Systolic function was normal. The  estimated ejection fraction was in the range of 55% to 60%. Wall  motion was normal; there were no regional wall motion  abnormalities.  Doppler parameters are consistent with abnormal  left ventricular relaxation (grade 1 diastolic dysfunction).  - Aortic valve: There was trivial regurgitation.  - Mitral valve: Calcified annulus.  - Left atrium: The atrium was mildly dilated.  Past Medical History:  Diagnosis Date  . Arthritis    lower back  . Chronic back pain    S/P MRI 2011L5-S1 large left paracentral disc herniation  . Coronary artery disease   . Dizzy spells 04/13/2019   after a fall. dizzy when rolling over in bed or moving  quickly  . DM2 (diabetes mellitus, type 2) (HCC)   . Dyspnea   . Family history of adverse reaction to anesthesia    Sister had PONV  . GERD (gastroesophageal reflux disease)   . Headache(784.0)    Carotid US/ Negative/[facility]  . Hiatal hernia   . Hyperlipidemia   . Hypertension   . Insomnia   . MI (myocardial infarction) (HCC) 2005   P stent x3 in 2008  . Numbness and tingling    left foot after back surgery  . Seizure (HCC)   . Stroke [facility])    "Mini strokes"  . TIA (transient ischemic attack) 2013   08-25-2011, 09-06-2011  . Vascular migraine     Past Surgical History:  Procedure Laterality Date  . ANTERIOR AND POSTERIOR VAGINAL REPAIR    . BACK SURGERY  07/31/2010   10/30/2010  . BREAST SURGERY  1961   Right breast cyst removal in [facility]  . CARDIAC CATHETERIZATION  08/22/2006,2005  . CYSTOCELE REPAIR  12/03/2008  . Gastric bypass surgery  1984  . SQUAMOUS CELL CARCINOMA EXCISION     Removed from temple and lt eyebrow.  . TONSILLECTOMY    . TOTAL ABDOMINAL HYSTERECTOMY    . Tummy tuck  1984  . Unilateral salpingoophorectomy  1983    MEDICATIONS: . carvedilol (COREG) 25 MG tablet  . aspirin EC 81 MG tablet  . glimepiride (AMARYL) 4 MG tablet  . HYDROcodone-acetaminophen (NORCO/VICODIN) 5-325 MG tablet  . levETIRAcetam (KEPPRA) 500 MG tablet  . metFORMIN (GLUCOPHAGE) 500 MG tablet  . pantoprazole (PROTONIX) 40 MG tablet  . rosuvastatin (CRESTOR) 5 MG tablet   No current facility-administered medications for this encounter.   Jodell Cipro, PA-C [facility] Pre-Surgical Testing [phone]

## 2020-11-08 ENCOUNTER — Other Ambulatory Visit (HOSPITAL_COMMUNITY)
Admission: RE | Admit: 2020-11-08 | Discharge: 2020-11-08 | Disposition: A | Discharge status: Home / Self Care | Payer: Medicare (Managed Care) | Source: Ambulatory Visit | Attending: Orthopaedic Surgery | Admitting: Orthopaedic Surgery

## 2020-11-08 DIAGNOSIS — Z01812 Encounter for preprocedural laboratory examination: Secondary | ICD-10-CM | POA: Diagnosis present

## 2020-11-08 DIAGNOSIS — Z20822 Contact with and (suspected) exposure to covid-19: Secondary | ICD-10-CM | POA: Diagnosis not present

## 2020-11-08 LAB — SARS CORONAVIRUS 2 (TAT 6-24 HRS): SARS Coronavirus 2: NEGATIVE

## 2020-11-11 MED ORDER — TRANEXAMIC ACID 1000 MG/10ML IV SOLN
2000.0000 mg | INTRAVENOUS | Status: DC
  Filled 2020-11-11: qty 20

## 2020-11-11 MED ORDER — BUPIVACAINE LIPOSOME 1.3 % IJ SUSP
10.0000 mL | Freq: Once | INTRAMUSCULAR | Status: DC
  Filled 2020-11-11: qty 10

## 2020-11-12 ENCOUNTER — Ambulatory Visit (HOSPITAL_COMMUNITY): Payer: Medicare (Managed Care) | Admitting: Certified Registered Nurse Anesthetist

## 2020-11-12 ENCOUNTER — Ambulatory Visit (HOSPITAL_COMMUNITY): Payer: Medicare (Managed Care)

## 2020-11-12 ENCOUNTER — Encounter (HOSPITAL_COMMUNITY)
Admission: RE | Disposition: A | Discharge status: Home / Self Care | Payer: Self-pay | Source: Ambulatory Visit | Attending: Orthopaedic Surgery

## 2020-11-12 ENCOUNTER — Encounter (HOSPITAL_COMMUNITY): Payer: Self-pay | Admitting: Orthopaedic Surgery

## 2020-11-12 ENCOUNTER — Ambulatory Visit (HOSPITAL_COMMUNITY): Payer: Medicare (Managed Care) | Admitting: Physician Assistant

## 2020-11-12 ENCOUNTER — Observation Stay (HOSPITAL_COMMUNITY)
Admission: RE | Admit: 2020-11-12 | Discharge: 2020-11-19 | Disposition: A | Discharge status: Home / Self Care | Payer: Medicare (Managed Care) | Source: Ambulatory Visit | Attending: Orthopaedic Surgery | Admitting: Orthopaedic Surgery

## 2020-11-12 DIAGNOSIS — Z9889 Other specified postprocedural states: Secondary | ICD-10-CM

## 2020-11-12 DIAGNOSIS — Z7984 Long term (current) use of oral hypoglycemic drugs: Secondary | ICD-10-CM | POA: Insufficient documentation

## 2020-11-12 DIAGNOSIS — E119 Type 2 diabetes mellitus without complications: Secondary | ICD-10-CM | POA: Diagnosis not present

## 2020-11-12 DIAGNOSIS — Z79899 Other long term (current) drug therapy: Secondary | ICD-10-CM | POA: Insufficient documentation

## 2020-11-12 DIAGNOSIS — Z7982 Long term (current) use of aspirin: Secondary | ICD-10-CM | POA: Insufficient documentation

## 2020-11-12 DIAGNOSIS — I2510 Atherosclerotic heart disease of native coronary artery without angina pectoris: Secondary | ICD-10-CM | POA: Insufficient documentation

## 2020-11-12 DIAGNOSIS — Z20822 Contact with and (suspected) exposure to covid-19: Secondary | ICD-10-CM | POA: Diagnosis not present

## 2020-11-12 DIAGNOSIS — S82122A Displaced fracture of lateral condyle of left tibia, initial encounter for closed fracture: Secondary | ICD-10-CM

## 2020-11-12 DIAGNOSIS — Z01818 Encounter for other preprocedural examination: Secondary | ICD-10-CM

## 2020-11-12 DIAGNOSIS — M1612 Unilateral primary osteoarthritis, left hip: Secondary | ICD-10-CM | POA: Diagnosis present

## 2020-11-12 DIAGNOSIS — I10 Essential (primary) hypertension: Secondary | ICD-10-CM | POA: Diagnosis not present

## 2020-11-12 HISTORY — PX: TOTAL HIP ARTHROPLASTY: SHX124

## 2020-11-12 LAB — TYPE AND SCREEN
ABO/RH(D): B POS
Antibody Screen: NEGATIVE

## 2020-11-12 LAB — GLUCOSE, CAPILLARY
Glucose-Capillary: 129 mg/dL — ABNORMAL HIGH (ref 70–99)
Glucose-Capillary: 111 mg/dL — ABNORMAL HIGH (ref 70–99)
Glucose-Capillary: 254 mg/dL — ABNORMAL HIGH (ref 70–99)

## 2020-11-12 SURGERY — ARTHROPLASTY, HIP, TOTAL, ANTERIOR APPROACH
Anesthesia: Spinal | Site: Hip | Laterality: Left

## 2020-11-12 MED ORDER — METFORMIN HCL 500 MG PO TABS
500.0000 mg | ORAL_TABLET | Freq: Two times a day (BID) | ORAL | Status: DC
  Administered 2020-11-13 – 2020-11-19 (×13): 500 mg via ORAL
  Filled 2020-11-12 (×13): qty 1

## 2020-11-12 MED ORDER — MIDAZOLAM HCL 2 MG/2ML IJ SOLN
INTRAMUSCULAR | Status: AC
  Filled 2020-11-12: qty 2

## 2020-11-12 MED ORDER — PANTOPRAZOLE SODIUM 40 MG PO TBEC
40.0000 mg | DELAYED_RELEASE_TABLET | Freq: Two times a day (BID) | ORAL | Status: DC
  Administered 2020-11-12 – 2020-11-19 (×14): 40 mg via ORAL
  Filled 2020-11-12 (×14): qty 1

## 2020-11-12 MED ORDER — PROPOFOL 10 MG/ML IV BOLUS
INTRAVENOUS | Status: DC | PRN
  Administered 2020-11-12: 20 mg via INTRAVENOUS

## 2020-11-12 MED ORDER — ASPIRIN 81 MG PO CHEW
81.0000 mg | CHEWABLE_TABLET | Freq: Two times a day (BID) | ORAL | Status: DC
  Administered 2020-11-13 – 2020-11-19 (×12): 81 mg via ORAL
  Filled 2020-11-12 (×13): qty 1

## 2020-11-12 MED ORDER — TRANEXAMIC ACID-NACL 1000-0.7 MG/100ML-% IV SOLN: 1000.0000 mg | Freq: Once | INTRAVENOUS | Status: DC

## 2020-11-12 MED ORDER — OXYCODONE HCL 5 MG PO TABS
5.0000 mg | ORAL_TABLET | Freq: Once | ORAL | Status: AC | PRN
  Administered 2020-11-12: 5 mg via ORAL

## 2020-11-12 MED ORDER — HYDROCODONE-ACETAMINOPHEN 5-325 MG PO TABS: 1.0000 | ORAL_TABLET | Freq: Four times a day (QID) | ORAL | 0 refills | Status: DC | PRN

## 2020-11-12 MED ORDER — SODIUM CHLORIDE (PF) 0.9 % IJ SOLN
INTRAMUSCULAR | Status: AC
  Filled 2020-11-12: qty 30

## 2020-11-12 MED ORDER — ONDANSETRON HCL 4 MG PO TABS: 4.0000 mg | ORAL_TABLET | Freq: Four times a day (QID) | ORAL | Status: DC | PRN

## 2020-11-12 MED ORDER — PHENYLEPHRINE 40 MCG/ML (10ML) SYRINGE FOR IV PUSH (FOR BLOOD PRESSURE SUPPORT)
PREFILLED_SYRINGE | INTRAVENOUS | Status: AC
  Filled 2020-11-12: qty 10

## 2020-11-12 MED ORDER — HYDROCODONE-ACETAMINOPHEN 5-325 MG PO TABS
1.0000 | ORAL_TABLET | ORAL | Status: DC | PRN
Start: 2020-11-12 — End: 2020-11-18
  Administered 2020-11-13 (×2): 2 via ORAL
  Administered 2020-11-14: 1 via ORAL
  Administered 2020-11-14 – 2020-11-15 (×3): 2 via ORAL
  Administered 2020-11-17 (×2): 1 via ORAL
  Filled 2020-11-12 (×2): qty 2
  Filled 2020-11-12: qty 1
  Filled 2020-11-12 (×4): qty 2
  Filled 2020-11-12: qty 1

## 2020-11-12 MED ORDER — BUPIVACAINE LIPOSOME 1.3 % IJ SUSP
10.0000 mL | Freq: Once | INTRAMUSCULAR | Status: DC
  Filled 2020-11-12: qty 10

## 2020-11-12 MED ORDER — LACTATED RINGERS IV SOLN: INTRAVENOUS | Status: DC

## 2020-11-12 MED ORDER — PHENYLEPHRINE HCL-NACL 10-0.9 MG/250ML-% IV SOLN
INTRAVENOUS | Status: DC | PRN
  Administered 2020-11-12: 40 ug/min via INTRAVENOUS

## 2020-11-12 MED ORDER — LACTATED RINGERS IV BOLUS: 250.0000 mL | Freq: Once | INTRAVENOUS | Status: DC

## 2020-11-12 MED ORDER — GLIMEPIRIDE 4 MG PO TABS
4.0000 mg | ORAL_TABLET | Freq: Two times a day (BID) | ORAL | Status: DC
  Administered 2020-11-13 – 2020-11-19 (×13): 4 mg via ORAL
  Filled 2020-11-12 (×14): qty 1

## 2020-11-12 MED ORDER — POVIDONE-IODINE 10 % EX SWAB
2.0000 "application " | Freq: Once | CUTANEOUS | Status: AC
  Administered 2020-11-12: 2 via TOPICAL

## 2020-11-12 MED ORDER — ACETAMINOPHEN 500 MG PO TABS
500.0000 mg | ORAL_TABLET | Freq: Four times a day (QID) | ORAL | Status: AC
  Administered 2020-11-12 – 2020-11-13 (×3): 500 mg via ORAL
  Filled 2020-11-12 (×3): qty 1

## 2020-11-12 MED ORDER — CEFAZOLIN SODIUM-DEXTROSE 2-4 GM/100ML-% IV SOLN
2.0000 g | INTRAVENOUS | Status: AC
  Administered 2020-11-12: 2 g via INTRAVENOUS
  Filled 2020-11-12: qty 100

## 2020-11-12 MED ORDER — ROSUVASTATIN CALCIUM 5 MG PO TABS
5.0000 mg | ORAL_TABLET | Freq: Two times a day (BID) | ORAL | Status: DC
  Administered 2020-11-13 – 2020-11-19 (×13): 5 mg via ORAL
  Filled 2020-11-12 (×13): qty 1

## 2020-11-12 MED ORDER — MENTHOL 3 MG MT LOZG: 1.0000 | LOZENGE | OROMUCOSAL | Status: DC | PRN

## 2020-11-12 MED ORDER — TIZANIDINE HCL 4 MG PO TABS: 4.0000 mg | ORAL_TABLET | Freq: Four times a day (QID) | ORAL | 1 refills | Status: DC | PRN

## 2020-11-12 MED ORDER — MIDAZOLAM HCL 2 MG/2ML IJ SOLN
INTRAMUSCULAR | Status: DC | PRN
  Administered 2020-11-12 (×2): 1 mg via INTRAVENOUS

## 2020-11-12 MED ORDER — PROPOFOL 1000 MG/100ML IV EMUL
INTRAVENOUS | Status: AC
  Filled 2020-11-12: qty 100

## 2020-11-12 MED ORDER — ALUM & MAG HYDROXIDE-SIMETH 200-200-20 MG/5ML PO SUSP: 30.0000 mL | ORAL | Status: DC | PRN

## 2020-11-12 MED ORDER — ACETAMINOPHEN 325 MG PO TABS
325.0000 mg | ORAL_TABLET | Freq: Four times a day (QID) | ORAL | Status: DC | PRN
  Administered 2020-11-15 – 2020-11-19 (×5): 650 mg via ORAL
  Filled 2020-11-12 (×5): qty 2

## 2020-11-12 MED ORDER — KETOROLAC TROMETHAMINE 15 MG/ML IJ SOLN
7.5000 mg | Freq: Four times a day (QID) | INTRAMUSCULAR | Status: AC
  Administered 2020-11-12 – 2020-11-13 (×3): 7.5 mg via INTRAVENOUS
  Filled 2020-11-12: qty 1

## 2020-11-12 MED ORDER — TRANEXAMIC ACID-NACL 1000-0.7 MG/100ML-% IV SOLN: 1000.0000 mg | INTRAVENOUS | Status: DC

## 2020-11-12 MED ORDER — METHOCARBAMOL 500 MG IVPB - SIMPLE MED
INTRAVENOUS | Status: AC
  Filled 2020-11-12: qty 50

## 2020-11-12 MED ORDER — METHOCARBAMOL 500 MG PO TABS
500.0000 mg | ORAL_TABLET | Freq: Four times a day (QID) | ORAL | Status: DC | PRN
  Administered 2020-11-13 – 2020-11-16 (×6): 500 mg via ORAL
  Filled 2020-11-12 (×7): qty 1

## 2020-11-12 MED ORDER — METHOCARBAMOL 500 MG IVPB - SIMPLE MED
500.0000 mg | Freq: Four times a day (QID) | INTRAVENOUS | Status: DC | PRN
  Administered 2020-11-12: 500 mg via INTRAVENOUS
  Filled 2020-11-12: qty 50

## 2020-11-12 MED ORDER — LACTATED RINGERS IV BOLUS
250.0000 mL | Freq: Once | INTRAVENOUS | Status: AC
  Administered 2020-11-12: 250 mL via INTRAVENOUS

## 2020-11-12 MED ORDER — CEFAZOLIN SODIUM-DEXTROSE 2-4 GM/100ML-% IV SOLN
2.0000 g | Freq: Four times a day (QID) | INTRAVENOUS | Status: AC
  Administered 2020-11-12 – 2020-11-13 (×2): 2 g via INTRAVENOUS
  Filled 2020-11-12 (×2): qty 100

## 2020-11-12 MED ORDER — ORAL CARE MOUTH RINSE: 15.0000 mL | Freq: Once | OROMUCOSAL | Status: AC

## 2020-11-12 MED ORDER — LACTATED RINGERS IV BOLUS
500.0000 mL | Freq: Once | INTRAVENOUS | Status: AC
  Administered 2020-11-12: 500 mL via INTRAVENOUS

## 2020-11-12 MED ORDER — OXYCODONE HCL 5 MG PO TABS
ORAL_TABLET | ORAL | Status: AC
  Filled 2020-11-12: qty 1

## 2020-11-12 MED ORDER — PHENOL 1.4 % MT LIQD: 1.0000 | OROMUCOSAL | Status: DC | PRN

## 2020-11-12 MED ORDER — BUPIVACAINE-EPINEPHRINE (PF) 0.25% -1:200000 IJ SOLN
INTRAMUSCULAR | Status: DC | PRN
  Administered 2020-11-12: 30 mL

## 2020-11-12 MED ORDER — OXYCODONE HCL 5 MG/5ML PO SOLN
5.0000 mg | Freq: Once | ORAL | Status: AC | PRN
Start: 2020-11-12 — End: 2020-11-12

## 2020-11-12 MED ORDER — HYDROMORPHONE HCL 1 MG/ML IJ SOLN: 0.2500 mg | INTRAMUSCULAR | Status: DC | PRN

## 2020-11-12 MED ORDER — PROPOFOL 500 MG/50ML IV EMUL
INTRAVENOUS | Status: DC | PRN
  Administered 2020-11-12: 100 ug/kg/min via INTRAVENOUS

## 2020-11-12 MED ORDER — METOCLOPRAMIDE HCL 5 MG/ML IJ SOLN: 5.0000 mg | Freq: Three times a day (TID) | INTRAMUSCULAR | Status: DC | PRN

## 2020-11-12 MED ORDER — DOCUSATE SODIUM 100 MG PO CAPS
100.0000 mg | ORAL_CAPSULE | Freq: Two times a day (BID) | ORAL | Status: DC
  Administered 2020-11-12 – 2020-11-19 (×14): 100 mg via ORAL
  Filled 2020-11-12 (×14): qty 1

## 2020-11-12 MED ORDER — KETOROLAC TROMETHAMINE 15 MG/ML IJ SOLN
INTRAMUSCULAR | Status: AC
  Filled 2020-11-12: qty 1

## 2020-11-12 MED ORDER — CHLORHEXIDINE GLUCONATE 0.12 % MT SOLN
15.0000 mL | Freq: Once | OROMUCOSAL | Status: AC
  Administered 2020-11-12: 15 mL via OROMUCOSAL

## 2020-11-12 MED ORDER — BISACODYL 5 MG PO TBEC
5.0000 mg | DELAYED_RELEASE_TABLET | Freq: Every day | ORAL | Status: DC | PRN
  Administered 2020-11-18: 5 mg via ORAL
  Filled 2020-11-12: qty 1

## 2020-11-12 MED ORDER — CARVEDILOL 25 MG PO TABS
25.0000 mg | ORAL_TABLET | Freq: Two times a day (BID) | ORAL | Status: DC
  Administered 2020-11-12 – 2020-11-19 (×14): 25 mg via ORAL
  Filled 2020-11-12 (×14): qty 1

## 2020-11-12 MED ORDER — BUPIVACAINE LIPOSOME 1.3 % IJ SUSP
INTRAMUSCULAR | Status: DC | PRN
  Administered 2020-11-12: 10 mL

## 2020-11-12 MED ORDER — PROMETHAZINE HCL 25 MG/ML IJ SOLN: 6.2500 mg | INTRAMUSCULAR | Status: DC | PRN

## 2020-11-12 MED ORDER — 0.9 % SODIUM CHLORIDE (POUR BTL) OPTIME
TOPICAL | Status: DC | PRN
  Administered 2020-11-12: 1000 mL

## 2020-11-12 MED ORDER — TRANEXAMIC ACID 1000 MG/10ML IV SOLN
INTRAVENOUS | Status: DC | PRN
  Administered 2020-11-12: 2000 mg via TOPICAL

## 2020-11-12 MED ORDER — BUPIVACAINE HCL (PF) 0.75 % IJ SOLN
INTRAMUSCULAR | Status: DC | PRN
  Administered 2020-11-12: 1.8 mL via INTRATHECAL

## 2020-11-12 MED ORDER — TRANEXAMIC ACID-NACL 1000-0.7 MG/100ML-% IV SOLN
1000.0000 mg | INTRAVENOUS | Status: AC
  Administered 2020-11-12: 1000 mg via INTRAVENOUS
  Filled 2020-11-12: qty 100

## 2020-11-12 MED ORDER — CEFAZOLIN SODIUM-DEXTROSE 2-4 GM/100ML-% IV SOLN: 2.0000 g | INTRAVENOUS | Status: DC

## 2020-11-12 MED ORDER — STERILE WATER FOR IRRIGATION IR SOLN
Status: DC | PRN
  Administered 2020-11-12: 2000 mL

## 2020-11-12 MED ORDER — BUPIVACAINE-EPINEPHRINE (PF) 0.25% -1:200000 IJ SOLN
INTRAMUSCULAR | Status: AC
  Filled 2020-11-12: qty 30

## 2020-11-12 MED ORDER — MORPHINE SULFATE (PF) 2 MG/ML IV SOLN: 0.5000 mg | INTRAVENOUS | Status: DC | PRN

## 2020-11-12 MED ORDER — METOCLOPRAMIDE HCL 5 MG PO TABS: 5.0000 mg | ORAL_TABLET | Freq: Three times a day (TID) | ORAL | Status: DC | PRN

## 2020-11-12 MED ORDER — HYDROCODONE-ACETAMINOPHEN 7.5-325 MG PO TABS
1.0000 | ORAL_TABLET | ORAL | Status: DC | PRN
  Administered 2020-11-12 – 2020-11-15 (×4): 1 via ORAL
  Administered 2020-11-15: 2 via ORAL
  Administered 2020-11-16 (×3): 1 via ORAL
  Filled 2020-11-12 (×3): qty 1
  Filled 2020-11-12: qty 2
  Filled 2020-11-12 (×3): qty 1

## 2020-11-12 MED ORDER — HYDROCODONE-ACETAMINOPHEN 7.5-325 MG PO TABS
ORAL_TABLET | ORAL | Status: AC
  Filled 2020-11-12: qty 1

## 2020-11-12 MED ORDER — ONDANSETRON HCL 4 MG/2ML IJ SOLN: 4.0000 mg | Freq: Four times a day (QID) | INTRAMUSCULAR | Status: DC | PRN

## 2020-11-12 MED ORDER — TRANEXAMIC ACID 1000 MG/10ML IV SOLN
2000.0000 mg | INTRAVENOUS | Status: DC
  Filled 2020-11-12: qty 20

## 2020-11-12 MED ORDER — LEVETIRACETAM 500 MG PO TABS
500.0000 mg | ORAL_TABLET | Freq: Two times a day (BID) | ORAL | Status: DC
  Administered 2020-11-12 – 2020-11-19 (×14): 500 mg via ORAL
  Filled 2020-11-12 (×14): qty 1

## 2020-11-12 MED ORDER — POVIDONE-IODINE 10 % EX SWAB: 2.0000 "application " | Freq: Once | CUTANEOUS | Status: DC

## 2020-11-12 MED ORDER — DIPHENHYDRAMINE HCL 12.5 MG/5ML PO ELIX: 12.5000 mg | ORAL_SOLUTION | ORAL | Status: DC | PRN

## 2020-11-12 SURGICAL SUPPLY — 45 items
ARTICULEZE HEAD (Hips) ×2 IMPLANT
BAG DECANTER FOR FLEXI CONT (MISCELLANEOUS) ×2 IMPLANT
BLADE SAW SGTL 18X1.27X75 (BLADE) ×2 IMPLANT
BOOTIES KNEE HIGH SLOAN (MISCELLANEOUS) ×2 IMPLANT
CELLS DAT CNTRL 66122 CELL SVR (MISCELLANEOUS) ×1 IMPLANT
COVER PERINEAL POST (MISCELLANEOUS) ×2 IMPLANT
COVER SURGICAL LIGHT HANDLE (MISCELLANEOUS) ×2 IMPLANT
COVER WAND RF STERILE (DRAPES) IMPLANT
CUP PINN GRIPTON 54 100 (Cup) ×2 IMPLANT
DECANTER SPIKE VIAL GLASS SM (MISCELLANEOUS) ×2 IMPLANT
DRAPE IMP U-DRAPE 54X76 (DRAPES) ×2 IMPLANT
DRAPE ORTHO SPLIT 77X108 STRL (DRAPES)
DRAPE STERI IOBAN 125X83 (DRAPES) ×2 IMPLANT
DRAPE SURG ORHT 6 SPLT 77X108 (DRAPES) IMPLANT
DRAPE U-SHAPE 47X51 STRL (DRAPES) ×4 IMPLANT
DRSG AQUACEL AG ADV 3.5X 6 (GAUZE/BANDAGES/DRESSINGS) ×2 IMPLANT
DURAPREP 26ML APPLICATOR (WOUND CARE) ×2 IMPLANT
ELECT BLADE TIP CTD 4 INCH (ELECTRODE) ×2 IMPLANT
ELECT PENCIL ROCKER SW 15FT (MISCELLANEOUS) ×2 IMPLANT
ELECT REM PT RETURN 15FT ADLT (MISCELLANEOUS) ×2 IMPLANT
ELIMINATOR HOLE APEX DEPUY (Hips) ×2 IMPLANT
GLOVE SRG 8 PF TXTR STRL LF DI (GLOVE) ×2 IMPLANT
GLOVE SURG ENC MOIS LTX SZ8 (GLOVE) ×4 IMPLANT
GLOVE SURG UNDER POLY LF SZ8 (GLOVE) ×4
GOWN STRL REUS W/TWL XL LVL3 (GOWN DISPOSABLE) ×4 IMPLANT
HEAD ARTICULEZE (Hips) ×1 IMPLANT
HOLDER FOLEY CATH W/STRAP (MISCELLANEOUS) ×2 IMPLANT
KIT TURNOVER KIT A (KITS) ×2 IMPLANT
LINER NEUTRAL 54X36MM PLUS 4 (Hips) ×2 IMPLANT
MANIFOLD NEPTUNE II (INSTRUMENTS) ×2 IMPLANT
NEEDLE HYPO 22GX1.5 SAFETY (NEEDLE) ×2 IMPLANT
NS IRRIG 1000ML POUR BTL (IV SOLUTION) ×2 IMPLANT
PACK ANTERIOR HIP CUSTOM (KITS) ×2 IMPLANT
PENCIL SMOKE EVACUATOR (MISCELLANEOUS) IMPLANT
PROTECTOR NERVE ULNAR (MISCELLANEOUS) ×2 IMPLANT
RTRCTR WOUND ALEXIS 18CM MED (MISCELLANEOUS) ×2
STEM FEMORAL SZ 6MM STD ACTIS (Stem) ×2 IMPLANT
SUT ETHIBOND NAB CT1 #1 30IN (SUTURE) ×4 IMPLANT
SUT VIC AB 1 CT1 36 (SUTURE) ×2 IMPLANT
SUT VIC AB 2-0 CT1 27 (SUTURE) ×2
SUT VIC AB 2-0 CT1 TAPERPNT 27 (SUTURE) ×1 IMPLANT
SUT VICRYL AB 3-0 FS1 BRD 27IN (SUTURE) ×2 IMPLANT
SUT VLOC 180 0 24IN GS25 (SUTURE) ×2 IMPLANT
SYR 50ML LL SCALE MARK (SYRINGE) ×2 IMPLANT
TRAY FOLEY MTR SLVR 16FR STAT (SET/KITS/TRAYS/PACK) ×2 IMPLANT

## 2020-11-12 NOTE — Anesthesia Procedure Notes (Signed)
Spinal  Patient location during procedure: OR Start time: 11/12/2020 10:16 AM End time: 11/12/2020 10:20 AM Reason for block: surgical anesthesia Preanesthetic Checklist Completed: patient identified, IV checked, site marked, risks and benefits discussed, surgical consent, monitors and equipment checked, pre-op evaluation and timeout performed Spinal Block Patient position: sitting Prep: DuraPrep Patient monitoring: heart rate, cardiac monitor, continuous pulse ox and blood pressure Approach: midline Location: L3-4 Injection technique: single-shot Needle Needle type: Pencan  Needle gauge: 24 G Needle length: 10 cm Needle insertion depth: 9 cm Assessment Sensory level: T4 Events: CSF return

## 2020-11-12 NOTE — Op Note (Signed)
PRE-OP DIAGNOSIS:  LEFT HIP DEGENERATIVE JOINT DISEASE POST-OP DIAGNOSIS: same PROCEDURE:  LEFT TOTAL HIP ARTHROPLASTY ANTERIOR APPROACH ANESTHESIA:  Spinal and MAC SURGEON:  Melrose Nakayama MD ASSISTANT:  Loni Dolly PA-C   INDICATIONS FOR PROCEDURE:  The patient is a 80 y.o. female with a long history of a painful hip.  This has persisted despite multiple conservative measures.  The patient has persisted with pain and dysfunction making rest and activity difficult.  A total hip replacement is offered as surgical treatment.  Informed operative consent was obtained after discussion of possible complications including reaction to anesthesia, infection, neurovascular injury, dislocation, DVT, PE, and death.  The importance of the postoperative rehab program to optimize result was stressed with the patient.  SUMMARY OF FINDINGS AND PROCEDURE:  Under the above anesthesia through a anterior approach an the Hana table a left THR was performed.  The patient had severe degenerative change and good bone quality.  We used DePuy components to replace the hip and these were size 6 Actis femur capped with a +5 36m metal hip ball.  On the acetabular side we used a size 54 Gription shell with a plus 4 polyethylene liner.  We did use a hole eliminator.  ALoni DollyPA-C assisted throughout and was invaluable to the completion of the case in that he helped position and retract while I performed the procedure.  He also closed simultaneously to help minimize OR time.  I used fluoroscopy throughout the case to check position of components and leg lengths and read all these views myself.  DESCRIPTION OF PROCEDURE:  The patient was taken to the OR suite where the above anesthetic was applied.  The patient was then positioned on the Hana table supine.  All bony prominences were appropriately padded.  Prep and drape was then performed in normal sterile fashion.  The patient was given kefzol preoperative antibiotic and an  appropriate time out was performed.  We then took an anterior approach to the left hip.  Dissection was taken through adipose to the tensor fascia lata fascia.  This structure was incised longitudinally and we dissected in the intermuscular interval just medial to this muscle.  Cobra retractors were placed superior and inferior to the femoral neck superficial to the capsule.  A capsular incision was then made and the retractors were placed along the femoral neck.  Xray was brought in to get a good level for the femoral neck cut which was made with an oscillating saw and osteotome.  The femoral head was removed with a corkscrew.  The acetabulum was exposed and some labral tissues were excised. Reaming was taken to the inside wall of the pelvis and sequentially up to 1 mm smaller than the actual component.  A trial of components was done and then the aforementioned acetabular shell was placed in appropriate tilt and anteversion confirmed by fluoroscopy. The liner was placed along with the hole eliminator and attention was turned to the femur.  The leg was brought down and over into adduction and the elevator bar was used to raise the femur up gently in the wound.  The piriformis was released with care taken to preserve the obturator internus attachment and all of the posterior capsule. The femur was reamed and then broached to the appropriate size.  A trial reduction was done and the aforementioned head and neck assembly gave uKoreathe best stability in extension with external rotation.  Leg lengths were felt to be about equal by fluoroscopic exam.  The trial components were removed and the wound irrigated.  We then placed the femoral component in appropriate anteversion.  The head was applied to a dry stem neck and the hip again reduced.  It was again stable in the aforementioned position.  The would was irrigated again followed by re-approximation of anterior capsule with ethibond suture. Tensor fascia was repaired  with V-loc suture  followed by deep closure with #O and #2 undyed vicryl.  Skin was closed with subQ stitch and steristrips followed by a sterile dressing.  EBL and IOF can be obtained from anesthesia records.  DISPOSITION:  The patient was extubated in the OR and taken to PACU in stable condition to be admitted to the Orthopedic Surgery for appropriate post-op care to include perioperative antibiotics and DVT prophylaxis.

## 2020-11-12 NOTE — Interval H&P Note (Signed)
History and Physical Interval Note:  11/12/2020 9:01 AM  [name]  has presented today for surgery, with the diagnosis of LEFT HIP DEGENERATIVE JOINT DISEASE.  The various methods of treatment have been discussed with the patient and family. After consideration of risks, benefits and other options for treatment, the patient has consented to  Procedure(s): LEFT TOTAL HIP ARTHROPLASTY ANTERIOR APPROACH (Left) as a surgical intervention.  The patient's history has been reviewed, patient examined, no change in status, stable for surgery.  I have reviewed the patient's chart and labs.  Questions were answered to the patient's satisfaction.     Velna Ochs

## 2020-11-12 NOTE — Transfer of Care (Signed)
Immediate Anesthesia Transfer of Care Note  Patient: [name]  Procedure(s) Performed: LEFT TOTAL HIP ARTHROPLASTY ANTERIOR APPROACH (Left Hip)  Patient Location: PACU  Anesthesia Type:Spinal  Level of Consciousness: drowsy  Airway & Oxygen Therapy: Patient Spontanous Breathing and Patient connected to face mask  Post-op Assessment: Report given to RN and Post -op Vital signs reviewed and stable  Post vital signs: Reviewed and stable  Last Vitals:  Vitals Value Taken Time  BP 91/56 11/12/20 1200  Temp    Pulse 60 11/12/20 1202  Resp 15 11/12/20 1202  SpO2 100 % 11/12/20 1202  Vitals shown include unvalidated device data.  Last Pain:  Vitals:   11/12/20 0806  TempSrc: Oral  PainSc:       Patients Stated Pain Goal: 6 (11/12/20 0757)  Complications: No complications documented.

## 2020-11-12 NOTE — Evaluation (Signed)
Physical Therapy Evaluation Patient Details Name: [name] MRN: [id number] DOB: Nov 12, 1940 Today's Date: 11/12/2020   History of Present Illness  Patient is 80 y.o. female s/p Lt THA anterior approach on 11/12/20 with PMH significant for TIA's, seizure, MI, HTN, HLD, GERD, DM, CAD, back pain.    Clinical Impression  [name] is a 80 y.o. female POD 0 s/p Lt THA. Patient reports independence with mobility at baseline. Patient is now limited by functional impairments (see PT problem list below) and requires min-mod assist for bed mobility and transfers with RW. Patient was able to move bed<>BSC to void with Mod assist for sequencing and to steady. Further gait deferred due to pain. Patient will benefit from continued skilled PT interventions to address impairments and progress towards PLOF. Acute PT will follow to progress mobility and stair training in preparation for safe discharge home.     Follow Up Recommendations Follow surgeon's recommendation for DC plan and follow-up therapies    Equipment Recommendations  None recommended by PT    Recommendations for Other Services       Precautions / Restrictions Precautions Precautions: Fall Restrictions Weight Bearing Restrictions: No Other Position/Activity Restrictions: WBAT      Mobility  Bed Mobility Overal bed mobility: Needs Assistance Bed Mobility: Supine to Sit;Sit to Supine     Supine to sit: Mod assist;HOB elevated Sit to supine: Min assist;Mod assist   General bed mobility comments: Mod assist for bringing bil LE's off EOB and raising trunk upright due to pain limitations. EOS pt returned to supine with min assist for lowering trunk and mod assist to raise bil LE's into bed.    Transfers Overall transfer level: Needs assistance Equipment used: Rolling walker (2 wheeled) Transfers: Sit to/from [employer] Sit to Stand: Min assist;From elevated surface Stand pivot transfers: Min assist;From  elevated surface       General transfer comment: Min cues and assist for safe hand placement/technique using RW. Cues/assist for managing RW position with stand step/pivot transfer bed<>BSC.  Ambulation/Gait                Stairs            Wheelchair Mobility    Modified Rankin (Stroke Patients Only)       Balance Overall balance assessment: Needs assistance Sitting-balance support: Feet supported;Bilateral upper extremity supported Sitting balance-Leahy Scale: Fair     Standing balance support: During functional activity;Bilateral upper extremity supported Standing balance-Leahy Scale: Poor                               Pertinent Vitals/Pain Pain Assessment: Faces Faces Pain Scale: Hurts little more Pain Location: Lt hip Pain Descriptors / Indicators: Aching;Discomfort;Sore Pain Intervention(s): Limited activity within patient's tolerance;Monitored during session;Repositioned    Home Living Family/patient expects to be discharged to:: Private residence Living Arrangements: Alone Available Help at Discharge: Family Type of Home: House Home Access: Level entry     Home Layout: One level Home Equipment: Environmental consultant - 2 wheels Additional Comments: family lives close by    Prior Function Level of Independence: Independent               Hand Dominance        Extremity/Trunk Assessment   Upper Extremity Assessment Upper Extremity Assessment: Overall WFL for tasks assessed    Lower Extremity Assessment Lower Extremity Assessment: Overall WFL for tasks assessed  Cervical / Trunk Assessment Cervical / Trunk Assessment: Normal  Communication   Communication: No difficulties  Cognition Arousal/Alertness: Awake/alert Behavior During Therapy: WFL for tasks assessed/performed Overall Cognitive Status: Within Functional Limits for tasks assessed                                        General Comments       Exercises     Assessment/Plan    PT Assessment Patient needs continued PT services  PT Problem List Decreased strength;Decreased range of motion;Decreased activity tolerance;Decreased balance;Decreased mobility;Decreased knowledge of use of DME;Decreased knowledge of precautions;Pain       PT Treatment Interventions DME instruction;Gait training;Stair training;Therapeutic activities;Functional mobility training;Therapeutic exercise;Balance training;Patient/family education    PT Goals (Current goals can be found in the Care Plan section)  Acute Rehab PT Goals Patient Stated Goal: stop hurting and get recovered PT Goal Formulation: With patient Time For Goal Achievement: 11/19/20 Potential to Achieve Goals: Good    Frequency 7X/week   Barriers to discharge        Co-evaluation               AM-PAC PT "6 Clicks" Mobility  Outcome Measure Help needed turning from your back to your side while in a flat bed without using bedrails?: A Little Help needed moving from lying on your back to sitting on the side of a flat bed without using bedrails?: A Lot Help needed moving to and from a bed to a chair (including a wheelchair)?: A Lot Help needed standing up from a chair using your arms (e.g., wheelchair or bedside chair)?: A Little Help needed to walk in hospital room?: A Lot Help needed climbing 3-5 steps with a railing? : A Lot 6 Click Score: 14    End of Session Equipment Utilized During Treatment: Gait belt Activity Tolerance: Patient tolerated treatment well Patient left: in bed;with call bell/phone within reach Nurse Communication: Mobility status PT Visit Diagnosis: Muscle weakness (generalized) (M62.81);Difficulty in walking, not elsewhere classified (R26.2)    Time: 6387-5643 PT Time Calculation (min) (ACUTE ONLY): 21 min   Charges:   PT Evaluation $PT Eval Low Complexity: 1 Low          Wynn Maudlin, DPT Acute Rehabilitation Services Office  [phone] Pager [phone]    Anitra Lauth 11/12/2020, 6:11 PM

## 2020-11-12 NOTE — Anesthesia Preprocedure Evaluation (Signed)
Anesthesia Evaluation  Patient identified by MRN, date of birth, ID band Patient awake    Reviewed: Allergy & Precautions, H&P , NPO status , Patient's Chart, lab work & pertinent test results  History of Anesthesia Complications (+) Family history of anesthesia reaction  Airway Mallampati: II  TM Distance: >3 FB Neck ROM: Full    Dental no notable dental hx. (+) Teeth Intact, Missing, Partial Upper,    Pulmonary neg pulmonary ROS,    Pulmonary exam normal breath sounds clear to auscultation       Cardiovascular Exercise Tolerance: Good hypertension, Pt. on medications + CAD, + Past MI and + Cardiac Stents  Normal cardiovascular exam Rhythm:Regular Rate:Normal     Neuro/Psych  Headaches, TIACVA, No Residual Symptoms negative neurological ROS  negative psych ROS   GI/Hepatic negative GI ROS, Neg liver ROS, hiatal hernia, GERD  Medicated,  Endo/Other  negative endocrine ROSdiabetes, Type 2  Renal/GU negative Renal ROS  negative genitourinary   Musculoskeletal  (+) Arthritis , Osteoarthritis,    Abdominal   Peds negative pediatric ROS (+)  Hematology negative hematology ROS (+)   Anesthesia Other Findings   Reproductive/Obstetrics negative OB ROS                             Anesthesia Physical  Anesthesia Plan  ASA: III  Anesthesia Plan: Spinal   Post-op Pain Management:    Induction: Intravenous  PONV Risk Score and Plan: 2 and Ondansetron, Treatment may vary due to age or medical condition and Midazolam  Airway Management Planned: Simple Face Mask  Additional Equipment:   Intra-op Plan:   Post-operative Plan:   Informed Consent: I have reviewed the patients History and Physical, chart, labs and discussed the procedure including the risks, benefits and alternatives for the proposed anesthesia with the patient or authorized representative who has indicated his/her  understanding and acceptance.       Plan Discussed with: Anesthesiologist and CRNA  Anesthesia Plan Comments: (Follows with hx of CAD/MI s/p TAXUS stent to the LAD in 2005, and DES to the RCA in February 2008. Followed by cardiology at [facility] Dr. Chales Abrahams. She was seen 10/03/19 to establish care (recently moved to the area). She reported some chest discomfort with exertion and nuclear stress and event monitor were ordered. She was also placed on Plavix in addition to her ASA (per note, additon of Plavix was also due to somewhat unclear history of possible TIAs). Nuclear stress was low risk, nonischemic. Monitor showed predominantly NSR.   )        Anesthesia Quick Evaluation

## 2020-11-12 NOTE — Anesthesia Postprocedure Evaluation (Signed)
Anesthesia Post Note  Patient: [name]  Procedure(s) Performed: LEFT TOTAL HIP ARTHROPLASTY ANTERIOR APPROACH (Left Hip)     Patient location during evaluation: PACU Anesthesia Type: Spinal Level of consciousness: awake and alert Pain management: pain level controlled Vital Signs Assessment: post-procedure vital signs reviewed and stable Respiratory status: spontaneous breathing, nonlabored ventilation and respiratory function stable Cardiovascular status: blood pressure returned to baseline and stable Postop Assessment: no apparent nausea or vomiting Anesthetic complications: no   No complications documented.  Last Vitals:  Vitals:   11/12/20 1300 11/12/20 1315  BP: (!) 157/84 (!) 162/75  Pulse: (!) 57 (!) 59  Resp: 10 12  Temp: (!) 36.4 C   SpO2: 99% 99%    Last Pain:  Vitals:   11/12/20 1315  TempSrc:   PainSc: 0-No pain                 Lowella Curb

## 2020-11-13 ENCOUNTER — Encounter (HOSPITAL_COMMUNITY): Payer: Self-pay | Admitting: Orthopaedic Surgery

## 2020-11-13 ENCOUNTER — Other Ambulatory Visit: Payer: Self-pay

## 2020-11-13 DIAGNOSIS — M1612 Unilateral primary osteoarthritis, left hip: Secondary | ICD-10-CM | POA: Diagnosis not present

## 2020-11-13 LAB — GLUCOSE, CAPILLARY: Glucose-Capillary: 193 mg/dL — ABNORMAL HIGH (ref 70–99)

## 2020-11-13 NOTE — TOC Transition Note (Addendum)
Transition of Care [facility]) - CM/SW Discharge Note  Patient Details  Name: MIKU UDALL MRN: [id number] Date of Birth: Feb 03, 1941  [facility] CM/SW Contact:  Ewing Schlein, LCSW Phone Number: 11/13/2020, 12:26 PM  Clinical Narrative: Patient is expected to discharge home after working with PT. Patient has a rolling walker and shower chair at home, so there are no DME needs at this time. PT recommendation is HHPT. Patient was originally accepted by [facility] for HHPT, but her [facility] plan is not in-network. CSW made referrals to the following [facility] agencies:  [facility]: out-of-network [facility] declined without a pair [facility]: out-of-network [facility]: out-of-network  CSW called [facility] to get assistance with care management through the patient's [employer] plan. CSW referred to [facility] and [facility] providers. [facility] does not provide HHPT. CSW made referral to [facility] with [facility]. [facility] to pull HH orders from Epic. [facility] added to AVS. CSW updated patient. TOC signing off.  Final next level of care: Home w Home Health Services Barriers to Discharge: Barriers Resolved  Patient Goals and CMS Choice Patient states their goals for this hospitalization and ongoing recovery are:: Discharge home with [facility] CMS Medicare.gov Compare Post Acute Care list provided to:: Patient Choice offered to / list presented to : Patient  Discharge Plan and Services        DME Arranged: N/A DME Agency: NA HH Arranged: PT HH Agency: [facility] Date HH Agency Contacted: 11/13/20 Time HH Agency Contacted: 1204 Representative spoke with at [facility] Agency: Alcario Drought  Readmission Risk Interventions No flowsheet data found.

## 2020-11-13 NOTE — Progress Notes (Signed)
Physical Therapy Treatment Patient Details Name: [name] MRN: [id number] DOB: 02-17-1941 Today's Date: 11/13/2020    History of Present Illness Patient is 80 y.o. female s/p Lt THA anterior approach on 11/12/20 with PMH significant for TIA's, seizure, MI, HTN, HLD, GERD, DM, CAD, back pain.    PT Comments    POD # 1 pm session Assisted OOB required increased time and effort.  Assisted with amb.  General transfer comment: 25% VC's on proper hand placement and increased time to self rise. pain 7/10 slight improvement. General Gait Details: 25% VC's on proper walker placement and sequencing.  Tolerated an increased distance however limited weight shift onto L LE and great difficulty advancing L LE.  Unstaedy.  Max effort with c/o fatigue post. General stair comments: pt has one step to enter her home.  Attempted and completed but with much effort and instability.  Great difficulty weightbearing L LE due to increased pain. Not safe. Pt has NOT met mobility goals to safely D/C to home today.  Reported to RN.   Follow Up Recommendations  Follow surgeon's recommendation for DC plan and follow-up therapies     Equipment Recommendations  None recommended by PT    Recommendations for Other Services       Precautions / Restrictions Precautions Precautions: Fall Restrictions Weight Bearing Restrictions: No LLE Weight Bearing: Weight bearing as tolerated    Mobility  Bed Mobility Overal bed mobility: Needs Assistance Bed Mobility: Supine to Sit     Supine to sit: Min assist     General bed mobility comments: demonstrated and instructed on how to use belt to self assist LE.  Required increased time x 2 to transition to EOB. slight improvement from this morning    Transfers Overall transfer level: Needs assistance Equipment used: Rolling walker (2 wheeled) Transfers: Sit to/from [facility] Sit to Stand: Min assist;From elevated surface Stand pivot transfers:  Min assist;From elevated surface       General transfer comment: 25% VC's on proper hand placement and increased time to self rise. pain 7/10 slight improvement.  Ambulation/Gait Ambulation/Gait assistance: Min assist Gait Distance (Feet): 14 Feet Assistive device: Rolling walker (2 wheeled) Gait Pattern/deviations: Step-to pattern;Decreased stance time - left Gait velocity: decreased   General Gait Details: 25% VC's on proper walker placement and sequencing.  Tolerated an increased distance however limited weight shift onto L LE and great difficulty advancing L LE.  Unstaedy.  Max effort with c/o fatigue post.   Stairs Stairs: Yes Stairs assistance: Mod assist Stair Management: No rails;Step to pattern;Forwards;With walker Number of Stairs: 1 General stair comments: pt has one step to enter her home.  Attempted and completed but with much effort and instability.  Great difficulty weightbearing L LE due to increased pain. Not safe.   Wheelchair Mobility    Modified Rankin (Stroke Patients Only)       Balance                                            Cognition Arousal/Alertness: Awake/alert Behavior During Therapy: WFL for tasks assessed/performed Overall Cognitive Status: Within Functional Limits for tasks assessed                                 General Comments: AxO x 3 very pleasant  independent lady      Exercises      General Comments        Pertinent Vitals/Pain Pain Assessment: 0-10 Pain Score: 7  Faces Pain Scale: Hurts worst Pain Location: L hip Pain Descriptors / Indicators: Aching;Discomfort;Sore;Operative site guarding Pain Intervention(s): Monitored during session;Premedicated before session;Repositioned;Ice applied    Home Living                      Prior Function            PT Goals (current goals can now be found in the care plan section) Progress towards PT goals: Progressing toward goals     Frequency    7X/week      PT Plan Current plan remains appropriate    Co-evaluation              AM-PAC PT "6 Clicks" Mobility   Outcome Measure  Help needed turning from your back to your side while in a flat bed without using bedrails?: A Little Help needed moving from lying on your back to sitting on the side of a flat bed without using bedrails?: A Little Help needed moving to and from a bed to a chair (including a wheelchair)?: A Little Help needed standing up from a chair using your arms (e.g., wheelchair or bedside chair)?: A Little Help needed to walk in hospital room?: A Little Help needed climbing 3-5 steps with a railing? : A Lot 6 Click Score: 17    End of Session Equipment Utilized During Treatment: Gait belt Activity Tolerance: Patient tolerated treatment well Patient left: in chair;with call bell/phone within reach;with chair alarm set Nurse Communication: Mobility status (pt's mobility is unsafe to D/C to home today) PT Visit Diagnosis: Muscle weakness (generalized) (M62.81);Difficulty in walking, not elsewhere classified (R26.2)     Time: 4401-0272 PT Time Calculation (min) (ACUTE ONLY): 30 min  Charges:  $Gait Training: 8-22 mins $Therapeutic Activity: 8-22 mins                     Rica Koyanagi  PTA Acute  Rehabilitation Services Pager      [phone] Office      [phone]

## 2020-11-13 NOTE — Discharge Summary (Deleted)
Patient ID: [name] MRN: [id number] DOB/AGE: 1940-08-04 80 y.o.  Admit date: 11/12/2020 Discharge date: 11/14/2020  Admission Diagnoses:  Principal Problem:   Primary osteoarthritis of left hip   Discharge Diagnoses:  Same  Past Medical History:  Diagnosis Date  . Arthritis    lower back  . Chronic back pain    S/P MRI [id number] large left paracentral disc herniation  . Coronary artery disease   . Dizzy spells 04/13/2019   after a fall. dizzy when rolling over in bed or moving quickly  . DM2 (diabetes mellitus, type 2) (HCC)   . Dyspnea   . Family history of adverse reaction to anesthesia    Sister had PONV  . GERD (gastroesophageal reflux disease)   . Headache(784.0)    Carotid US/ Negative/[facility]  . Hiatal hernia   . Hyperlipidemia   . Hypertension   . Insomnia   . MI (myocardial infarction) (HCC) 2005   P stent x3 in 2008  . Numbness and tingling    left foot after back surgery  . Seizure (HCC)   . Stroke [facility])    "Mini strokes"  . TIA (transient ischemic attack) 2013   08-25-2011, 09-06-2011  . Vascular migraine     Surgeries: Procedure(s): LEFT TOTAL HIP ARTHROPLASTY ANTERIOR APPROACH on 11/12/2020   Consultants:   Discharged Condition: Improved  Hospital Course: [name] is an 80 y.o. female who was admitted 11/12/2020 for operative treatment ofPrimary osteoarthritis of left hip. Patient has severe unremitting pain that affects sleep, daily activities, and work/hobbies. After pre-op clearance the patient was taken to the operating room on 11/12/2020 and underwent  Procedure(s): LEFT TOTAL HIP ARTHROPLASTY ANTERIOR APPROACH.    Patient was given perioperative antibiotics:  Anti-infectives (From admission, onward)   Start     Dose/Rate Route Frequency Ordered Stop   11/12/20 1630  ceFAZolin (ANCEF) IVPB 2g/100 mL premix        2 g 200 mL/hr over 30 Minutes Intravenous Every 6 hours 11/12/20 1207 11/13/20 0200   11/12/20 0745   ceFAZolin (ANCEF) IVPB 2g/100 mL premix        2 g 200 mL/hr over 30 Minutes Intravenous On call to O.R. 11/12/20 0740 11/12/20 1028   11/12/20 0745  ceFAZolin (ANCEF) IVPB 2g/100 mL premix  Status:  Discontinued        2 g 200 mL/hr over 30 Minutes Intravenous On call to O.R. 11/12/20 0740 11/12/20 4098       Patient was given sequential compression devices, early ambulation, and chemoprophylaxis to prevent DVT.  Patient benefited maximally from hospital stay and there were no complications.    Recent vital signs:  Patient Vitals for the past 24 hrs:  BP Temp Temp src Pulse Resp SpO2  11/13/20 0523 135/82 98.9 F (37.2 C) Oral 75 15 100 %  11/13/20 0105 (!) 122/58 98.4 F (36.9 C) Oral 79 16 97 %  11/12/20 2020 (!) 157/83 98.9 F (37.2 C) Oral 79 18 99 %  11/12/20 1912 (!) 170/90 99.5 F (37.5 C) Oral 75 16 100 %  11/12/20 1700 (!) 160/73 97.9 F (36.6 C) -- 72 16 100 %  11/12/20 1600 133/80 97.8 F (36.6 C) -- 81 20 95 %  11/12/20 1500 129/68 97.9 F (36.6 C) -- 70 15 99 %  11/12/20 1415 -- -- -- 72 17 100 %  11/12/20 1345 (!) 155/80 97.6 F (36.4 C) -- 64 18 100 %  11/12/20 1330 -- 97.6 F (  36.4 C) -- 67 10 100 %  11/12/20 1315 (!) 162/75 -- -- (!) 59 12 99 %  11/12/20 1300 (!) 157/84 (!) 97.5 F (36.4 C) -- (!) 57 10 99 %  11/12/20 1245 (!) 152/81 97.6 F (36.4 C) -- (!) 59 13 100 %  11/12/20 1230 (!) 152/81 -- -- 64 17 100 %  11/12/20 1215 117/66 -- -- (!) 59 16 100 %  11/12/20 1200 (!) 91/56 98.2 F (36.8 C) -- (!) 59 16 100 %     Recent laboratory studies: No results for input(s): WBC, HGB, HCT, PLT, NA, K, CL, CO2, BUN, CREATININE, GLUCOSE, INR, CALCIUM in the last 72 hours.  Invalid input(s): PT, 2   Discharge Medications:   Allergies as of 11/13/2020      Reactions   Escitalopram Oxalate    Pt unsure of reaction   Byetta 5 Mcg Pen [exenatide] Nausea Only   Codeine Sulfate Other (See Comments)   Unknown    Metoprolol Tartrate Cough   Lipitor  [atorvastatin] Palpitations   Felt like heart attack      Medication List    TAKE these medications   aspirin EC 81 MG tablet Take 81 mg by mouth at bedtime.   carvedilol 25 MG tablet Commonly known as: COREG Take 25 mg by mouth 2 (two) times daily with a meal.   glimepiride 4 MG tablet Commonly known as: AMARYL Take 4 mg 2 (two) times daily by mouth.   HYDROcodone-acetaminophen 5-325 MG tablet Commonly known as: NORCO/VICODIN Take 1-2 tablets by mouth every 6 (six) hours as needed for moderate pain or severe pain (post op pain). What changed: reasons to take this   levETIRAcetam 500 MG tablet Commonly known as: KEPPRA Take 500 mg by mouth in the morning and at bedtime.   metFORMIN 500 MG tablet Commonly known as: GLUCOPHAGE Take 500 mg by mouth in the morning, at noon, and at bedtime.   pantoprazole 40 MG tablet Commonly known as: PROTONIX Take 1 tablet (40 mg total) by mouth daily. Take 1 tab twice a day. What changed:   when to take this  additional instructions   rosuvastatin 5 MG tablet Commonly known as: CRESTOR Take 5 mg by mouth in the morning and at bedtime.   tiZANidine 4 MG tablet Commonly known as: Zanaflex Take 1 tablet (4 mg total) by mouth every 6 (six) hours as needed for muscle spasms.            Durable Medical Equipment  (From admission, onward)         Start     Ordered   11/12/20 1830  DME Walker rolling  Once       Question:  Patient needs a walker to treat with the following condition  Answer:  Primary osteoarthritis of left hip   11/12/20 1829   11/12/20 1830  DME 3 n 1  Once        11/12/20 1829   11/12/20 1830  DME Bedside commode  Once       Question:  Patient needs a bedside commode to treat with the following condition  Answer:  Primary osteoarthritis of left hip   11/12/20 1829          Diagnostic Studies: DG C-Arm 1-60 Min-No Report  Result Date: 11/12/2020 Fluoroscopy was utilized by the requesting physician.  No  radiographic interpretation.   DG HIP OPERATIVE UNILAT W OR W/O PELVIS LEFT  Result Date: 11/12/2020 CLINICAL DATA:  Left hip replacement EXAM: OPERATIVE LEFT HIP WITH PELVIS COMPARISON:  06/26/2020 FLUOROSCOPY TIME:  Radiation Exposure Index (as provided by the fluoroscopic device): 2.52 mGy If the device does not provide the exposure index: Fluoroscopy Time:  18 seconds Number of Acquired Images:  2 FINDINGS: Left hip prosthesis is noted in satisfactory position. No acute bony or soft tissue abnormality is noted. IMPRESSION: Status post left hip prosthesis. Electronically Signed   By: Alcide Clever M.D.   On: 11/12/2020 12:01    Disposition: Discharge disposition: 01-Home or Self Care       Discharge Instructions    Call MD / Call 911   Complete by: As directed    If you experience chest pain or shortness of breath, CALL 911 and be transported to the hospital emergency room.  If you develope a fever above 101 F, pus (white drainage) or increased drainage or redness at the wound, or calf pain, call your surgeon's office.   Call MD / Call 911   Complete by: As directed    If you experience chest pain or shortness of breath, CALL 911 and be transported to the hospital emergency room.  If you develope a fever above 101 F, pus (white drainage) or increased drainage or redness at the wound, or calf pain, call your surgeon's office.   Constipation Prevention   Complete by: As directed    Drink plenty of fluids.  Prune juice may be helpful.  You may use a stool softener, such as Colace (over the counter) 100 mg twice a day.  Use MiraLax (over the counter) for constipation as needed.   Constipation Prevention   Complete by: As directed    Drink plenty of fluids.  Prune juice may be helpful.  You may use a stool softener, such as Colace (over the counter) 100 mg twice a day.  Use MiraLax (over the counter) for constipation as needed.   Diet - low sodium heart healthy   Complete by: As directed     Diet - low sodium heart healthy   Complete by: As directed    Discharge instructions   Complete by: As directed    INSTRUCTIONS AFTER JOINT REPLACEMENT   Remove items at home which could result in a fall. This includes throw rugs or furniture in walking pathways ICE to the affected joint every three hours while awake for 30 minutes at a time, for at least the first 3-5 days, and then as needed for pain and swelling.  Continue to use ice for pain and swelling. You may notice swelling that will progress down to the foot and ankle.  This is normal after surgery.  Elevate your leg when you are not up walking on it.   Continue to use the breathing machine you got in the hospital (incentive spirometer) which will help keep your temperature down.  It is common for your temperature to cycle up and down following surgery, especially at night when you are not up moving around and exerting yourself.  The breathing machine keeps your lungs expanded and your temperature down.   DIET:  As you were doing prior to hospitalization, we recommend a well-balanced diet.  DRESSING / WOUND CARE / SHOWERING  You may shower 3 days after surgery, but keep the wounds dry during showering.  You may use an occlusive plastic wrap (Press'n Seal for example), NO SOAKING/SUBMERGING IN THE BATHTUB.  If the bandage gets wet, change with a clean dry gauze.  If the  incision gets wet, pat the wound dry with a clean towel.  ACTIVITY  Increase activity slowly as tolerated, but follow the weight bearing instructions below.   No driving for 6 weeks or until further direction given by your physician.  You cannot drive while taking narcotics.  No lifting or carrying greater than 10 lbs. until further directed by your surgeon. Avoid periods of inactivity such as sitting longer than an hour when not asleep. This helps prevent blood clots.  You may return to work once you are authorized by your doctor.     WEIGHT BEARING   Weight  bearing as tolerated with assist device (walker, cane, etc) as directed, use it as long as suggested by your surgeon or therapist, typically at least 4-6 weeks.   EXERCISES  Results after joint replacement surgery are often greatly improved when you follow the exercise, range of motion and muscle strengthening exercises prescribed by your doctor. Safety measures are also important to protect the joint from further injury. Any time any of these exercises cause you to have increased pain or swelling, decrease what you are doing until you are comfortable again and then slowly increase them. If you have problems or questions, call your caregiver or physical therapist for advice.   Rehabilitation is important following a joint replacement. After just a few days of immobilization, the muscles of the leg can become weakened and shrink (atrophy).  These exercises are designed to build up the tone and strength of the thigh and leg muscles and to improve motion. Often times heat used for twenty to thirty minutes before working out will loosen up your tissues and help with improving the range of motion but do not use heat for the first two weeks following surgery (sometimes heat can increase post-operative swelling).   These exercises can be done on a training (exercise) mat, on the floor, on a table or on a bed. Use whatever works the best and is most comfortable for you.    Use music or television while you are exercising so that the exercises are a pleasant break in your day. This will make your life better with the exercises acting as a break in your routine that you can look forward to.   Perform all exercises about fifteen times, three times per day or as directed.  You should exercise both the operative leg and the other leg as well.  Exercises include:   Quad Sets - Tighten up the muscle on the front of the thigh (Quad) and hold for 5-10 seconds.   Straight Leg Raises - With your knee straight (if you  were given a brace, keep it on), lift the leg to 60 degrees, hold for 3 seconds, and slowly lower the leg.  Perform this exercise against resistance later as your leg gets stronger.  Leg Slides: Lying on your back, slowly slide your foot toward your buttocks, bending your knee up off the floor (only go as far as is comfortable). Then slowly slide your foot back down until your leg is flat on the floor again.  Angel Wings: Lying on your back spread your legs to the side as far apart as you can without causing discomfort.  Hamstring Strength:  Lying on your back, push your heel against the floor with your leg straight by tightening up the muscles of your buttocks.  Repeat, but this time bend your knee to a comfortable angle, and push your heel against the floor.  You may put  a pillow under the heel to make it more comfortable if necessary.   A rehabilitation program following joint replacement surgery can speed recovery and prevent re-injury in the future due to weakened muscles. Contact your doctor or a physical therapist for more information on knee rehabilitation.    CONSTIPATION  Constipation is defined medically as fewer than three stools per week and severe constipation as less than one stool per week.  Even if you have a regular bowel pattern at home, your normal regimen is likely to be disrupted due to multiple reasons following surgery.  Combination of anesthesia, postoperative narcotics, change in appetite and fluid intake all can affect your bowels.   YOU MUST use at least one of the following options; they are listed in order of increasing strength to get the job done.  They are all available over the counter, and you may need to use some, POSSIBLY even all of these options:    Drink plenty of fluids (prune juice may be helpful) and high fiber foods Colace 100 mg by mouth twice a day  Senokot for constipation as directed and as needed Dulcolax (bisacodyl), take with full glass of water   Miralax (polyethylene glycol) once or twice a day as needed.  If you have tried all these things and are unable to have a bowel movement in the first 3-4 days after surgery call either your surgeon or your primary doctor.    If you experience loose stools or diarrhea, hold the medications until you stool forms back up.  If your symptoms do not get better within 1 week or if they get worse, check with your doctor.  If you experience "the worst abdominal pain ever" or develop nausea or vomiting, please contact the office immediately for further recommendations for treatment.   ITCHING:  If you experience itching with your medications, try taking only a single pain pill, or even half a pain pill at a time.  You can also use Benadryl over the counter for itching or also to help with sleep.   TED HOSE STOCKINGS:  Use stockings on both legs until for at least 2 weeks or as directed by physician office. They may be removed at night for sleeping.  MEDICATIONS:  See your medication summary on the "After Visit Summary" that nursing will review with you.  You may have some home medications which will be placed on hold until you complete the course of blood thinner medication.  It is important for you to complete the blood thinner medication as prescribed.  PRECAUTIONS:  If you experience chest pain or shortness of breath - call 911 immediately for transfer to the hospital emergency department.   If you develop a fever greater that 101 F, purulent drainage from wound, increased redness or drainage from wound, foul odor from the wound/dressing, or calf pain - CONTACT YOUR SURGEON.                                                   FOLLOW-UP APPOINTMENTS:  If you do not already have a post-op appointment, please call the office for an appointment to be seen by your surgeon.  Guidelines for how soon to be seen are listed in your "After Visit Summary", but are typically between 1-4 weeks after surgery.  OTHER  INSTRUCTIONS:   Knee Replacement:  Do not place pillow under knee, focus on keeping the knee straight while resting. CPM instructions: 0-90 degrees, 2 hours in the morning, 2 hours in the afternoon, and 2 hours in the evening. Place foam block, curve side up under heel at all times except when in CPM or when walking.  DO NOT modify, tear, cut, or change the foam block in any way.  POST-OPERATIVE OPIOID TAPER INSTRUCTIONS: It is important to wean off of your opioid medication as soon as possible. If you do not need pain medication after your surgery it is ok to stop day one. Opioids include: Codeine, Hydrocodone(Norco, Vicodin), Oxycodone(Percocet, oxycontin) and hydromorphone amongst others.  Long term and even short term use of opiods can cause: Increased pain response Dependence Constipation Depression Respiratory depression And more.  Withdrawal symptoms can include Flu like symptoms Nausea, vomiting And more Techniques to manage these symptoms Hydrate well Eat regular healthy meals Stay active Use relaxation techniques(deep breathing, meditating, yoga) Do Not substitute Alcohol to help with tapering If you have been on opioids for less than two weeks and do not have pain than it is ok to stop all together.  Plan to wean off of opioids This plan should start within one week post op of your joint replacement. Maintain the same interval or time between taking each dose and first decrease the dose.  Cut the total daily intake of opioids by one tablet each day Next start to increase the time between doses. The last dose that should be eliminated is the evening dose.     MAKE SURE YOU:  Understand these instructions.  Get help right away if you are not doing well or get worse.    Thank you for letting us be a part of your medical care team.  It is a privilege we respect greatly.  We hope these instructions will help you stay on track for a fast and full recovery!   Discharge  instructions   Complete by: As directed    INSTRUCTIONS AFTER JOINT REPLACEMENT   Remove items at home which could result in a fall. This includes throw rugs or furniture in walking pathways ICE to the affected joint every three hours while awake for 30 minutes at a time, for at least the first 3-5 days, and then as needed for pain and swelling.  Continue to use ice for pain and swelling. You may notice swelling that will progress down to the foot and ankle.  This is normal after surgery.  Elevate your leg when you are not up walking on it.   Continue to use the breathing machine you got in the hospital (incentive spirometer) which will help keep your temperature down.  It is common for your temperature to cycle up and down following surgery, especially at night when you are not up moving around and exerting yourself.  The breathing machine keeps your lungs expanded and your temperature down.   DIET:  As you were doing prior to hospitalization, we recommend a well-balanced diet.  DRESSING / WOUND CARE / SHOWERING  You may shower 3 days after surgery, but keep the wounds dry during showering.  You may use an occlusive plastic wrap (Press'n Seal for example), NO SOAKING/SUBMERGING IN THE BATHTUB.  If the bandage gets wet, change with a clean dry gauze.  If the incision gets wet, pat the wound dry with a clean towel.  ACTIVITY  Increase activity slowly as tolerated, but follow the weight bearing instructions below.  No driving for 6 weeks or until further direction given by your physician.  You cannot drive while taking narcotics.  No lifting or carrying greater than 10 lbs. until further directed by your surgeon. Avoid periods of inactivity such as sitting longer than an hour when not asleep. This helps prevent blood clots.  You may return to work once you are authorized by your doctor.     WEIGHT BEARING   Weight bearing as tolerated with assist device (walker, cane, etc) as directed, use  it as long as suggested by your surgeon or therapist, typically at least 4-6 weeks.   EXERCISES  Results after joint replacement surgery are often greatly improved when you follow the exercise, range of motion and muscle strengthening exercises prescribed by your doctor. Safety measures are also important to protect the joint from further injury. Any time any of these exercises cause you to have increased pain or swelling, decrease what you are doing until you are comfortable again and then slowly increase them. If you have problems or questions, call your caregiver or physical therapist for advice.   Rehabilitation is important following a joint replacement. After just a few days of immobilization, the muscles of the leg can become weakened and shrink (atrophy).  These exercises are designed to build up the tone and strength of the thigh and leg muscles and to improve motion. Often times heat used for twenty to thirty minutes before working out will loosen up your tissues and help with improving the range of motion but do not use heat for the first two weeks following surgery (sometimes heat can increase post-operative swelling).   These exercises can be done on a training (exercise) mat, on the floor, on a table or on a bed. Use whatever works the best and is most comfortable for you.    Use music or television while you are exercising so that the exercises are a pleasant break in your day. This will make your life better with the exercises acting as a break in your routine that you can look forward to.   Perform all exercises about fifteen times, three times per day or as directed.  You should exercise both the operative leg and the other leg as well.  Exercises include:   Quad Sets - Tighten up the muscle on the front of the thigh (Quad) and hold for 5-10 seconds.   Straight Leg Raises - With your knee straight (if you were given a brace, keep it on), lift the leg to 60 degrees, hold for 3 seconds,  and slowly lower the leg.  Perform this exercise against resistance later as your leg gets stronger.  Leg Slides: Lying on your back, slowly slide your foot toward your buttocks, bending your knee up off the floor (only go as far as is comfortable). Then slowly slide your foot back down until your leg is flat on the floor again.  Angel Wings: Lying on your back spread your legs to the side as far apart as you can without causing discomfort.  Hamstring Strength:  Lying on your back, push your heel against the floor with your leg straight by tightening up the muscles of your buttocks.  Repeat, but this time bend your knee to a comfortable angle, and push your heel against the floor.  You may put a pillow under the heel to make it more comfortable if necessary.   A rehabilitation program following joint replacement surgery can speed recovery and prevent re-injury in  the future due to weakened muscles. Contact your doctor or a physical therapist for more information on knee rehabilitation.    CONSTIPATION  Constipation is defined medically as fewer than three stools per week and severe constipation as less than one stool per week.  Even if you have a regular bowel pattern at home, your normal regimen is likely to be disrupted due to multiple reasons following surgery.  Combination of anesthesia, postoperative narcotics, change in appetite and fluid intake all can affect your bowels.   YOU MUST use at least one of the following options; they are listed in order of increasing strength to get the job done.  They are all available over the counter, and you may need to use some, POSSIBLY even all of these options:    Drink plenty of fluids (prune juice may be helpful) and high fiber foods Colace 100 mg by mouth twice a day  Senokot for constipation as directed and as needed Dulcolax (bisacodyl), take with full glass of water  Miralax (polyethylene glycol) once or twice a day as needed.  If you have tried  all these things and are unable to have a bowel movement in the first 3-4 days after surgery call either your surgeon or your primary doctor.    If you experience loose stools or diarrhea, hold the medications until you stool forms back up.  If your symptoms do not get better within 1 week or if they get worse, check with your doctor.  If you experience "the worst abdominal pain ever" or develop nausea or vomiting, please contact the office immediately for further recommendations for treatment.   ITCHING:  If you experience itching with your medications, try taking only a single pain pill, or even half a pain pill at a time.  You can also use Benadryl over the counter for itching or also to help with sleep.   TED HOSE STOCKINGS:  Use stockings on both legs until for at least 2 weeks or as directed by physician office. They may be removed at night for sleeping.  MEDICATIONS:  See your medication summary on the "After Visit Summary" that nursing will review with you.  You may have some home medications which will be placed on hold until you complete the course of blood thinner medication.  It is important for you to complete the blood thinner medication as prescribed.  PRECAUTIONS:  If you experience chest pain or shortness of breath - call 911 immediately for transfer to the hospital emergency department.   If you develop a fever greater that 101 F, purulent drainage from wound, increased redness or drainage from wound, foul odor from the wound/dressing, or calf pain - CONTACT YOUR SURGEON.                                                   FOLLOW-UP APPOINTMENTS:  If you do not already have a post-op appointment, please call the office for an appointment to be seen by your surgeon.  Guidelines for how soon to be seen are listed in your "After Visit Summary", but are typically between 1-4 weeks after surgery.  OTHER INSTRUCTIONS:   Knee Replacement:  Do not place pillow under knee, focus on keeping  the knee straight while resting. CPM instructions: 0-90 degrees, 2 hours in the morning, 2 hours in the afternoon,  and 2 hours in the evening. Place foam block, curve side up under heel at all times except when in CPM or when walking.  DO NOT modify, tear, cut, or change the foam block in any way.  POST-OPERATIVE OPIOID TAPER INSTRUCTIONS: It is important to wean off of your opioid medication as soon as possible. If you do not need pain medication after your surgery it is ok to stop day one. Opioids include: Codeine, Hydrocodone(Norco, Vicodin), Oxycodone(Percocet, oxycontin) and hydromorphone amongst others.  Long term and even short term use of opiods can cause: Increased pain response Dependence Constipation Depression Respiratory depression And more.  Withdrawal symptoms can include Flu like symptoms Nausea, vomiting And more Techniques to manage these symptoms Hydrate well Eat regular healthy meals Stay active Use relaxation techniques(deep breathing, meditating, yoga) Do Not substitute Alcohol to help with tapering If you have been on opioids for less than two weeks and do not have pain than it is ok to stop all together.  Plan to wean off of opioids This plan should start within one week post op of your joint replacement. Maintain the same interval or time between taking each dose and first decrease the dose.  Cut the total daily intake of opioids by one tablet each day Next start to increase the time between doses. The last dose that should be eliminated is the evening dose.     MAKE SURE YOU:  Understand these instructions.  Get help right away if you are not doing well or get worse.    Thank you for letting us be a part of your medical care team.  It is a privilege we respect greatly.  We hope these instructions will help you stay on track for a fast and full recovery!   Increase activity slowly as tolerated   Complete by: As directed    Increase activity slowly as  tolerated   Complete by: As directed    Post-operative opioid taper instructions:   Complete by: As directed    POST-OPERATIVE OPIOID TAPER INSTRUCTIONS: It is important to wean off of your opioid medication as soon as possible. If you do not need pain medication after your surgery it is ok to stop day one. Opioids include: Codeine, Hydrocodone(Norco, Vicodin), Oxycodone(Percocet, oxycontin) and hydromorphone amongst others.  Long term and even short term use of opiods can cause: Increased pain response Dependence Constipation Depression Respiratory depression And more.  Withdrawal symptoms can include Flu like symptoms Nausea, vomiting And more Techniques to manage these symptoms Hydrate well Eat regular healthy meals Stay active Use relaxation techniques(deep breathing, meditating, yoga) Do Not substitute Alcohol to help with tapering If you have been on opioids for less than two weeks and do not have pain than it is ok to stop all together.  Plan to wean off of opioids This plan should start within one week post op of your joint replacement. Maintain the same interval or time between taking each dose and first decrease the dose.  Cut the total daily intake of opioids by one tablet each day Next start to increase the time between doses. The last dose that should be eliminated is the evening dose.      Post-operative opioid taper instructions:   Complete by: As directed    POST-OPERATIVE OPIOID TAPER INSTRUCTIONS: It is important to wean off of your opioid medication as soon as possible. If you do not need pain medication after your surgery it is ok to stop day one. Opioids include:  Codeine, Hydrocodone(Norco, Vicodin), Oxycodone(Percocet, oxycontin) and hydromorphone amongst others.  Long term and even short term use of opiods can cause: Increased pain response Dependence Constipation Depression Respiratory depression And more.  Withdrawal symptoms can include Flu  like symptoms Nausea, vomiting And more Techniques to manage these symptoms Hydrate well Eat regular healthy meals Stay active Use relaxation techniques(deep breathing, meditating, yoga) Do Not substitute Alcohol to help with tapering If you have been on opioids for less than two weeks and do not have pain than it is ok to stop all together.  Plan to wean off of opioids This plan should start within one week post op of your joint replacement. Maintain the same interval or time between taking each dose and first decrease the dose.  Cut the total daily intake of opioids by one tablet each day Next start to increase the time between doses. The last dose that should be eliminated is the evening dose.          Follow-up Information    Marcene Corning, MD. Schedule an appointment as soon as possible for a visit in 2 weeks.   Specialty: Orthopedic Surgery Contact information: [address] [place] [place] [address] [phone]                Signed: Ginger Organ Cristian Grieves 11/13/2020, 8:19 AM

## 2020-11-13 NOTE — Progress Notes (Signed)
Physical Therapy Treatment Patient Details Name: [name] MRN: [id number] DOB: [birth year] Today's Date: 11/13/2020    History of Present Illness Patient is 80 y.o. female s/p Lt THA anterior approach on 11/12/20 with PMH significant for TIA's, seizure, MI, HTN, HLD, GERD, DM, CAD, back pain.    PT Comments    POD # 1 am session Assisted OOB to [facility].  General bed mobility comments: demonstrated and instructed on how to use belt to self assist LE.  Required increased time x 3 to transition to EOB. General transfer comment: 50% VC's on proper hand placement and increased time to self rise.  Assisted on/off BSC with effort.  10/10 pain.  General Gait Details: 25% VC's on proper walker placement and sequencing.  Limited distance of 3 feet due to pain level and effort. Positioned in recliner to comfort and applied ICE.    Follow Up Recommendations  Follow surgeon's recommendation for DC plan and follow-up therapies     Equipment Recommendations  None recommended by PT    Recommendations for Other Services       Precautions / Restrictions Precautions Precautions: Fall Restrictions Weight Bearing Restrictions: No LLE Weight Bearing: Weight bearing as tolerated    Mobility  Bed Mobility Overal bed mobility: Needs Assistance Bed Mobility: Supine to Sit     Supine to sit: Mod assist;HOB elevated;Max assist     General bed mobility comments: demonstrated and instructed on how to use belt to self assist LE.  Required increased time x 3 to transition to EOB.    Transfers Overall transfer level: Needs assistance Equipment used: Rolling walker (2 wheeled) Transfers: Sit to/from [employer] Transfers   Stand pivot transfers: Min assist;From elevated surface       General transfer comment: 50% VC's on proper hand placement and increased time to self rise.  Assisted on/off BSC with effort.  10/10 pain  Ambulation/Gait Ambulation/Gait assistance: Min assist;Mod  assist Gait Distance (Feet): 3 Feet Assistive device: Rolling walker (2 wheeled) Gait Pattern/deviations: Step-to pattern;Decreased stance time - left Gait velocity: decreased   General Gait Details: 25% VC's on proper walker placement and sequencing.  Limited distance of 3 feet due to pain level and effort.   Stairs             Wheelchair Mobility    Modified Rankin (Stroke Patients Only)       Balance                                            Cognition Arousal/Alertness: Awake/alert Behavior During Therapy: WFL for tasks assessed/performed Overall Cognitive Status: Within Functional Limits for tasks assessed                                 General Comments: AxO x 3 very pleasant but MAX pain      Exercises      General Comments        Pertinent Vitals/Pain Pain Assessment: 0-10 Faces Pain Scale: Hurts worst Pain Location: L hip Pain Descriptors / Indicators: Aching;Discomfort;Sore Pain Intervention(s): Monitored during session;Premedicated before session;Repositioned;Ice applied    Home Living                      Prior Function  PT Goals (current goals can now be found in the care plan section) Progress towards PT goals: Progressing toward goals    Frequency    7X/week      PT Plan Current plan remains appropriate    Co-evaluation              AM-PAC PT "6 Clicks" Mobility   Outcome Measure  Help needed turning from your back to your side while in a flat bed without using bedrails?: A Little Help needed moving from lying on your back to sitting on the side of a flat bed without using bedrails?: A Lot Help needed moving to and from a bed to a chair (including a wheelchair)?: A Lot Help needed standing up from a chair using your arms (e.g., wheelchair or bedside chair)?: A Little Help needed to walk in hospital room?: A Lot Help needed climbing 3-5 steps with a railing? : A  Lot 6 Click Score: 14    End of Session Equipment Utilized During Treatment: Gait belt Activity Tolerance: Patient tolerated treatment well Patient left: in bed;with call bell/phone within reach Nurse Communication: Mobility status PT Visit Diagnosis: Muscle weakness (generalized) (M62.81);Difficulty in walking, not elsewhere classified (R26.2)     Time: 1020-1045 PT Time Calculation (min) (ACUTE ONLY): 25 min  Charges:  $Gait Training: 8-22 mins $Therapeutic Activity: 8-22 mins                     Felecia Shelling  PTA Acute  Rehabilitation Services Pager      [phone] Office      [phone]

## 2020-11-13 NOTE — Plan of Care (Signed)
  Problem: Pain Managment: Goal: General experience of comfort will improve Outcome: Progressing   

## 2020-11-13 NOTE — Progress Notes (Signed)
Subjective: 1 Day Post-Op Procedure(s) (LRB): LEFT TOTAL HIP ARTHROPLASTY ANTERIOR APPROACH (Left)   Patient doing much better today. She is looking forward to going home.  Activity level:  wbat Diet tolerance:  ok Voiding:  ok Patient reports pain as mild.    Objective: Vital signs in last 24 hours: Temp:  [97.5 F (36.4 C)-99.5 F (37.5 C)] 98.9 F (37.2 C) (05/04 0523) Pulse Rate:  [57-81] 75 (05/04 0523) Resp:  [10-20] 15 (05/04 0523) BP: (91-170)/(56-90) 135/82 (05/04 0523) SpO2:  [95 %-100 %] 100 % (05/04 0523)  Labs: No results for input(s): HGB in the last 72 hours. No results for input(s): WBC, RBC, HCT, PLT in the last 72 hours. No results for input(s): NA, K, CL, CO2, BUN, CREATININE, GLUCOSE, CALCIUM in the last 72 hours. No results for input(s): LABPT, INR in the last 72 hours.  Physical Exam:  Neurologically intact ABD soft Neurovascular intact Sensation intact distally Intact pulses distally Dorsiflexion/Plantar flexion intact Incision: dressing C/D/I and no drainage No cellulitis present Compartment soft  Assessment/Plan:  1 Day Post-Op Procedure(s) (LRB): LEFT TOTAL HIP ARTHROPLASTY ANTERIOR APPROACH (Left) Advance diet Up with therapy D/C IV fluids Discharge home with home health today if cleared and doing well by PT. Continue on 81mg  asa BID x 4 weeks post op. Follow up in office 2 weeks post op.    Dreyden Rohrman 11/13/2020, 8:16 AM

## 2020-11-14 DIAGNOSIS — M1612 Unilateral primary osteoarthritis, left hip: Secondary | ICD-10-CM | POA: Diagnosis not present

## 2020-11-14 NOTE — Progress Notes (Signed)
Physical Therapy Treatment Patient Details Name: [name] MRN: [id number] DOB: 04-14-1941 Today's Date: 11/14/2020    History of Present Illness Patient is 80 y.o. female s/p Lt THA anterior approach on 11/12/20 with PMH significant for TIA's, seizure, MI, HTN, HLD, GERD, DM, CAD, back pain.    PT Comments    POD # 2 pm session Assisted OOB to amb to bathroom then back to bed required increased time and effort.   Pt NOT progressing as well as expected and will need ST Rehab at SNF prior to returning home alone.    Follow Up Recommendations  SNF     Equipment Recommendations  None recommended by PT    Recommendations for Other Services       Precautions / Restrictions Precautions Precautions: Fall Restrictions Weight Bearing Restrictions: No Other Position/Activity Restrictions: WBAT    Mobility  Bed Mobility Overal bed mobility: Needs Assistance Bed Mobility: Supine to Sit;Sit to Supine     Supine to sit: Min assist Sit to supine: Max assist   General bed mobility comments: demonstrated and instructed on how to use belt to self assist LE.  Required increased time x 2 to transition to EOB. Required increased effort today due to increased c/o pain and fatigue.  Required extra assist back to bed as pt was unable.    Transfers Overall transfer level: Needs assistance Equipment used: Rolling walker (2 wheeled) Transfers: Sit to/from [employer] Sit to Stand: Min assist;From elevated surface Stand pivot transfers: Min assist;From elevated surface       General transfer comment: 25% VC's on proper hand placement and increased time to self rise. Pt unable to self assist back to bed.  Max Assist to support B LE up into bed.  Ambulation/Gait Ambulation/Gait assistance: Min assist Gait Distance (Feet): 18 Feet Assistive device: Rolling walker (2 wheeled) Gait Pattern/deviations: Step-to pattern;Decreased stance time - left Gait velocity:  decreased   General Gait Details: 25% VC's on proper walker placement and sequencing.  Tolerated an increased distance however limited weight shift onto L LE and great difficulty advancing L LE.  Unstaedy.  Max effort with c/o fatigue post. HIGH FALL RISK.   Stairs             Wheelchair Mobility    Modified Rankin (Stroke Patients Only)       Balance                                            Cognition Arousal/Alertness: Awake/alert Behavior During Therapy: WFL for tasks assessed/performed                                   General Comments: AxO x 3 very pleasant independent lady      Exercises      General Comments        Pertinent Vitals/Pain Pain Assessment: 0-10 Pain Score: 5  Pain Location: L hip Pain Descriptors / Indicators: Aching;Discomfort;Sore;Operative site guarding Pain Intervention(s): Monitored during session;Premedicated before session;Repositioned;Ice applied    Home Living                      Prior Function            PT Goals (current goals can now be found in the care  plan section)      Frequency    7X/week      PT Plan Current plan remains appropriate    Co-evaluation              AM-PAC PT "6 Clicks" Mobility   Outcome Measure  Help needed turning from your back to your side while in a flat bed without using bedrails?: A Little Help needed moving from lying on your back to sitting on the side of a flat bed without using bedrails?: A Little Help needed moving to and from a bed to a chair (including a wheelchair)?: A Little Help needed standing up from a chair using your arms (e.g., wheelchair or bedside chair)?: A Little Help needed to walk in hospital room?: A Little Help needed climbing 3-5 steps with a railing? : A Lot 6 Click Score: 17    End of Session Equipment Utilized During Treatment: Gait belt Activity Tolerance: Patient limited by fatigue;Patient limited by  pain Patient left: in bed;with call bell/phone within reach;with bed alarm set;with family/visitor present Nurse Communication: Mobility status PT Visit Diagnosis: Muscle weakness (generalized) (M62.81);Difficulty in walking, not elsewhere classified (R26.2)     Time: 1510-1540 PT Time Calculation (min) (ACUTE ONLY): 30 min  Charges:  $Gait Training: 8-22 mins $Therapeutic Activity: 8-22 mins                     Felecia Shelling  PTA Acute  Rehabilitation Services Pager      [phone] Office      [phone]

## 2020-11-14 NOTE — Progress Notes (Signed)
Physical Therapy Treatment Patient Details Name: [name] MRN: [id number] DOB: May 14, 1941 Today's Date: 11/14/2020    History of Present Illness Patient is 80 y.o. female s/p Lt THA anterior approach on 11/12/20 with PMH significant for TIA's, seizure, MI, HTN, HLD, GERD, DM, CAD, back pain.    PT Comments    POD # 2 am session Son Loraine Leriche present during session for family education.  Pt NOT progress as well as expected.  Amb remains limited and unsteady.  Assisted OOB to amb to bathroom was difficult.  General bed mobility comments: demonstrated and instructed on how to use belt to self assist LE.  Required increased time x 2 to transition to EOB. Required increased effort today due to increased c/o pain and fatigue.  Required extra assist back to bed as pt was unable. General transfer comment: 25% VC's on proper hand placement and increased time to self rise. Pt unable to self assist back to bed.  Max Assist to support B LE up into bed. General Gait Details: 25% VC's on proper walker placement and sequencing.  Tolerated an increased distance however limited weight shift onto L LE and great difficulty advancing L LE.  Unstaedy.  Max effort with c/o fatigue post. HIGH FALL RISK.  Required extra assist off toilet as pt was unable to self rise due to pain/fatigue/effort.  Son stated he will only be in town "a few days" and pt will not have anyone who can stay with her.  Discussed ST Rehab at SNF and pt and Son agree "that would be best".  Will consult LPT and notify RN.   Follow Up Recommendations  Follow surgeon's recommendation for DC plan and follow-up therapies     Equipment Recommendations  None recommended by PT    Recommendations for Other Services       Precautions / Restrictions Precautions Precautions: Fall Restrictions Weight Bearing Restrictions: No LLE Weight Bearing: Weight bearing as tolerated Other Position/Activity Restrictions: WBAT    Mobility  Bed  Mobility Overal bed mobility: Needs Assistance Bed Mobility: Supine to Sit;Sit to Supine     Supine to sit: Min assist Sit to supine: Max assist   General bed mobility comments: demonstrated and instructed on how to use belt to self assist LE.  Required increased time x 2 to transition to EOB. Required increased effort today due to increased c/o pain and fatigue.  Required extra assist back to bed as pt was unable.    Transfers Overall transfer level: Needs assistance Equipment used: Rolling walker (2 wheeled) Transfers: Sit to/from [employer] Sit to Stand: Min assist;From elevated surface Stand pivot transfers: Min assist;From elevated surface       General transfer comment: 25% VC's on proper hand placement and increased time to self rise. Pt unable to self assist back to bed.  Max Assist to support B LE up into bed.  Ambulation/Gait Ambulation/Gait assistance: Min assist Gait Distance (Feet): 18 Feet Assistive device: Rolling walker (2 wheeled) Gait Pattern/deviations: Step-to pattern;Decreased stance time - left Gait velocity: decreased   General Gait Details: 25% VC's on proper walker placement and sequencing.  Tolerated an increased distance however limited weight shift onto L LE and great difficulty advancing L LE.  Unstaedy.  Max effort with c/o fatigue post. HIGH FALL RISK.   Stairs             Wheelchair Mobility    Modified Rankin (Stroke Patients Only)       Balance  Cognition Arousal/Alertness: Awake/alert Behavior During Therapy: WFL for tasks assessed/performed                                   General Comments: AxO x 3 very pleasant independent lady      Exercises      General Comments        Pertinent Vitals/Pain Pain Assessment: 0-10 Pain Score: 9  Pain Location: L hip Pain Descriptors / Indicators: Aching;Discomfort;Sore;Operative site  guarding Pain Intervention(s): Monitored during session;Repositioned;Patient requesting pain meds-RN notified;Ice applied    Home Living                      Prior Function            PT Goals (current goals can now be found in the care plan section) Progress towards PT goals: Progressing toward goals    Frequency    7X/week      PT Plan Current plan remains appropriate    Co-evaluation              AM-PAC PT "6 Clicks" Mobility   Outcome Measure  Help needed turning from your back to your side while in a flat bed without using bedrails?: A Little Help needed moving from lying on your back to sitting on the side of a flat bed without using bedrails?: A Little Help needed moving to and from a bed to a chair (including a wheelchair)?: A Little Help needed standing up from a chair using your arms (e.g., wheelchair or bedside chair)?: A Little Help needed to walk in hospital room?: A Little Help needed climbing 3-5 steps with a railing? : A Lot 6 Click Score: 17    End of Session Equipment Utilized During Treatment: Gait belt Activity Tolerance: Patient limited by fatigue;Patient limited by pain Patient left: in bed;with call bell/phone within reach;with bed alarm set;with family/visitor present Nurse Communication: Mobility status PT Visit Diagnosis: Muscle weakness (generalized) (M62.81);Difficulty in walking, not elsewhere classified (R26.2)     Time: 1194-1740 PT Time Calculation (min) (ACUTE ONLY): 30 min  Charges:  $Gait Training: 8-22 mins $Therapeutic Activity: 8-22 mins                     Felecia Shelling  PTA Acute  Rehabilitation Services Pager      [phone] Office      [phone]

## 2020-11-14 NOTE — [place] FL2 (Signed)
[place] MEDICAID FL2 LEVEL OF CARE SCREENING TOOL     IDENTIFICATION  Patient Name: [name] Birthdate: 02-04-1941 Sex: female Admission Date (Current Location): 11/12/2020  [facility] and [place] Number:  Producer, television/film/video and Address:  [facility],  [address] [place] [address]      Provider Number: [id number]  Attending Physician Name and Address:  Marcene Corning, MD  Relative Name and Phone Number:  Toyoko Silos (son) Ph: [phone]    Current Level of Care: Hospital Recommended Level of Care: Skilled Nursing Facility Prior Approval Number:    Date Approved/Denied:   PASRR Number: [id number]  Discharge Plan: SNF    Current Diagnoses: Patient Active Problem List   Diagnosis Date Noted  . Primary osteoarthritis of left hip 11/12/2020  . Spinal stenosis of lumbar region with radiculopathy 11/21/2019  . CVA (cerebral vascular accident) (HCC) 05/31/2017  . Stroke (cerebrum) (HCC) 05/31/2017  . Colon cancer screening 04/13/2013  . IBS (irritable bowel syndrome) 11/24/2012  . HTN (hypertension) 11/24/2012  . Diabetes (HCC) 11/24/2012  . S/P coronary artery stent placement 11/24/2012  . Hx of myocardial infarction 11/24/2012  . CVA (cerebral infarction) 11/24/2012  . H/O gastric bypass 11/24/2012  . S/P cholecystectomy 11/24/2012    Orientation RESPIRATION BLADDER Height & Weight     Self,Time,Situation,Place  Normal Continent Weight: 132 lb 0.9 oz (59.9 kg) Height:  5\' 9"  (175.3 cm)  BEHAVIORAL SYMPTOMS/MOOD NEUROLOGICAL BOWEL NUTRITION STATUS      Continent Diet (Regular diet)  AMBULATORY STATUS COMMUNICATION OF NEEDS Skin   Extensive Assist Verbally Surgical wounds,Other (Comment) (Ecchymosis: bilateral arms)                       Personal Care Assistance Level of Assistance  Bathing,Feeding,Dressing Bathing Assistance: Limited assistance Feeding assistance: Independent Dressing Assistance: Limited assistance      Functional Limitations Info  Sight,Hearing,Speech Sight Info: Adequate Hearing Info: Adequate Speech Info: Adequate    SPECIAL CARE FACTORS FREQUENCY  PT (By licensed PT),OT (By licensed OT)     PT Frequency: 5x's/week OT Frequency: 5x's/week            Contractures Contractures Info: Not present    Additional Factors Info  Code Status,Allergies Code Status Info: Full Allergies Info: Escitalopram Oxalate, Byetta 5 Mcg Pen (Exenatide), Codeine Sulfate, Metoprolol Tartrate, Lipitor (Atorvastatin)           Current Medications (11/14/2020):  This is the current hospital active medication list Current Facility-Administered Medications  Medication Dose Route Frequency Provider Last Rate Last Admin  . acetaminophen (TYLENOL) tablet 325-650 mg  325-650 mg Oral Q6H PRN 01/14/2021, PA-C      . alum & mag hydroxide-simeth (MAALOX/MYLANTA) 200-200-20 MG/5ML suspension 30 mL  30 mL Oral Q4H PRN 01-19-2001, PA-C      . aspirin chewable tablet 81 mg  81 mg Oral BID Elodia Florence, PA-C   81 mg at 11/14/20 0934  . bisacodyl (DULCOLAX) EC tablet 5 mg  5 mg Oral Daily PRN 01/14/21, PA-C      . carvedilol (COREG) tablet 25 mg  25 mg Oral BID WC Elodia Florence, PA-C   25 mg at 11/14/20 0933  . diphenhydrAMINE (BENADRYL) 12.5 MG/5ML elixir 12.5-25 mg  12.5-25 mg Oral Q4H PRN 08-12-1984, PA-C      . docusate sodium (COLACE) capsule 100 mg  100 mg Oral BID Elodia Florence, PA-C   100 mg at 11/14/20 0934  .  glimepiride (AMARYL) tablet 4 mg  4 mg Oral BID AC Elodia Florence, PA-C   4 mg at 11/14/20 6962  . HYDROcodone-acetaminophen (NORCO) 7.5-325 MG per tablet 1-2 tablet  1-2 tablet Oral Q4H PRN Elodia Florence, PA-C   1 tablet at 11/13/20 2224  . HYDROcodone-acetaminophen (NORCO/VICODIN) 5-325 MG per tablet 1-2 tablet  1-2 tablet Oral Q4H PRN Elodia Florence, PA-C   2 tablet at 11/14/20 1404  . lactated ringers bolus 250 mL  250 mL Intravenous Once Elodia Florence, PA-C      . lactated ringers infusion    Intravenous Continuous Elodia Florence, PA-C 75 mL/hr at 11/12/20 1948 New Bag at 11/12/20 1948  . levETIRAcetam (KEPPRA) tablet 500 mg  500 mg Oral BID Elodia Florence, PA-C   500 mg at 11/14/20 9528  . menthol-cetylpyridinium (CEPACOL) lozenge 3 mg  1 lozenge Oral PRN Elodia Florence, PA-C       Or  . phenol (CHLORASEPTIC) mouth spray 1 spray  1 spray Mouth/Throat PRN Elodia Florence, PA-C      . metFORMIN (GLUCOPHAGE) tablet 500 mg  500 mg Oral BID WC Elodia Florence, PA-C   500 mg at 11/14/20 0933  . methocarbamol (ROBAXIN) tablet 500 mg  500 mg Oral Q6H PRN Elodia Florence, PA-C   500 mg at 11/13/20 2224   Or  . methocarbamol (ROBAXIN) 500 mg in dextrose 5 % 50 mL IVPB  500 mg Intravenous Q6H PRN Elodia Florence, PA-C   Stopped at 11/13/20 1446  . metoCLOPramide (REGLAN) tablet 5-10 mg  5-10 mg Oral Q8H PRN Elodia Florence, PA-C       Or  . metoCLOPramide (REGLAN) injection 5-10 mg  5-10 mg Intravenous Q8H PRN Elodia Florence, PA-C      . morphine 2 MG/ML injection 0.5-1 mg  0.5-1 mg Intravenous Q2H PRN Elodia Florence, PA-C      . ondansetron [facility]) tablet 4 mg  4 mg Oral Q6H PRN Elodia Florence, PA-C       Or  . ondansetron (ZOFRAN) injection 4 mg  4 mg Intravenous Q6H PRN Elodia Florence, PA-C      . pantoprazole (PROTONIX) EC tablet 40 mg  40 mg Oral BID Elodia Florence, PA-C   40 mg at 11/14/20 0934  . rosuvastatin (CRESTOR) tablet 5 mg  5 mg Oral BID Elodia Florence, PA-C   5 mg at 11/14/20 4132  . tranexamic acid (CYKLOKAPRON) IVPB 1,000 mg  1,000 mg Intravenous Once Elodia Florence, PA-C         Discharge Medications: Please see discharge summary for a list of discharge medications.  Relevant Imaging Results:  Relevant Lab Results:   Additional Information SSN: [id number]  Ewing Schlein, LCSW

## 2020-11-14 NOTE — Progress Notes (Signed)
Subjective: 2 Days Post-Op Procedure(s) (LRB): LEFT TOTAL HIP ARTHROPLASTY ANTERIOR APPROACH (Left)   Patient states that she feels a lot better this morning. She is hoping to go home today.  Activity level:  wbat Diet tolerance:  ok Voiding:  ok Patient reports pain as mild.    Objective: Vital signs in last 24 hours: Temp:  [98 F (36.7 C)-99.5 F (37.5 C)] 98.7 F (37.1 C) (05/05 0545) Pulse Rate:  [74-78] 75 (05/05 0545) Resp:  [16-19] 16 (05/05 0545) BP: (111-150)/(63-85) 114/63 (05/05 0545) SpO2:  [94 %-100 %] 94 % (05/05 0545)  Labs: No results for input(s): HGB in the last 72 hours. No results for input(s): WBC, RBC, HCT, PLT in the last 72 hours. No results for input(s): NA, K, CL, CO2, BUN, CREATININE, GLUCOSE, CALCIUM in the last 72 hours. No results for input(s): LABPT, INR in the last 72 hours.  Physical Exam:  Neurologically intact ABD soft Neurovascular intact Sensation intact distally Intact pulses distally Dorsiflexion/Plantar flexion intact No cellulitis present Compartment soft  Assessment/Plan:  2 Days Post-Op Procedure(s) (LRB): LEFT TOTAL HIP ARTHROPLASTY ANTERIOR APPROACH (Left) Advance diet Up with therapy Discharge home with home health today after PT if cleared and doing well. Follow up in office 2 weeks post op. Continue on 81mg  asa BID for DVT prevention.    Keeli Roberg 11/14/2020, 6:36 AM

## 2020-11-14 NOTE — TOC Progression Note (Signed)
Transition of Care [facility]) - Progression Note   Patient Details  Name: [name] MRN: [id number] Date of Birth: 1941-03-04  Transition of Care [facility]) CM/SW Gooding, LCSW Phone Number: 11/14/2020, 2:44 PM  Clinical Narrative: PT is now recommending SNF for rehab. CSW met with patient to get consent for referrals. Patient's first choice is [facility]. Patient has been vaccinated for COVID. FL2 completed; PASRR received. Initial referral faxed out. TOC awaiting bed offers. Facility will need to start insurance authorization.  Expected Discharge Plan: Skilled Nursing Facility Barriers to Discharge: SNF Pending bed offer  Expected Discharge Plan and Services Expected Discharge Plan: Long Choice: [place] Expected Discharge Date: 11/14/20               DME Arranged: N/A DME Agency: NA HH Arranged: PT HH Agency: [facility] Date HH Agency Contacted: 11/13/20 Time [place]: 1204 Representative spoke with at [place]: Danae Chen  Readmission Risk Interventions No flowsheet data found.

## 2020-11-15 ENCOUNTER — Observation Stay (HOSPITAL_COMMUNITY): Payer: Medicare (Managed Care)

## 2020-11-15 DIAGNOSIS — M1612 Unilateral primary osteoarthritis, left hip: Secondary | ICD-10-CM | POA: Diagnosis not present

## 2020-11-15 LAB — CBC
HCT: 28.3 % — ABNORMAL LOW (ref 36.0–46.0)
Hemoglobin: 9.1 g/dL — ABNORMAL LOW (ref 12.0–15.0)
MCH: 30.6 pg (ref 26.0–34.0)
MCHC: 32.2 g/dL (ref 30.0–36.0)
MCV: 95.3 fL (ref 80.0–100.0)
Platelets: 133 10*3/uL — ABNORMAL LOW (ref 150–400)
RBC: 2.97 MIL/uL — ABNORMAL LOW (ref 3.87–5.11)
RDW: 13.2 % (ref 11.5–15.5)
WBC: 5.6 10*3/uL (ref 4.0–10.5)
nRBC: 0 % (ref 0.0–0.2)

## 2020-11-15 LAB — BASIC METABOLIC PANEL
Anion gap: 9 (ref 5–15)
BUN: 18 mg/dL (ref 8–23)
CO2: 21 mmol/L — ABNORMAL LOW (ref 22–32)
Calcium: 8.8 mg/dL — ABNORMAL LOW (ref 8.9–10.3)
Chloride: 104 mmol/L (ref 98–111)
Creatinine, Ser: 1.08 mg/dL — ABNORMAL HIGH (ref 0.44–1.00)
GFR, Estimated: 52 mL/min — ABNORMAL LOW (ref >60)
Glucose, Bld: 168 mg/dL — ABNORMAL HIGH (ref 70–99)
Potassium: 4.5 mmol/L (ref 3.5–5.1)
Sodium: 134 mmol/L — ABNORMAL LOW (ref 135–145)

## 2020-11-15 LAB — GLUCOSE, CAPILLARY
Glucose-Capillary: 135 mg/dL — ABNORMAL HIGH (ref 70–99)
Glucose-Capillary: 187 mg/dL — ABNORMAL HIGH (ref 70–99)
Glucose-Capillary: 194 mg/dL — ABNORMAL HIGH (ref 70–99)
Glucose-Capillary: 207 mg/dL — ABNORMAL HIGH (ref 70–99)

## 2020-11-15 MED ORDER — ASPIRIN EC 81 MG PO TBEC: 81.0000 mg | DELAYED_RELEASE_TABLET | Freq: Two times a day (BID) | ORAL | 0 refills | Status: DC

## 2020-11-15 MED ORDER — HYDROCODONE-ACETAMINOPHEN 5-325 MG PO TABS: 1.0000 | ORAL_TABLET | Freq: Four times a day (QID) | ORAL | 0 refills | Status: DC | PRN

## 2020-11-15 MED ORDER — TIZANIDINE HCL 4 MG PO TABS: 4.0000 mg | ORAL_TABLET | Freq: Four times a day (QID) | ORAL | 1 refills | Status: AC | PRN

## 2020-11-15 NOTE — Progress Notes (Signed)
Physical Therapy Treatment Patient Details Name: [name] MRN: [id number] DOB: 02/26/1941 Today's Date: 11/15/2020    History of Present Illness Patient is 80 y.o. female s/p Lt THA anterior approach on 11/12/20 with PMH significant for TIA's, seizure, MI, HTN, HLD, GERD, DM, CAD, back pain.    PT Comments    POD # 3 Performed THR TE's first while in bed then assisted OOB to amb.  General bed mobility comments: increased ability to self move L LE using belt with decreased pain this afternoon vs this morning session.  General transfer comment: initial posterior LOB and required increased time.  Slow moving.  Pain is better controlled.  Pt feels "extra sleepy" but the increased dosage of pain meds is alowwing her to move better with less pain. General Gait Details: finally got her to amb in hallway this session.  Increased pain med dosage has allowed her to increase her activity.  Still, slow and groggy with gait instability but tolerating better with 4/10 hip pain vs her 8/9 pain level from prior sessions. Pt lives alone and will need [facility] prior.   Follow Up Recommendations  SNF     Equipment Recommendations  None recommended by PT    Recommendations for Other Services       Precautions / Restrictions Precautions Precautions: Fall Restrictions Weight Bearing Restrictions: No LLE Weight Bearing: Weight bearing as tolerated    Mobility  Bed Mobility Overal bed mobility: Needs Assistance Bed Mobility: Supine to Sit;Sit to Supine     Supine to sit: Min assist Sit to supine: Max assist   General bed mobility comments: increased ability to self move L LE using belt with decreased pain this afternoon vs this morning session    Transfers Overall transfer level: Needs assistance Equipment used: Rolling walker (2 wheeled) Transfers: Sit to/from [employer] Sit to Stand: Min assist;From elevated surface Stand pivot transfers: Min assist;From  elevated surface       General transfer comment: initial posterior LOB and required increased time.  Slow moving.  Pain is better controlled.  Pt feels "extra sleepy" but the increased dosage of pain meds is alowwing her to move better with less pain.  Ambulation/Gait Ambulation/Gait assistance: Min assist Gait Distance (Feet): 42 Feet Assistive device: Rolling walker (2 wheeled) Gait Pattern/deviations: Step-to pattern;Decreased stance time - left Gait velocity: decreased   General Gait Details: finally got her to amb in hallway this session.  Increased pain med dosage has allowed her to increase her activity.  Still, slow and groggy with gait instability but tolerating better with 4/10 hip pain vs her 8/9 pain level from prior sessions.   Stairs             Wheelchair Mobility    Modified Rankin (Stroke Patients Only)       Balance                                            Cognition Arousal/Alertness: Awake/alert Behavior During Therapy: WFL for tasks assessed/performed Overall Cognitive Status: Within Functional Limits for tasks assessed                                 General Comments: AxO x 3 very pleasant independent lady buy struggling due to her pain "my back  surgery was not this bad".      Exercises   Total Hip Replacement TE's following HEP Handout 10 reps ankle pumps 05 reps knee presses 05 reps heel slides 05 reps SAQ's 05 reps ABD Instructed how to use a belt loop to assist  Followed by ICE     General Comments        Pertinent Vitals/Pain Pain Assessment: 0-10 Pain Score: 8  Pain Location: L hip indication mid lateral femur (rubbiing area) Pain Descriptors / Indicators: Discomfort;Sore;Operative site guarding;Grimacing Pain Intervention(s): Monitored during session;Premedicated before session;Repositioned;Ice applied    Home Living                      Prior Function            PT Goals  (current goals can now be found in the care plan section) Progress towards PT goals: Progressing toward goals    Frequency    7X/week      PT Plan Current plan remains appropriate    Co-evaluation              AM-PAC PT "6 Clicks" Mobility   Outcome Measure  Help needed turning from your back to your side while in a flat bed without using bedrails?: A Little Help needed moving from lying on your back to sitting on the side of a flat bed without using bedrails?: A Little Help needed moving to and from a bed to a chair (including a wheelchair)?: A Little Help needed standing up from a chair using your arms (e.g., wheelchair or bedside chair)?: A Little Help needed to walk in hospital room?: A Little Help needed climbing 3-5 steps with a railing? : A Lot 6 Click Score: 17    End of Session Equipment Utilized During Treatment: Gait belt Activity Tolerance: Patient limited by fatigue Patient left: in bed;with call bell/phone within reach;with bed alarm set;with family/visitor present Nurse Communication: Mobility status PT Visit Diagnosis: Muscle weakness (generalized) (M62.81);Difficulty in walking, not elsewhere classified (R26.2)     Time: 9628-3662 PT Time Calculation (min) (ACUTE ONLY): 32 min  Charges:  $Gait Training: 8-22 mins $Therapeutic Exercise: 8-22 mins                      Felecia Shelling  PTA Acute  Rehabilitation Services Pager      [phone] Office      [phone]

## 2020-11-15 NOTE — Progress Notes (Signed)
Subjective: 3 Days Post-Op Procedure(s) (LRB): LEFT TOTAL HIP ARTHROPLASTY ANTERIOR APPROACH (Left)   Patient is not progressing as we would anticipate. She has no pain at rest but pain with ambulation. She wants to go home but is concerned about having proper help and assistance. PT is recommending SNF.  Activity level:  wbat Diet tolerance:  ok Voiding:  ok Patient reports pain as mild and moderate.    Objective: Vital signs in last 24 hours: Temp:  [98 F (36.7 C)-98.7 F (37.1 C)] 98.7 F (37.1 C) (05/06 0518) Pulse Rate:  [66-76] 76 (05/06 0518) Resp:  [16-17] 17 (05/06 0518) BP: (91-135)/(56-69) 135/69 (05/06 0518) SpO2:  [95 %-100 %] 98 % (05/06 0518)  Labs: No results for input(s): HGB in the last 72 hours. No results for input(s): WBC, RBC, HCT, PLT in the last 72 hours. No results for input(s): NA, K, CL, CO2, BUN, CREATININE, GLUCOSE, CALCIUM in the last 72 hours. No results for input(s): LABPT, INR in the last 72 hours.  Physical Exam:  Neurologically intact ABD soft Neurovascular intact Sensation intact distally Intact pulses distally Dorsiflexion/Plantar flexion intact Incision: dressing C/D/I No cellulitis present Compartment soft  Assessment/Plan:  3 Days Post-Op Procedure(s) (LRB): LEFT TOTAL HIP ARTHROPLASTY ANTERIOR APPROACH (Left) Advance diet Up with therapy  I will order a CBC, BMP, and left hip x-rays to make sure everything looks ok.  I will speak with patient after results are back. If it is only pain and weakness limiting her progress and all labs and x rays look ok she will likely need SNF placement.  We will reassess this afternoon.   [name] 11/15/2020, 8:08 AM

## 2020-11-15 NOTE — TOC Progression Note (Signed)
Transition of Care [facility]) - Progression Note   Patient Details  Name: CALIN ELLERY MRN: [id number] Date of Birth: 09/23/1940  Transition of [facility]) CM/SW Contact  Ewing Schlein, LCSW Phone Number: 11/15/2020, 1:29 PM  Clinical Narrative: CSW called [facility] and [employer] and was informed the facility is out-of-network. Patient's only bed offer is [facility] as patient's Rosann Auerbach Medicare is out-of-network with most SNFs. CSW updated patient. Patient aware there is only one bed offer at this time. Patient asked about person care services [facility]) being covered by insurance if she discharges home. CSW explained PCS is private pay and not covered by Medicare. TOC to follow.  Expected Discharge Plan: Skilled Nursing Facility Barriers to Discharge: SNF Pending bed offer  Expected Discharge Plan and Services Expected Discharge Plan: Skilled Nursing Facility Post Acute Care Choice: Skilled Nursing Facility Expected Discharge Date: 11/14/20               DME Arranged: N/A DME Agency: NA HH Arranged: PT HH Agency: [facility] Date HH Agency Contacted: 11/13/20 Time HH Agency Contacted: 1204 Representative spoke with at [facility] Agency: Alcario Drought  Readmission Risk Interventions No flowsheet data found.

## 2020-11-15 NOTE — Progress Notes (Signed)
Physical Therapy Treatment Patient Details Name: [name] MRN: [id number] DOB: Oct 13, 1940 Today's Date: 11/15/2020    History of Present Illness Patient is 80 y.o. female s/p Lt THA anterior approach on 11/12/20 with PMH significant for TIA's, seizure, MI, HTN, HLD, GERD, DM, CAD, back pain.    PT Comments    POD # 3 am session Assisted OOB to amb to bathroom.  General bed mobility comments: pt used her gait belt to self assist but still required Min Assist to complete esp with scooting to EOB.  Then required increased assist back to bed to support L LE onto bed due to pain and effort. General transfer comment: 25% VC's on proper hand placement and increased time to self rise. Also assisted with a toilet tranfer in which pt required increased assist to rise from lower level. General Gait Details: pt only amb to and from bathroom due to pain not high up in her hip but indicating distal to stem/mid lateral femur.  Pt did have an x ray this morning showing no acute findings.  Assisted back to bed, applied ICE and reported to RN "current pain meds may not be enough".    Follow Up Recommendations  SNF     Equipment Recommendations  None recommended by PT    Recommendations for Other Services       Precautions / Restrictions Precautions Precautions: Fall Restrictions Weight Bearing Restrictions: No LLE Weight Bearing: Weight bearing as tolerated    Mobility  Bed Mobility Overal bed mobility: Needs Assistance Bed Mobility: Supine to Sit;Sit to Supine     Supine to sit: Min assist Sit to supine: Max assist   General bed mobility comments: pt used her gait belt to self assist but still required Min Assist to complete esp with scooting to EOB.  Then required increased assist back to bed to support L LE onto bed due to pain and effort.    Transfers Overall transfer level: Needs assistance Equipment used: Rolling walker (2 wheeled) Transfers: Sit to/from [employer] Sit to Stand: Min assist;From elevated surface Stand pivot transfers: Min assist;From elevated surface       General transfer comment: 25% VC's on proper hand placement and increased time to self rise. Also assisted with a toilet tranfer in which pt required increased assist to rise from lower level.  Ambulation/Gait Ambulation/Gait assistance: Min assist Gait Distance (Feet): 18 Feet Assistive device: Rolling walker (2 wheeled) Gait Pattern/deviations: Step-to pattern;Decreased stance time - left Gait velocity: decreased   General Gait Details: pt only amb to and from bathroom due to pain not high up in her hip but indicating distal to stem/mid lateral femur.  Pt did have an x ray this morning showing no acute findings.   Stairs             Wheelchair Mobility    Modified Rankin (Stroke Patients Only)       Balance                                            Cognition Arousal/Alertness: Awake/alert Behavior During Therapy: WFL for tasks assessed/performed Overall Cognitive Status: Within Functional Limits for tasks assessed                                 General Comments:  AxO x 3 very pleasant independent lady buy struggling due to her pain "my back surgery was not this bad".      Exercises      General Comments        Pertinent Vitals/Pain Pain Assessment: 0-10 Pain Score: 8  Pain Location: L hip indication mid lateral femur (rubbiing area) Pain Descriptors / Indicators: Discomfort;Sore;Operative site guarding;Grimacing Pain Intervention(s): Monitored during session;Premedicated before session;Repositioned;Ice applied    Home Living                      Prior Function            PT Goals (current goals can now be found in the care plan section) Progress towards PT goals: Progressing toward goals    Frequency    7X/week      PT Plan Current plan remains appropriate    Co-evaluation               AM-PAC PT "6 Clicks" Mobility   Outcome Measure  Help needed turning from your back to your side while in a flat bed without using bedrails?: A Little Help needed moving from lying on your back to sitting on the side of a flat bed without using bedrails?: A Little Help needed moving to and from a bed to a chair (including a wheelchair)?: A Little Help needed standing up from a chair using your arms (e.g., wheelchair or bedside chair)?: A Little Help needed to walk in hospital room?: A Little Help needed climbing 3-5 steps with a railing? : A Lot 6 Click Score: 17    End of Session Equipment Utilized During Treatment: Gait belt Activity Tolerance: Patient limited by fatigue;Patient limited by pain Patient left: in bed;with call bell/phone within reach;with bed alarm set;with family/visitor present Nurse Communication: Mobility status PT Visit Diagnosis: Muscle weakness (generalized) (M62.81);Difficulty in walking, not elsewhere classified (R26.2)     Time: 1191-4782 PT Time Calculation (min) (ACUTE ONLY): 31 min  Charges:  $Gait Training: 8-22 mins $Therapeutic Activity: 8-22 mins                     Felecia Shelling  PTA Acute  Rehabilitation Services Pager      [phone] Office      [phone]

## 2020-11-15 NOTE — Plan of Care (Signed)
°  Problem: Education: °Goal: Knowledge of the prescribed therapeutic regimen will improve °Outcome: Progressing °  °Problem: Clinical Measurements: °Goal: Postoperative complications will be avoided or minimized °Outcome: Progressing °  °Problem: Pain Management: °Goal: Pain level will decrease with appropriate interventions °Outcome: Progressing °  °

## 2020-11-16 DIAGNOSIS — M1612 Unilateral primary osteoarthritis, left hip: Secondary | ICD-10-CM | POA: Diagnosis not present

## 2020-11-16 LAB — GLUCOSE, CAPILLARY
Glucose-Capillary: 100 mg/dL — ABNORMAL HIGH (ref 70–99)
Glucose-Capillary: 188 mg/dL — ABNORMAL HIGH (ref 70–99)
Glucose-Capillary: 196 mg/dL — ABNORMAL HIGH (ref 70–99)
Glucose-Capillary: 143 mg/dL — ABNORMAL HIGH (ref 70–99)

## 2020-11-16 NOTE — Progress Notes (Signed)
Physical Therapy Treatment Patient Details Name: [name] MRN: [id number] DOB: [birth year] Today's Date: 11/16/2020    History of Present Illness Patient is 80 y.o. female s/p Lt THA anterior approach on 11/12/20 with PMH significant for TIA's, seizure, MI, HTN, HLD, GERD, DM, CAD, back pain.    PT Comments    Pt tolerated significant increase in ambulation distance this session. She ambulated 4' with RW, no loss of balance, verbal cues for posture and sequencing.   Follow Up Recommendations  SNF     Equipment Recommendations  None recommended by PT    Recommendations for Other Services       Precautions / Restrictions Precautions Precautions: Fall Restrictions LLE Weight Bearing: Weight bearing as tolerated    Mobility  Bed Mobility Overal bed mobility: Needs Assistance Bed Mobility: Supine to Sit;Sit to Supine     Supine to sit: Modified independent (Device/Increase time);HOB elevated Sit to supine: Min assist   General bed mobility comments: min A for LLE into bed    Transfers Overall transfer level: Needs assistance Equipment used: Rolling walker (2 wheeled) Transfers: Sit to/from Stand Sit to Stand: Min guard         General transfer comment: VCs hand placement  Ambulation/Gait Ambulation/Gait assistance: Min guard Gait Distance (Feet): 110 Feet Assistive device: Rolling walker (2 wheeled) Gait Pattern/deviations: Step-to pattern;Decreased stance time - left Gait velocity: decreased   General Gait Details: steady, no loss of balance, VCs posture and sequencing   Stairs             Wheelchair Mobility    Modified Rankin (Stroke Patients Only)       Balance Overall balance assessment: Needs assistance Sitting-balance support: Feet supported;Bilateral upper extremity supported Sitting balance-Leahy Scale: Fair     Standing balance support: During functional activity;Single extremity supported Standing balance-Leahy Scale:  Poor Standing balance comment: stood for ~7 minutes to wash face and brush teeth                            Cognition Arousal/Alertness: Awake/alert Behavior During Therapy: WFL for tasks assessed/performed Overall Cognitive Status: Within Functional Limits for tasks assessed                                 General Comments: AxO x 3 very pleasant independent lady      Exercises Total Joint Exercises Ankle Circles/Pumps: AROM;Both;10 reps;Supine Short Arc Quad: AROM;Left;10 reps;Supine Heel Slides: AAROM;Left;10 reps;Supine    General Comments        Pertinent Vitals/Pain Pain Score: 4  Pain Location: L hip and thigh Pain Descriptors / Indicators: Discomfort;Sore;Operative site guarding;Grimacing    Home Living                      Prior Function            PT Goals (current goals can now be found in the care plan section) Acute Rehab PT Goals Patient Stated Goal: to walk around the neighborhood PT Goal Formulation: With patient Time For Goal Achievement: 11/19/20 Potential to Achieve Goals: Good Progress towards PT goals: Progressing toward goals    Frequency    7X/week      PT Plan Current plan remains appropriate    Co-evaluation              AM-PAC PT "6 Clicks" Mobility  Outcome Measure  Help needed turning from your back to your side while in a flat bed without using bedrails?: A Little Help needed moving from lying on your back to sitting on the side of a flat bed without using bedrails?: A Little Help needed moving to and from a bed to a chair (including a wheelchair)?: A Little Help needed standing up from a chair using your arms (e.g., wheelchair or bedside chair)?: A Little Help needed to walk in hospital room?: A Little Help needed climbing 3-5 steps with a railing? : A Lot 6 Click Score: 17    End of Session Equipment Utilized During Treatment: Gait belt Activity Tolerance: Patient tolerated  treatment well Patient left: in bed;with bed alarm set;with call bell/phone within reach Nurse Communication: Mobility status PT Visit Diagnosis: Muscle weakness (generalized) (M62.81);Difficulty in walking, not elsewhere classified (R26.2)     Time: 3338-3291 PT Time Calculation (min) (ACUTE ONLY): 32 min  Charges:  $Gait Training: 8-22 mins $Therapeutic Exercise: 8-22 mins                    Ralene Bathe Kistler PT 11/16/2020  Acute Rehabilitation Services Pager [phone] Office [phone]

## 2020-11-16 NOTE — Progress Notes (Signed)
Physical Therapy Treatment Patient Details Name: [name] MRN: [id number] DOB: [birth year] Today's Date: 11/16/2020    History of Present Illness Patient is 80 y.o. female s/p Lt THA anterior approach on 11/12/20 with PMH significant for TIA's, seizure, MI, HTN, HLD, GERD, DM, CAD, back pain.    PT Comments    Pt reports ongoing L hip and thigh pain. She ambulated 33' with RW, distance limited by pain. Pt performed L THA exercises with assist. Overall decreased ambulation distance today compared to yesterday. ST-SNF recommended.    Follow Up Recommendations  SNF     Equipment Recommendations  None recommended by PT    Recommendations for Other Services       Precautions / Restrictions Precautions Precautions: Fall Restrictions Weight Bearing Restrictions: No LLE Weight Bearing: Weight bearing as tolerated    Mobility  Bed Mobility               General bed mobility comments: up in recliner    Transfers Overall transfer level: Needs assistance Equipment used: Rolling walker (2 wheeled) Transfers: Sit to/from Stand Sit to Stand: Min assist         General transfer comment: min A to power up, VCs hand placement  Ambulation/Gait Ambulation/Gait assistance: Min guard Gait Distance (Feet): 15 Feet Assistive device: Rolling walker (2 wheeled) Gait Pattern/deviations: Step-to pattern;Decreased stance time - left Gait velocity: decreased   General Gait Details: distance limited by pain of 8/10 L hip with walking   Stairs             Wheelchair Mobility    Modified Rankin (Stroke Patients Only)       Balance Overall balance assessment: Needs assistance Sitting-balance support: Feet supported;Bilateral upper extremity supported Sitting balance-Leahy Scale: Fair     Standing balance support: During functional activity;Bilateral upper extremity supported Standing balance-Leahy Scale: Poor                               Cognition Arousal/Alertness: Awake/alert Behavior During Therapy: WFL for tasks assessed/performed Overall Cognitive Status: Within Functional Limits for tasks assessed                                 General Comments: AxO x 3 very pleasant independent lady      Exercises Total Joint Exercises Ankle Circles/Pumps: AROM;Both;10 reps;Supine Short Arc Quad: AROM;Left;10 reps;Supine Heel Slides: AAROM;Left;10 reps;Supine Hip ABduction/ADduction: AAROM;Left;10 reps;Supine    General Comments        Pertinent Vitals/Pain Pain Score: 8  Pain Location: L hip and thigh Pain Descriptors / Indicators: Discomfort;Sore;Operative site guarding;Grimacing Pain Intervention(s): Limited activity within patient's tolerance;Monitored during session;Premedicated before session;Ice applied    Home Living                      Prior Function            PT Goals (current goals can now be found in the care plan section) Acute Rehab PT Goals Patient Stated Goal: stop hurting and get recovered PT Goal Formulation: With patient Time For Goal Achievement: 11/19/20 Potential to Achieve Goals: Good Progress towards PT goals: Progressing toward goals    Frequency    7X/week      PT Plan Current plan remains appropriate    Co-evaluation  AM-PAC PT "6 Clicks" Mobility   Outcome Measure  Help needed turning from your back to your side while in a flat bed without using bedrails?: A Little Help needed moving from lying on your back to sitting on the side of a flat bed without using bedrails?: A Little Help needed moving to and from a bed to a chair (including a wheelchair)?: A Little Help needed standing up from a chair using your arms (e.g., wheelchair or bedside chair)?: A Little Help needed to walk in hospital room?: A Little Help needed climbing 3-5 steps with a railing? : A Lot 6 Click Score: 17    End of Session Equipment Utilized During  Treatment: Gait belt Activity Tolerance: Patient limited by fatigue;Patient limited by pain Patient left: with call bell/phone within reach;in chair;with chair alarm set Nurse Communication: Mobility status PT Visit Diagnosis: Muscle weakness (generalized) (M62.81);Difficulty in walking, not elsewhere classified (R26.2)     Time: 8242-3536 PT Time Calculation (min) (ACUTE ONLY): 23 min  Charges:  $Gait Training: 8-22 mins $Therapeutic Exercise: 8-22 mins                     Ralene Bathe Kistler PT 11/16/2020  Acute Rehabilitation Services Pager [phone] Office [phone]

## 2020-11-16 NOTE — Progress Notes (Signed)
     [name] is a 80 y.o. female   Orthopaedic diagnosis: Left total hip arthroplasty, postop day 4 by Dr. Jerl Santos  Subjective: Patient continues to have burning pain on the anterior thigh.  She reports that the pain is improving slowly.  She also notes a pulling type sensation when she tries to move.  She does not have much pain at rest.  Denies groin pain.  Patient notes previous spine surgeries and pre-existing right anterior thigh pain just not this severe.  She is concerned that it is related to her back.  She denies weakness.  She does have persistent numbness in her left leg from previous spine issues.   Objectyive: Vitals:   11/15/20 2213 11/16/20 0550  BP: (!) 155/77 132/80  Pulse: 73 77  Resp:  16  Temp:  98.1 F (36.7 C)  SpO2:  96%     Exam: Awake and alert Respirations even and unlabored No acute distress  Left lower extremity with normal resting position.  Her knee is straight.  Dressing intact without shadowing.  She has tenderness to palpation along the anterior thigh and some hypersensitivity to light touch.  She is able to dorsiflex and plantarflex her ankle as well as extend her hallux without obvious weakness.  She endorses light touch sensibility to the foot.  No pain with calf squeeze.  No pain to palpation of the knee.  She tolerates gentle passive motion of the hip without groin pain.  X-rays obtained yesterday of the left hip demonstrate well-seated hip prosthesis without evidence of fracturing or loosening.  Assessment: POD #4 Left THA with continued anterior thigh pain with previous lumbar fusion surgery and pre-existing anterior thigh pain.   Plan: She will continue current treatment for now.  X-rays of the left hip obtained yesterday did not show any findings concerning for fracturing about the prosthesis or loosening of the prosthesis.  It appears to be well-seated.  She will likely require skilled nursing facility placement due to the fact  that she lives alone and has pets.  She will continue work with physical therapy while in house.  She may benefit from having her spine evaluated as she was having some anterior thigh pain prior to being evaluated for her hip.  This may be an exacerbation of underlying radiculopathy.  She is weightbearing as tolerated on the left lower extremity. Continue to work with physical therapy Social work working on Architect.  She remains in the hospital due to continued pain and difficulty mobilizing.    Nicki Guadalajara, MD

## 2020-11-17 DIAGNOSIS — M1612 Unilateral primary osteoarthritis, left hip: Secondary | ICD-10-CM | POA: Diagnosis not present

## 2020-11-17 LAB — GLUCOSE, CAPILLARY
Glucose-Capillary: 110 mg/dL — ABNORMAL HIGH (ref 70–99)
Glucose-Capillary: 181 mg/dL — ABNORMAL HIGH (ref 70–99)
Glucose-Capillary: 164 mg/dL — ABNORMAL HIGH (ref 70–99)
Glucose-Capillary: 210 mg/dL — ABNORMAL HIGH (ref 70–99)

## 2020-11-17 NOTE — Progress Notes (Signed)
     [name] is a 80 y.o. female   Orthopaedic diagnosis: Left hip total arthroplasty, postop day 5 by Dr. Jerl Santos  Subjective: Patient states she is doing better today.  She continues to have the burning pain in the left anterior thigh but overall doing well.  She did mobilize some with physical therapy.  Objectyive: Vitals:   11/16/20 2248 11/17/20 0539  BP: (!) 157/86 (!) 154/79  Pulse: 77 71  Resp:  17  Temp:  99 F (37.2 C)  SpO2:  98%     Exam: Awake and alert Respirations even and unlabored No acute distress  Left thigh with continued pain on the anterior aspect of the thigh.  Tolerates gentle passive motion of the left hip.  Distally intact sensation about the dorsal and plantar foot.  She is able to range the ankle without difficulty.  Assessment: Postop day 5   Plan:  She is weightbearing as tolerated on the left lower extremity. Continue to work with physical therapy Social work working on Architect.  She remains in the hospital due to continued pain and difficulty mobilizing.   Nicki Guadalajara, MD

## 2020-11-17 NOTE — Progress Notes (Signed)
PT Cancellation Note  Patient Details Name: [name] MRN: [id number] DOB: [birth year]   Cancelled Treatment:    Reason Eval/Treat Not Completed:  Pt refuses to participate with therapy despite encouragement.    Faye Ramsay, PT Acute Rehabilitation  Office: [phone] Pager: [phone]

## 2020-11-17 NOTE — Plan of Care (Signed)
  Problem: Education: Goal: Knowledge of the prescribed therapeutic regimen will improve Outcome: Progressing   Problem: Pain Management: Goal: Pain level will decrease with appropriate interventions Outcome: Progressing   Problem: Activity: Goal: Ability to tolerate increased activity will improve Outcome: Progressing   

## 2020-11-17 NOTE — TOC Progression Note (Signed)
Transition of Care [facility]) - Progression Note    Patient Details  Name: [name] MRN: [id number] Date of Birth: October 28, 1940  Transition of Care [facility]) CM/SW Contact  Lennart Pall, LCSW Phone Number: 11/17/2020, 2:01 PM  Clinical Narrative:    Met with pt and son today to discuss SNF bed offers.  Have still only received one offer [facility].  Pt has accepted this bed.  Have contacted the facility and I spoke with Administrator who notes they cannot admit today but could plan for tomorrow.  Pt/son aware.  Have requested COVID test be ordered by MD>   Expected Discharge Plan: [place] Barriers to Discharge: SNF Pending bed offer  Expected Discharge Plan and Services Expected Discharge Plan: [place] Choice: [place]   Expected Discharge Date: 11/14/20               DME Arranged: N/A DME Agency: NA       HH Arranged: PT HH Agency: [facility] Date HH Agency Contacted: 11/13/20 Time [place]: 1204 Representative spoke with at [place]: [place] ([place]) Interventions    Readmission Risk Interventions No flowsheet data found.

## 2020-11-18 DIAGNOSIS — M1612 Unilateral primary osteoarthritis, left hip: Secondary | ICD-10-CM | POA: Diagnosis not present

## 2020-11-18 LAB — URINALYSIS, ROUTINE W REFLEX MICROSCOPIC
Bilirubin Urine: NEGATIVE
Glucose, UA: NEGATIVE mg/dL
Hgb urine dipstick: NEGATIVE
Ketones, ur: NEGATIVE mg/dL
Leukocytes,Ua: NEGATIVE
Nitrite: NEGATIVE
Protein, ur: NEGATIVE mg/dL
Specific Gravity, Urine: 1.008 (ref 1.005–1.030)
pH: 7 (ref 5.0–8.0)

## 2020-11-18 LAB — GLUCOSE, CAPILLARY
Glucose-Capillary: 139 mg/dL — ABNORMAL HIGH (ref 70–99)
Glucose-Capillary: 174 mg/dL — ABNORMAL HIGH (ref 70–99)
Glucose-Capillary: 205 mg/dL — ABNORMAL HIGH (ref 70–99)
Glucose-Capillary: 205 mg/dL — ABNORMAL HIGH (ref 70–99)
Glucose-Capillary: 219 mg/dL — ABNORMAL HIGH (ref 70–99)

## 2020-11-18 LAB — SARS CORONAVIRUS 2 (TAT 6-24 HRS): SARS Coronavirus 2: NEGATIVE

## 2020-11-18 MED ORDER — SODIUM CHLORIDE 0.9 % IV BOLUS
500.0000 mL | Freq: Once | INTRAVENOUS | Status: AC
  Administered 2020-11-18: 500 mL via INTRAVENOUS

## 2020-11-18 NOTE — Plan of Care (Signed)
  Problem: Clinical Measurements: Goal: Postoperative complications will be avoided or minimized Outcome: Progressing   Problem: Pain Management: Goal: Pain level will decrease with appropriate interventions Outcome: Progressing   

## 2020-11-18 NOTE — TOC Progression Note (Signed)
Transition of Care [facility]) - Progression Note   Patient Details  Name: [name] MRN: [id number] Date of Birth: Apr 10, 1941  Transition of Care [facility]) CM/SW Contact  Ewing Schlein, LCSW Phone Number: 11/18/2020, 2:33 PM  Clinical Narrative: CSW spoke with Janie at [facility]. Per Wille Celeste, [employer] authorization has not been completed and could take 24-72 hours to get approval. Patient's son to complete admission paperwork. CSW updated patient. COVID test is negative. TOC awaiting insurance authorization.  Expected Discharge Plan: Skilled Nursing Facility Barriers to Discharge: SNF Pending bed offer  Expected Discharge Plan and Services Expected Discharge Plan: Skilled Nursing Facility Post Acute Care Choice: Skilled Nursing Facility Expected Discharge Date: 11/18/20               DME Arranged: N/A DME Agency: NA HH Arranged: PT HH Agency: [facility] Date HH Agency Contacted: 11/13/20 Time HH Agency Contacted: 1204 Representative spoke with at [facility] Agency: [place]  Readmission Risk Interventions No flowsheet data found.

## 2020-11-18 NOTE — Progress Notes (Signed)
Physical Therapy Treatment Patient Details Name: [name] MRN: [id number] DOB: 1941/06/17 Today's Date: 11/18/2020    History of Present Illness Patient is 80 y.o. female s/p Lt THA anterior approach on 11/12/20 with PMH significant for TIA's, seizure, MI, HTN, HLD, GERD, DM, CAD, back pain.    PT Comments    Pt progressing with mobility. Plan is for SNF today   Follow Up Recommendations  SNF     Equipment Recommendations  None recommended by PT    Recommendations for Other Services       Precautions / Restrictions Precautions Precautions: Fall Restrictions Weight Bearing Restrictions: No LLE Weight Bearing: Weight bearing as tolerated    Mobility  Bed Mobility Overal bed mobility: Needs Assistance       Supine to sit: Min assist     General bed mobility comments: min A for LLE completely off bed    Transfers Overall transfer level: Needs assistance Equipment used: Rolling walker (2 wheeled) Transfers: Sit to/from Stand Sit to Stand: Min guard;Supervision         General transfer comment: VCs hand placement  Ambulation/Gait Ambulation/Gait assistance: Supervision;Min guard Gait Distance (Feet): 70 Feet (10') Assistive device: Rolling walker (2 wheeled) Gait Pattern/deviations: Step-to pattern;Decreased stance time - left Gait velocity: decreased   General Gait Details: cues to stay inside RW for turns, steady iwth RW support   Stairs             Wheelchair Mobility    Modified Rankin (Stroke Patients Only)       Balance                                            Cognition Arousal/Alertness: Awake/alert Behavior During Therapy: WFL for tasks assessed/performed Overall Cognitive Status: Within Functional Limits for tasks assessed                                        Exercises      General Comments        Pertinent Vitals/Pain Pain Assessment: Faces Faces Pain Scale: Hurts little  more Pain Location: L hip and thigh Pain Descriptors / Indicators: Burning Pain Intervention(s): Limited activity within patient's tolerance;Monitored during session;Premedicated before session;Repositioned    Home Living                      Prior Function            PT Goals (current goals can now be found in the care plan section) Acute Rehab PT Goals Patient Stated Goal: to walk around the neighborhood PT Goal Formulation: With patient Time For Goal Achievement: 11/19/20 Potential to Achieve Goals: Good Progress towards PT goals: Progressing toward goals    Frequency    7X/week      PT Plan Current plan remains appropriate    Co-evaluation              AM-PAC PT "6 Clicks" Mobility   Outcome Measure  Help needed turning from your back to your side while in a flat bed without using bedrails?: A Little Help needed moving from lying on your back to sitting on the side of a flat bed without using bedrails?: A Little Help needed moving to and from a bed to  a chair (including a wheelchair)?: A Little Help needed standing up from a chair using your arms (e.g., wheelchair or bedside chair)?: A Little Help needed to walk in hospital room?: A Little Help needed climbing 3-5 steps with a railing? : A Little 6 Click Score: 18    End of Session Equipment Utilized During Treatment: Gait belt Activity Tolerance: Patient tolerated treatment well Patient left: in chair;with call bell/phone within reach;with chair alarm set Nurse Communication: Mobility status PT Visit Diagnosis: Muscle weakness (generalized) (M62.81);Difficulty in walking, not elsewhere classified (R26.2)     Time: 1022-1050 PT Time Calculation (min) (ACUTE ONLY): 28 min  Charges:  $Gait Training: 23-37 mins                     Delice Bison, PT  Acute Rehab Dept ([facility]) [phone] Pager [phone]  11/18/2020    [facility] 11/18/2020, 11:07 AM

## 2020-11-18 NOTE — Progress Notes (Signed)
Pts son Maurine Minister has called twice. First time requesting pt to be started on an antidepressant, notified Elodia Florence, he messaged back that that will have to be gotten from pcp or dr at the facility she is going to.  Pts son called back and I explained this to him. He is concerned about pt having hallucinations and that she is not getting better. Concerns because she is not herself and has been combative with him and his brother. Notified Elodia Florence and he is going to call the pts son. UA and culture ordered also.

## 2020-11-18 NOTE — Progress Notes (Signed)
Subjective: 6 Days Post-Op Procedure(s) (LRB): LEFT TOTAL HIP ARTHROPLASTY ANTERIOR APPROACH (Left)   Patient is hoping to go to SNF today. She states that she didn't decline therapy yesterday but that she declined morning time therapy. She continues with no pain at rest and is walking better.  Activity level:  wbat Diet tolerance:  ok Voiding:  ok Patient reports pain as mild.    Objective: Vital signs in last 24 hours: Temp:  [98.1 F (36.7 C)-98.8 F (37.1 C)] 98.1 F (36.7 C) (05/09 0445) Pulse Rate:  [70-78] 78 (05/09 0445) Resp:  [16-17] 16 (05/09 0445) BP: (140-155)/(70-85) 154/85 (05/09 0445) SpO2:  [96 %-100 %] 96 % (05/09 0445)  Labs: Recent Labs    11/15/20 0952  HGB 9.1*   Recent Labs    11/15/20 0952  WBC 5.6  RBC 2.97*  HCT 28.3*  PLT 133*   Recent Labs    11/15/20 0952  NA 134*  K 4.5  CL 104  CO2 21*  BUN 18  CREATININE 1.08*  GLUCOSE 168*  CALCIUM 8.8*   No results for input(s): LABPT, INR in the last 72 hours.  Physical Exam:  Neurologically intact ABD soft Neurovascular intact Sensation intact distally Intact pulses distally Dorsiflexion/Plantar flexion intact Incision: dressing C/D/I and no drainage No cellulitis present Compartment soft  Assessment/Plan:  6 Days Post-Op Procedure(s) (LRB): LEFT TOTAL HIP ARTHROPLASTY ANTERIOR APPROACH (Left) Advance diet Up with therapy Discharge to SNF hopefully today. Continue on 81mg  asa BID for dvt prevention. Follow up in office 2 weeks post op. We will do a one time 500 cc saline bolus today as she feels dehydrated.  Kassidy Dockendorf 11/18/2020, 9:30 AM

## 2020-11-18 NOTE — Discharge Summary (Deleted)
Patient ID: [name] MRN: [id number] DOB/AGE: 01-20-1941 80 y.o.  Admit date: 11/12/2020 Discharge date: 11/18/2020  Admission Diagnoses:  Principal Problem:   Primary osteoarthritis of left hip   Discharge Diagnoses:  Same  Past Medical History:  Diagnosis Date  . Arthritis    lower back  . Chronic back pain    S/P MRI [id number] large left paracentral disc herniation  . Coronary artery disease   . Dizzy spells 04/13/2019   after a fall. dizzy when rolling over in bed or moving quickly  . DM2 (diabetes mellitus, type 2) (HCC)   . Dyspnea   . Family history of adverse reaction to anesthesia    Sister had PONV  . GERD (gastroesophageal reflux disease)   . Headache(784.0)    Carotid US/ Negative/[facility]  . Hiatal hernia   . Hyperlipidemia   . Hypertension   . Insomnia   . MI (myocardial infarction) (HCC) 2005   P stent x3 in 2008  . Numbness and tingling    left foot after back surgery  . Seizure (HCC)   . Stroke [facility])    "Mini strokes"  . TIA (transient ischemic attack) 2013   08-25-2011, 09-06-2011  . Vascular migraine     Surgeries: Procedure(s): LEFT TOTAL HIP ARTHROPLASTY ANTERIOR APPROACH on 11/12/2020   Consultants:   Discharged Condition: Improved  Hospital Course: FONDA ROCHON is an 80 y.o. female who was admitted 11/12/2020 for operative treatment ofPrimary osteoarthritis of left hip. Patient has severe unremitting pain that affects sleep, daily activities, and work/hobbies. After pre-op clearance the patient was taken to the operating room on 11/12/2020 and underwent  Procedure(s): LEFT TOTAL HIP ARTHROPLASTY ANTERIOR APPROACH.    Patient was given perioperative antibiotics:  Anti-infectives (From admission, onward)   Start     Dose/Rate Route Frequency Ordered Stop   11/12/20 1630  ceFAZolin (ANCEF) IVPB 2g/100 mL premix        2 g 200 mL/hr over 30 Minutes Intravenous Every 6 hours 11/12/20 1207 11/13/20 0200   11/12/20 0745   ceFAZolin (ANCEF) IVPB 2g/100 mL premix        2 g 200 mL/hr over 30 Minutes Intravenous On call to O.R. 11/12/20 0740 11/12/20 1028   11/12/20 0745  ceFAZolin (ANCEF) IVPB 2g/100 mL premix  Status:  Discontinued        2 g 200 mL/hr over 30 Minutes Intravenous On call to O.R. 11/12/20 0740 11/12/20 4098       Patient was given sequential compression devices, early ambulation, and chemoprophylaxis to prevent DVT.  Patient benefited maximally from hospital stay and there were no complications.    Recent vital signs:  Patient Vitals for the past 24 hrs:  BP Temp Temp src Pulse Resp SpO2  11/18/20 0445 (!) 154/85 98.1 F (36.7 C) Oral 78 16 96 %  11/17/20 2215 (!) 155/70 98.8 F (37.1 C) Oral 72 17 100 %  11/17/20 1348 140/75 98.3 F (36.8 C) Oral 70 16 97 %     Recent laboratory studies:  Recent Labs    11/15/20 0952  WBC 5.6  HGB 9.1*  HCT 28.3*  PLT 133*  NA 134*  K 4.5  CL 104  CO2 21*  BUN 18  CREATININE 1.08*  GLUCOSE 168*  CALCIUM 8.8*     Discharge Medications:   Allergies as of 11/18/2020      Reactions   Escitalopram Oxalate    Pt unsure of reaction   Byetta 5  Mcg Pen [exenatide] Nausea Only   Codeine Sulfate Other (See Comments)   Unknown    Metoprolol Tartrate Cough   Lipitor [atorvastatin] Palpitations   Felt like heart attack      Medication List    TAKE these medications   aspirin EC 81 MG tablet Take 1 tablet (81 mg total) by mouth 2 (two) times daily after a meal. What changed: when to take this   carvedilol 25 MG tablet Commonly known as: COREG Take 25 mg by mouth 2 (two) times daily with a meal.   glimepiride 4 MG tablet Commonly known as: AMARYL Take 4 mg 2 (two) times daily by mouth.   HYDROcodone-acetaminophen 5-325 MG tablet Commonly known as: NORCO/VICODIN Take 1-2 tablets by mouth every 6 (six) hours as needed for moderate pain or severe pain (post op pain). What changed: reasons to take this   levETIRAcetam 500 MG  tablet Commonly known as: KEPPRA Take 500 mg by mouth in the morning and at bedtime.   metFORMIN 500 MG tablet Commonly known as: GLUCOPHAGE Take 500 mg by mouth in the morning, at noon, and at bedtime.   pantoprazole 40 MG tablet Commonly known as: PROTONIX Take 1 tablet (40 mg total) by mouth daily. Take 1 tab twice a day. What changed:   when to take this  additional instructions   rosuvastatin 5 MG tablet Commonly known as: CRESTOR Take 5 mg by mouth in the morning and at bedtime.   tiZANidine 4 MG tablet Commonly known as: Zanaflex Take 1 tablet (4 mg total) by mouth every 6 (six) hours as needed for muscle spasms.            Durable Medical Equipment  (From admission, onward)         Start     Ordered   11/12/20 1830  DME Walker rolling  Once       Question:  Patient needs a walker to treat with the following condition  Answer:  Primary osteoarthritis of left hip   11/12/20 1829   11/12/20 1830  DME 3 n 1  Once        11/12/20 1829   11/12/20 1830  DME Bedside commode  Once       Question:  Patient needs a bedside commode to treat with the following condition  Answer:  Primary osteoarthritis of left hip   11/12/20 1829          Diagnostic Studies: DG C-Arm 1-60 Min-No Report  Result Date: 11/12/2020 Fluoroscopy was utilized by the requesting physician.  No radiographic interpretation.   DG HIP OPERATIVE UNILAT W OR W/O PELVIS LEFT  Result Date: 11/12/2020 CLINICAL DATA:  Left hip replacement EXAM: OPERATIVE LEFT HIP WITH PELVIS COMPARISON:  06/26/2020 FLUOROSCOPY TIME:  Radiation Exposure Index (as provided by the fluoroscopic device): 2.52 mGy If the device does not provide the exposure index: Fluoroscopy Time:  18 seconds Number of Acquired Images:  2 FINDINGS: Left hip prosthesis is noted in satisfactory position. No acute bony or soft tissue abnormality is noted. IMPRESSION: Status post left hip prosthesis. Electronically Signed   By: Alcide Clever M.D.    On: 11/12/2020 12:01   DG HIP UNILAT WITH PELVIS 2-3 VIEWS LEFT  Result Date: 11/15/2020 CLINICAL DATA:  Post LEFT hip arthroplasty, difficulty walking. Painful to touch. EXAM: DG HIP (WITH OR WITHOUT PELVIS) 2-3V LEFT COMPARISON:  Nov 12, 2020. FINDINGS: Post LEFT hip arthroplasty. Small amount of gas in the soft tissues of  the LEFT upper thigh and about the LEFT hip in this recent postoperative patient. Some gas present in soft tissues adjacent to the surgical site on previous imaging. No signs of acute fracture. Unchanged appearance of femoral and acetabular components compared to previous imaging. No sign of dislocation. IMPRESSION: Post LEFT hip arthroplasty without signs of hardware complication or acute fracture. Small amounts of gas in the soft tissues around the joint in this patient following recent surgery. Incidental note made of spinal fusion in the lower lumbar spine. Electronically Signed   By: Donzetta Kohut M.D.   On: 11/15/2020 09:01    Disposition: Discharge disposition: 03-Skilled Nursing Facility       Discharge Instructions    Call MD / Call 911   Complete by: As directed    If you experience chest pain or shortness of breath, CALL 911 and be transported to the hospital emergency room.  If you develope a fever above 101 F, pus (white drainage) or increased drainage or redness at the wound, or calf pain, call your surgeon's office.   Call MD / Call 911   Complete by: As directed    If you experience chest pain or shortness of breath, CALL 911 and be transported to the hospital emergency room.  If you develope a fever above 101 F, pus (white drainage) or increased drainage or redness at the wound, or calf pain, call your surgeon's office.   Call MD / Call 911   Complete by: As directed    If you experience chest pain or shortness of breath, CALL 911 and be transported to the hospital emergency room.  If you develope a fever above 101 F, pus (white drainage) or increased  drainage or redness at the wound, or calf pain, call your surgeon's office.   Call MD / Call 911   Complete by: As directed    If you experience chest pain or shortness of breath, CALL 911 and be transported to the hospital emergency room.  If you develope a fever above 101 F, pus (white drainage) or increased drainage or redness at the wound, or calf pain, call your surgeon's office.   Constipation Prevention   Complete by: As directed    Drink plenty of fluids.  Prune juice may be helpful.  You may use a stool softener, such as Colace (over the counter) 100 mg twice a day.  Use MiraLax (over the counter) for constipation as needed.   Constipation Prevention   Complete by: As directed    Drink plenty of fluids.  Prune juice may be helpful.  You may use a stool softener, such as Colace (over the counter) 100 mg twice a day.  Use MiraLax (over the counter) for constipation as needed.   Constipation Prevention   Complete by: As directed    Drink plenty of fluids.  Prune juice may be helpful.  You may use a stool softener, such as Colace (over the counter) 100 mg twice a day.  Use MiraLax (over the counter) for constipation as needed.   Constipation Prevention   Complete by: As directed    Drink plenty of fluids.  Prune juice may be helpful.  You may use a stool softener, such as Colace (over the counter) 100 mg twice a day.  Use MiraLax (over the counter) for constipation as needed.   Diet - low sodium heart healthy   Complete by: As directed    Diet - low sodium heart healthy   Complete  by: As directed    Diet - low sodium heart healthy   Complete by: As directed    Diet - low sodium heart healthy   Complete by: As directed    Discharge instructions   Complete by: As directed    INSTRUCTIONS AFTER JOINT REPLACEMENT   Remove items at home which could result in a fall. This includes throw rugs or furniture in walking pathways ICE to the affected joint every three hours while awake for 30  minutes at a time, for at least the first 3-5 days, and then as needed for pain and swelling.  Continue to use ice for pain and swelling. You may notice swelling that will progress down to the foot and ankle.  This is normal after surgery.  Elevate your leg when you are not up walking on it.   Continue to use the breathing machine you got in the hospital (incentive spirometer) which will help keep your temperature down.  It is common for your temperature to cycle up and down following surgery, especially at night when you are not up moving around and exerting yourself.  The breathing machine keeps your lungs expanded and your temperature down.   DIET:  As you were doing prior to hospitalization, we recommend a well-balanced diet.  DRESSING / WOUND CARE / SHOWERING  You may shower 3 days after surgery, but keep the wounds dry during showering.  You may use an occlusive plastic wrap (Press'n Seal for example), NO SOAKING/SUBMERGING IN THE BATHTUB.  If the bandage gets wet, change with a clean dry gauze.  If the incision gets wet, pat the wound dry with a clean towel.  ACTIVITY  Increase activity slowly as tolerated, but follow the weight bearing instructions below.   No driving for 6 weeks or until further direction given by your physician.  You cannot drive while taking narcotics.  No lifting or carrying greater than 10 lbs. until further directed by your surgeon. Avoid periods of inactivity such as sitting longer than an hour when not asleep. This helps prevent blood clots.  You may return to work once you are authorized by your doctor.     WEIGHT BEARING   Weight bearing as tolerated with assist device (walker, cane, etc) as directed, use it as long as suggested by your surgeon or therapist, typically at least 4-6 weeks.   EXERCISES  Results after joint replacement surgery are often greatly improved when you follow the exercise, range of motion and muscle strengthening exercises  prescribed by your doctor. Safety measures are also important to protect the joint from further injury. Any time any of these exercises cause you to have increased pain or swelling, decrease what you are doing until you are comfortable again and then slowly increase them. If you have problems or questions, call your caregiver or physical therapist for advice.   Rehabilitation is important following a joint replacement. After just a few days of immobilization, the muscles of the leg can become weakened and shrink (atrophy).  These exercises are designed to build up the tone and strength of the thigh and leg muscles and to improve motion. Often times heat used for twenty to thirty minutes before working out will loosen up your tissues and help with improving the range of motion but do not use heat for the first two weeks following surgery (sometimes heat can increase post-operative swelling).   These exercises can be done on a training (exercise) mat, on the floor, on a  table or on a bed. Use whatever works the best and is most comfortable for you.    Use music or television while you are exercising so that the exercises are a pleasant break in your day. This will make your life better with the exercises acting as a break in your routine that you can look forward to.   Perform all exercises about fifteen times, three times per day or as directed.  You should exercise both the operative leg and the other leg as well.  Exercises include:   Quad Sets - Tighten up the muscle on the front of the thigh (Quad) and hold for 5-10 seconds.   Straight Leg Raises - With your knee straight (if you were given a brace, keep it on), lift the leg to 60 degrees, hold for 3 seconds, and slowly lower the leg.  Perform this exercise against resistance later as your leg gets stronger.  Leg Slides: Lying on your back, slowly slide your foot toward your buttocks, bending your knee up off the floor (only go as far as is  comfortable). Then slowly slide your foot back down until your leg is flat on the floor again.  Angel Wings: Lying on your back spread your legs to the side as far apart as you can without causing discomfort.  Hamstring Strength:  Lying on your back, push your heel against the floor with your leg straight by tightening up the muscles of your buttocks.  Repeat, but this time bend your knee to a comfortable angle, and push your heel against the floor.  You may put a pillow under the heel to make it more comfortable if necessary.   A rehabilitation program following joint replacement surgery can speed recovery and prevent re-injury in the future due to weakened muscles. Contact your doctor or a physical therapist for more information on knee rehabilitation.    CONSTIPATION  Constipation is defined medically as fewer than three stools per week and severe constipation as less than one stool per week.  Even if you have a regular bowel pattern at home, your normal regimen is likely to be disrupted due to multiple reasons following surgery.  Combination of anesthesia, postoperative narcotics, change in appetite and fluid intake all can affect your bowels.   YOU MUST use at least one of the following options; they are listed in order of increasing strength to get the job done.  They are all available over the counter, and you may need to use some, POSSIBLY even all of these options:    Drink plenty of fluids (prune juice may be helpful) and high fiber foods Colace 100 mg by mouth twice a day  Senokot for constipation as directed and as needed Dulcolax (bisacodyl), take with full glass of water  Miralax (polyethylene glycol) once or twice a day as needed.  If you have tried all these things and are unable to have a bowel movement in the first 3-4 days after surgery call either your surgeon or your primary doctor.    If you experience loose stools or diarrhea, hold the medications until you stool forms back  up.  If your symptoms do not get better within 1 week or if they get worse, check with your doctor.  If you experience "the worst abdominal pain ever" or develop nausea or vomiting, please contact the office immediately for further recommendations for treatment.   ITCHING:  If you experience itching with your medications, try taking only a single pain  pill, or even half a pain pill at a time.  You can also use Benadryl over the counter for itching or also to help with sleep.   TED HOSE STOCKINGS:  Use stockings on both legs until for at least 2 weeks or as directed by physician office. They may be removed at night for sleeping.  MEDICATIONS:  See your medication summary on the "After Visit Summary" that nursing will review with you.  You may have some home medications which will be placed on hold until you complete the course of blood thinner medication.  It is important for you to complete the blood thinner medication as prescribed.  PRECAUTIONS:  If you experience chest pain or shortness of breath - call 911 immediately for transfer to the hospital emergency department.   If you develop a fever greater that 101 F, purulent drainage from wound, increased redness or drainage from wound, foul odor from the wound/dressing, or calf pain - CONTACT YOUR SURGEON.                                                   FOLLOW-UP APPOINTMENTS:  If you do not already have a post-op appointment, please call the office for an appointment to be seen by your surgeon.  Guidelines for how soon to be seen are listed in your "After Visit Summary", but are typically between 1-4 weeks after surgery.  OTHER INSTRUCTIONS:   Knee Replacement:  Do not place pillow under knee, focus on keeping the knee straight while resting. CPM instructions: 0-90 degrees, 2 hours in the morning, 2 hours in the afternoon, and 2 hours in the evening. Place foam block, curve side up under heel at all times except when in CPM or when walking.  DO  NOT modify, tear, cut, or change the foam block in any way.  POST-OPERATIVE OPIOID TAPER INSTRUCTIONS: It is important to wean off of your opioid medication as soon as possible. If you do not need pain medication after your surgery it is ok to stop day one. Opioids include: Codeine, Hydrocodone(Norco, Vicodin), Oxycodone(Percocet, oxycontin) and hydromorphone amongst others.  Long term and even short term use of opiods can cause: Increased pain response Dependence Constipation Depression Respiratory depression And more.  Withdrawal symptoms can include Flu like symptoms Nausea, vomiting And more Techniques to manage these symptoms Hydrate well Eat regular healthy meals Stay active Use relaxation techniques(deep breathing, meditating, yoga) Do Not substitute Alcohol to help with tapering If you have been on opioids for less than two weeks and do not have pain than it is ok to stop all together.  Plan to wean off of opioids This plan should start within one week post op of your joint replacement. Maintain the same interval or time between taking each dose and first decrease the dose.  Cut the total daily intake of opioids by one tablet each day Next start to increase the time between doses. The last dose that should be eliminated is the evening dose.     MAKE SURE YOU:  Understand these instructions.  Get help right away if you are not doing well or get worse.    Thank you for letting us be a part of your medical care team.  It is a privilege we respect greatly.  We hope these instructions will help you stay on track  for a fast and full recovery!   Discharge instructions   Complete by: As directed    INSTRUCTIONS AFTER JOINT REPLACEMENT   Remove items at home which could result in a fall. This includes throw rugs or furniture in walking pathways ICE to the affected joint every three hours while awake for 30 minutes at a time, for at least the first 3-5 days, and then as  needed for pain and swelling.  Continue to use ice for pain and swelling. You may notice swelling that will progress down to the foot and ankle.  This is normal after surgery.  Elevate your leg when you are not up walking on it.   Continue to use the breathing machine you got in the hospital (incentive spirometer) which will help keep your temperature down.  It is common for your temperature to cycle up and down following surgery, especially at night when you are not up moving around and exerting yourself.  The breathing machine keeps your lungs expanded and your temperature down.   DIET:  As you were doing prior to hospitalization, we recommend a well-balanced diet.  DRESSING / WOUND CARE / SHOWERING  You may shower 3 days after surgery, but keep the wounds dry during showering.  You may use an occlusive plastic wrap (Press'n Seal for example), NO SOAKING/SUBMERGING IN THE BATHTUB.  If the bandage gets wet, change with a clean dry gauze.  If the incision gets wet, pat the wound dry with a clean towel.  ACTIVITY  Increase activity slowly as tolerated, but follow the weight bearing instructions below.   No driving for 6 weeks or until further direction given by your physician.  You cannot drive while taking narcotics.  No lifting or carrying greater than 10 lbs. until further directed by your surgeon. Avoid periods of inactivity such as sitting longer than an hour when not asleep. This helps prevent blood clots.  You may return to work once you are authorized by your doctor.     WEIGHT BEARING   Weight bearing as tolerated with assist device (walker, cane, etc) as directed, use it as long as suggested by your surgeon or therapist, typically at least 4-6 weeks.   EXERCISES  Results after joint replacement surgery are often greatly improved when you follow the exercise, range of motion and muscle strengthening exercises prescribed by your doctor. Safety measures are also important to protect  the joint from further injury. Any time any of these exercises cause you to have increased pain or swelling, decrease what you are doing until you are comfortable again and then slowly increase them. If you have problems or questions, call your caregiver or physical therapist for advice.   Rehabilitation is important following a joint replacement. After just a few days of immobilization, the muscles of the leg can become weakened and shrink (atrophy).  These exercises are designed to build up the tone and strength of the thigh and leg muscles and to improve motion. Often times heat used for twenty to thirty minutes before working out will loosen up your tissues and help with improving the range of motion but do not use heat for the first two weeks following surgery (sometimes heat can increase post-operative swelling).   These exercises can be done on a training (exercise) mat, on the floor, on a table or on a bed. Use whatever works the best and is most comfortable for you.    Use music or television while you are exercising so  that the exercises are a pleasant break in your day. This will make your life better with the exercises acting as a break in your routine that you can look forward to.   Perform all exercises about fifteen times, three times per day or as directed.  You should exercise both the operative leg and the other leg as well.  Exercises include:   Quad Sets - Tighten up the muscle on the front of the thigh (Quad) and hold for 5-10 seconds.   Straight Leg Raises - With your knee straight (if you were given a brace, keep it on), lift the leg to 60 degrees, hold for 3 seconds, and slowly lower the leg.  Perform this exercise against resistance later as your leg gets stronger.  Leg Slides: Lying on your back, slowly slide your foot toward your buttocks, bending your knee up off the floor (only go as far as is comfortable). Then slowly slide your foot back down until your leg is flat on the  floor again.  Angel Wings: Lying on your back spread your legs to the side as far apart as you can without causing discomfort.  Hamstring Strength:  Lying on your back, push your heel against the floor with your leg straight by tightening up the muscles of your buttocks.  Repeat, but this time bend your knee to a comfortable angle, and push your heel against the floor.  You may put a pillow under the heel to make it more comfortable if necessary.   A rehabilitation program following joint replacement surgery can speed recovery and prevent re-injury in the future due to weakened muscles. Contact your doctor or a physical therapist for more information on knee rehabilitation.    CONSTIPATION  Constipation is defined medically as fewer than three stools per week and severe constipation as less than one stool per week.  Even if you have a regular bowel pattern at home, your normal regimen is likely to be disrupted due to multiple reasons following surgery.  Combination of anesthesia, postoperative narcotics, change in appetite and fluid intake all can affect your bowels.   YOU MUST use at least one of the following options; they are listed in order of increasing strength to get the job done.  They are all available over the counter, and you may need to use some, POSSIBLY even all of these options:    Drink plenty of fluids (prune juice may be helpful) and high fiber foods Colace 100 mg by mouth twice a day  Senokot for constipation as directed and as needed Dulcolax (bisacodyl), take with full glass of water  Miralax (polyethylene glycol) once or twice a day as needed.  If you have tried all these things and are unable to have a bowel movement in the first 3-4 days after surgery call either your surgeon or your primary doctor.    If you experience loose stools or diarrhea, hold the medications until you stool forms back up.  If your symptoms do not get better within 1 week or if they get worse, check  with your doctor.  If you experience "the worst abdominal pain ever" or develop nausea or vomiting, please contact the office immediately for further recommendations for treatment.   ITCHING:  If you experience itching with your medications, try taking only a single pain pill, or even half a pain pill at a time.  You can also use Benadryl over the counter for itching or also to help with sleep.  TED HOSE STOCKINGS:  Use stockings on both legs until for at least 2 weeks or as directed by physician office. They may be removed at night for sleeping.  MEDICATIONS:  See your medication summary on the "After Visit Summary" that nursing will review with you.  You may have some home medications which will be placed on hold until you complete the course of blood thinner medication.  It is important for you to complete the blood thinner medication as prescribed.  PRECAUTIONS:  If you experience chest pain or shortness of breath - call 911 immediately for transfer to the hospital emergency department.   If you develop a fever greater that 101 F, purulent drainage from wound, increased redness or drainage from wound, foul odor from the wound/dressing, or calf pain - CONTACT YOUR SURGEON.                                                   FOLLOW-UP APPOINTMENTS:  If you do not already have a post-op appointment, please call the office for an appointment to be seen by your surgeon.  Guidelines for how soon to be seen are listed in your "After Visit Summary", but are typically between 1-4 weeks after surgery.  OTHER INSTRUCTIONS:   Knee Replacement:  Do not place pillow under knee, focus on keeping the knee straight while resting. CPM instructions: 0-90 degrees, 2 hours in the morning, 2 hours in the afternoon, and 2 hours in the evening. Place foam block, curve side up under heel at all times except when in CPM or when walking.  DO NOT modify, tear, cut, or change the foam block in any way.  POST-OPERATIVE  OPIOID TAPER INSTRUCTIONS: It is important to wean off of your opioid medication as soon as possible. If you do not need pain medication after your surgery it is ok to stop day one. Opioids include: Codeine, Hydrocodone(Norco, Vicodin), Oxycodone(Percocet, oxycontin) and hydromorphone amongst others.  Long term and even short term use of opiods can cause: Increased pain response Dependence Constipation Depression Respiratory depression And more.  Withdrawal symptoms can include Flu like symptoms Nausea, vomiting And more Techniques to manage these symptoms Hydrate well Eat regular healthy meals Stay active Use relaxation techniques(deep breathing, meditating, yoga) Do Not substitute Alcohol to help with tapering If you have been on opioids for less than two weeks and do not have pain than it is ok to stop all together.  Plan to wean off of opioids This plan should start within one week post op of your joint replacement. Maintain the same interval or time between taking each dose and first decrease the dose.  Cut the total daily intake of opioids by one tablet each day Next start to increase the time between doses. The last dose that should be eliminated is the evening dose.     MAKE SURE YOU:  Understand these instructions.  Get help right away if you are not doing well or get worse.    Thank you for letting us be a part of your medical care team.  It is a privilege we respect greatly.  We hope these instructions will help you stay on track for a fast and full recovery!   Discharge instructions   Complete by: As directed    INSTRUCTIONS AFTER JOINT REPLACEMENT   Remove items at home  which could result in a fall. This includes throw rugs or furniture in walking pathways ICE to the affected joint every three hours while awake for 30 minutes at a time, for at least the first 3-5 days, and then as needed for pain and swelling.  Continue to use ice for pain and swelling. You may  notice swelling that will progress down to the foot and ankle.  This is normal after surgery.  Elevate your leg when you are not up walking on it.   Continue to use the breathing machine you got in the hospital (incentive spirometer) which will help keep your temperature down.  It is common for your temperature to cycle up and down following surgery, especially at night when you are not up moving around and exerting yourself.  The breathing machine keeps your lungs expanded and your temperature down.   DIET:  As you were doing prior to hospitalization, we recommend a well-balanced diet.  DRESSING / WOUND CARE / SHOWERING  You may shower 3 days after surgery, but keep the wounds dry during showering.  You may use an occlusive plastic wrap (Press'n Seal for example), NO SOAKING/SUBMERGING IN THE BATHTUB.  If the bandage gets wet, change with a clean dry gauze.  If the incision gets wet, pat the wound dry with a clean towel.  ACTIVITY  Increase activity slowly as tolerated, but follow the weight bearing instructions below.   No driving for 6 weeks or until further direction given by your physician.  You cannot drive while taking narcotics.  No lifting or carrying greater than 10 lbs. until further directed by your surgeon. Avoid periods of inactivity such as sitting longer than an hour when not asleep. This helps prevent blood clots.  You may return to work once you are authorized by your doctor.     WEIGHT BEARING   Weight bearing as tolerated with assist device (walker, cane, etc) as directed, use it as long as suggested by your surgeon or therapist, typically at least 4-6 weeks.   EXERCISES  Results after joint replacement surgery are often greatly improved when you follow the exercise, range of motion and muscle strengthening exercises prescribed by your doctor. Safety measures are also important to protect the joint from further injury. Any time any of these exercises cause you to have  increased pain or swelling, decrease what you are doing until you are comfortable again and then slowly increase them. If you have problems or questions, call your caregiver or physical therapist for advice.   Rehabilitation is important following a joint replacement. After just a few days of immobilization, the muscles of the leg can become weakened and shrink (atrophy).  These exercises are designed to build up the tone and strength of the thigh and leg muscles and to improve motion. Often times heat used for twenty to thirty minutes before working out will loosen up your tissues and help with improving the range of motion but do not use heat for the first two weeks following surgery (sometimes heat can increase post-operative swelling).   These exercises can be done on a training (exercise) mat, on the floor, on a table or on a bed. Use whatever works the best and is most comfortable for you.    Use music or television while you are exercising so that the exercises are a pleasant break in your day. This will make your life better with the exercises acting as a break in your routine that you can  look forward to.   Perform all exercises about fifteen times, three times per day or as directed.  You should exercise both the operative leg and the other leg as well.  Exercises include:   Quad Sets - Tighten up the muscle on the front of the thigh (Quad) and hold for 5-10 seconds.   Straight Leg Raises - With your knee straight (if you were given a brace, keep it on), lift the leg to 60 degrees, hold for 3 seconds, and slowly lower the leg.  Perform this exercise against resistance later as your leg gets stronger.  Leg Slides: Lying on your back, slowly slide your foot toward your buttocks, bending your knee up off the floor (only go as far as is comfortable). Then slowly slide your foot back down until your leg is flat on the floor again.  Angel Wings: Lying on your back spread your legs to the side as far  apart as you can without causing discomfort.  Hamstring Strength:  Lying on your back, push your heel against the floor with your leg straight by tightening up the muscles of your buttocks.  Repeat, but this time bend your knee to a comfortable angle, and push your heel against the floor.  You may put a pillow under the heel to make it more comfortable if necessary.   A rehabilitation program following joint replacement surgery can speed recovery and prevent re-injury in the future due to weakened muscles. Contact your doctor or a physical therapist for more information on knee rehabilitation.    CONSTIPATION  Constipation is defined medically as fewer than three stools per week and severe constipation as less than one stool per week.  Even if you have a regular bowel pattern at home, your normal regimen is likely to be disrupted due to multiple reasons following surgery.  Combination of anesthesia, postoperative narcotics, change in appetite and fluid intake all can affect your bowels.   YOU MUST use at least one of the following options; they are listed in order of increasing strength to get the job done.  They are all available over the counter, and you may need to use some, POSSIBLY even all of these options:    Drink plenty of fluids (prune juice may be helpful) and high fiber foods Colace 100 mg by mouth twice a day  Senokot for constipation as directed and as needed Dulcolax (bisacodyl), take with full glass of water  Miralax (polyethylene glycol) once or twice a day as needed.  If you have tried all these things and are unable to have a bowel movement in the first 3-4 days after surgery call either your surgeon or your primary doctor.    If you experience loose stools or diarrhea, hold the medications until you stool forms back up.  If your symptoms do not get better within 1 week or if they get worse, check with your doctor.  If you experience "the worst abdominal pain ever" or develop  nausea or vomiting, please contact the office immediately for further recommendations for treatment.   ITCHING:  If you experience itching with your medications, try taking only a single pain pill, or even half a pain pill at a time.  You can also use Benadryl over the counter for itching or also to help with sleep.   TED HOSE STOCKINGS:  Use stockings on both legs until for at least 2 weeks or as directed by physician office. They may be removed at night for  sleeping.  MEDICATIONS:  See your medication summary on the "After Visit Summary" that nursing will review with you.  You may have some home medications which will be placed on hold until you complete the course of blood thinner medication.  It is important for you to complete the blood thinner medication as prescribed.  PRECAUTIONS:  If you experience chest pain or shortness of breath - call 911 immediately for transfer to the hospital emergency department.   If you develop a fever greater that 101 F, purulent drainage from wound, increased redness or drainage from wound, foul odor from the wound/dressing, or calf pain - CONTACT YOUR SURGEON.                                                   FOLLOW-UP APPOINTMENTS:  If you do not already have a post-op appointment, please call the office for an appointment to be seen by your surgeon.  Guidelines for how soon to be seen are listed in your "After Visit Summary", but are typically between 1-4 weeks after surgery.  OTHER INSTRUCTIONS:   Knee Replacement:  Do not place pillow under knee, focus on keeping the knee straight while resting. CPM instructions: 0-90 degrees, 2 hours in the morning, 2 hours in the afternoon, and 2 hours in the evening. Place foam block, curve side up under heel at all times except when in CPM or when walking.  DO NOT modify, tear, cut, or change the foam block in any way.  POST-OPERATIVE OPIOID TAPER INSTRUCTIONS: It is important to wean off of your opioid medication as  soon as possible. If you do not need pain medication after your surgery it is ok to stop day one. Opioids include: Codeine, Hydrocodone(Norco, Vicodin), Oxycodone(Percocet, oxycontin) and hydromorphone amongst others.  Long term and even short term use of opiods can cause: Increased pain response Dependence Constipation Depression Respiratory depression And more.  Withdrawal symptoms can include Flu like symptoms Nausea, vomiting And more Techniques to manage these symptoms Hydrate well Eat regular healthy meals Stay active Use relaxation techniques(deep breathing, meditating, yoga) Do Not substitute Alcohol to help with tapering If you have been on opioids for less than two weeks and do not have pain than it is ok to stop all together.  Plan to wean off of opioids This plan should start within one week post op of your joint replacement. Maintain the same interval or time between taking each dose and first decrease the dose.  Cut the total daily intake of opioids by one tablet each day Next start to increase the time between doses. The last dose that should be eliminated is the evening dose.     MAKE SURE YOU:  Understand these instructions.  Get help right away if you are not doing well or get worse.    Thank you for letting us be a part of your medical care team.  It is a privilege we respect greatly.  We hope these instructions will help you stay on track for a fast and full recovery!   Discharge instructions   Complete by: As directed    INSTRUCTIONS AFTER JOINT REPLACEMENT   Remove items at home which could result in a fall. This includes throw rugs or furniture in walking pathways ICE to the affected joint every three hours while awake for 30 minutes  at a time, for at least the first 3-5 days, and then as needed for pain and swelling.  Continue to use ice for pain and swelling. You may notice swelling that will progress down to the foot and ankle.  This is normal after  surgery.  Elevate your leg when you are not up walking on it.   Continue to use the breathing machine you got in the hospital (incentive spirometer) which will help keep your temperature down.  It is common for your temperature to cycle up and down following surgery, especially at night when you are not up moving around and exerting yourself.  The breathing machine keeps your lungs expanded and your temperature down.   DIET:  As you were doing prior to hospitalization, we recommend a well-balanced diet.  DRESSING / WOUND CARE / SHOWERING  You may shower 3 days after surgery, but keep the wounds dry during showering.  You may use an occlusive plastic wrap (Press'n Seal for example), NO SOAKING/SUBMERGING IN THE BATHTUB.  If the bandage gets wet, change with a clean dry gauze.  If the incision gets wet, pat the wound dry with a clean towel.  ACTIVITY  Increase activity slowly as tolerated, but follow the weight bearing instructions below.   No driving for 6 weeks or until further direction given by your physician.  You cannot drive while taking narcotics.  No lifting or carrying greater than 10 lbs. until further directed by your surgeon. Avoid periods of inactivity such as sitting longer than an hour when not asleep. This helps prevent blood clots.  You may return to work once you are authorized by your doctor.     WEIGHT BEARING   Weight bearing as tolerated with assist device (walker, cane, etc) as directed, use it as long as suggested by your surgeon or therapist, typically at least 4-6 weeks.   EXERCISES  Results after joint replacement surgery are often greatly improved when you follow the exercise, range of motion and muscle strengthening exercises prescribed by your doctor. Safety measures are also important to protect the joint from further injury. Any time any of these exercises cause you to have increased pain or swelling, decrease what you are doing until you are comfortable  again and then slowly increase them. If you have problems or questions, call your caregiver or physical therapist for advice.   Rehabilitation is important following a joint replacement. After just a few days of immobilization, the muscles of the leg can become weakened and shrink (atrophy).  These exercises are designed to build up the tone and strength of the thigh and leg muscles and to improve motion. Often times heat used for twenty to thirty minutes before working out will loosen up your tissues and help with improving the range of motion but do not use heat for the first two weeks following surgery (sometimes heat can increase post-operative swelling).   These exercises can be done on a training (exercise) mat, on the floor, on a table or on a bed. Use whatever works the best and is most comfortable for you.    Use music or television while you are exercising so that the exercises are a pleasant break in your day. This will make your life better with the exercises acting as a break in your routine that you can look forward to.   Perform all exercises about fifteen times, three times per day or as directed.  You should exercise both the operative leg and the  other leg as well.  Exercises include:   Quad Sets - Tighten up the muscle on the front of the thigh (Quad) and hold for 5-10 seconds.   Straight Leg Raises - With your knee straight (if you were given a brace, keep it on), lift the leg to 60 degrees, hold for 3 seconds, and slowly lower the leg.  Perform this exercise against resistance later as your leg gets stronger.  Leg Slides: Lying on your back, slowly slide your foot toward your buttocks, bending your knee up off the floor (only go as far as is comfortable). Then slowly slide your foot back down until your leg is flat on the floor again.  Angel Wings: Lying on your back spread your legs to the side as far apart as you can without causing discomfort.  Hamstring Strength:  Lying on your  back, push your heel against the floor with your leg straight by tightening up the muscles of your buttocks.  Repeat, but this time bend your knee to a comfortable angle, and push your heel against the floor.  You may put a pillow under the heel to make it more comfortable if necessary.   A rehabilitation program following joint replacement surgery can speed recovery and prevent re-injury in the future due to weakened muscles. Contact your doctor or a physical therapist for more information on knee rehabilitation.    CONSTIPATION  Constipation is defined medically as fewer than three stools per week and severe constipation as less than one stool per week.  Even if you have a regular bowel pattern at home, your normal regimen is likely to be disrupted due to multiple reasons following surgery.  Combination of anesthesia, postoperative narcotics, change in appetite and fluid intake all can affect your bowels.   YOU MUST use at least one of the following options; they are listed in order of increasing strength to get the job done.  They are all available over the counter, and you may need to use some, POSSIBLY even all of these options:    Drink plenty of fluids (prune juice may be helpful) and high fiber foods Colace 100 mg by mouth twice a day  Senokot for constipation as directed and as needed Dulcolax (bisacodyl), take with full glass of water  Miralax (polyethylene glycol) once or twice a day as needed.  If you have tried all these things and are unable to have a bowel movement in the first 3-4 days after surgery call either your surgeon or your primary doctor.    If you experience loose stools or diarrhea, hold the medications until you stool forms back up.  If your symptoms do not get better within 1 week or if they get worse, check with your doctor.  If you experience "the worst abdominal pain ever" or develop nausea or vomiting, please contact the office immediately for further  recommendations for treatment.   ITCHING:  If you experience itching with your medications, try taking only a single pain pill, or even half a pain pill at a time.  You can also use Benadryl over the counter for itching or also to help with sleep.   TED HOSE STOCKINGS:  Use stockings on both legs until for at least 2 weeks or as directed by physician office. They may be removed at night for sleeping.  MEDICATIONS:  See your medication summary on the "After Visit Summary" that nursing will review with you.  You may have some home medications which will  be placed on hold until you complete the course of blood thinner medication.  It is important for you to complete the blood thinner medication as prescribed.  PRECAUTIONS:  If you experience chest pain or shortness of breath - call 911 immediately for transfer to the hospital emergency department.   If you develop a fever greater that 101 F, purulent drainage from wound, increased redness or drainage from wound, foul odor from the wound/dressing, or calf pain - CONTACT YOUR SURGEON.                                                   FOLLOW-UP APPOINTMENTS:  If you do not already have a post-op appointment, please call the office for an appointment to be seen by your surgeon.  Guidelines for how soon to be seen are listed in your "After Visit Summary", but are typically between 1-4 weeks after surgery.  OTHER INSTRUCTIONS:   Knee Replacement:  Do not place pillow under knee, focus on keeping the knee straight while resting. CPM instructions: 0-90 degrees, 2 hours in the morning, 2 hours in the afternoon, and 2 hours in the evening. Place foam block, curve side up under heel at all times except when in CPM or when walking.  DO NOT modify, tear, cut, or change the foam block in any way.  POST-OPERATIVE OPIOID TAPER INSTRUCTIONS: It is important to wean off of your opioid medication as soon as possible. If you do not need pain medication after your  surgery it is ok to stop day one. Opioids include: Codeine, Hydrocodone(Norco, Vicodin), Oxycodone(Percocet, oxycontin) and hydromorphone amongst others.  Long term and even short term use of opiods can cause: Increased pain response Dependence Constipation Depression Respiratory depression And more.  Withdrawal symptoms can include Flu like symptoms Nausea, vomiting And more Techniques to manage these symptoms Hydrate well Eat regular healthy meals Stay active Use relaxation techniques(deep breathing, meditating, yoga) Do Not substitute Alcohol to help with tapering If you have been on opioids for less than two weeks and do not have pain than it is ok to stop all together.  Plan to wean off of opioids This plan should start within one week post op of your joint replacement. Maintain the same interval or time between taking each dose and first decrease the dose.  Cut the total daily intake of opioids by one tablet each day Next start to increase the time between doses. The last dose that should be eliminated is the evening dose.     MAKE SURE YOU:  Understand these instructions.  Get help right away if you are not doing well or get worse.    Thank you for letting us be a part of your medical care team.  It is a privilege we respect greatly.  We hope these instructions will help you stay on track for a fast and full recovery!   Increase activity slowly as tolerated   Complete by: As directed    Increase activity slowly as tolerated   Complete by: As directed    Increase activity slowly as tolerated   Complete by: As directed    Increase activity slowly as tolerated   Complete by: As directed    Post-operative opioid taper instructions:   Complete by: As directed    POST-OPERATIVE OPIOID TAPER INSTRUCTIONS: It is important to  wean off of your opioid medication as soon as possible. If you do not need pain medication after your surgery it is ok to stop day one. Opioids  include: Codeine, Hydrocodone(Norco, Vicodin), Oxycodone(Percocet, oxycontin) and hydromorphone amongst others.  Long term and even short term use of opiods can cause: Increased pain response Dependence Constipation Depression Respiratory depression And more.  Withdrawal symptoms can include Flu like symptoms Nausea, vomiting And more Techniques to manage these symptoms Hydrate well Eat regular healthy meals Stay active Use relaxation techniques(deep breathing, meditating, yoga) Do Not substitute Alcohol to help with tapering If you have been on opioids for less than two weeks and do not have pain than it is ok to stop all together.  Plan to wean off of opioids This plan should start within one week post op of your joint replacement. Maintain the same interval or time between taking each dose and first decrease the dose.  Cut the total daily intake of opioids by one tablet each day Next start to increase the time between doses. The last dose that should be eliminated is the evening dose.      Post-operative opioid taper instructions:   Complete by: As directed    POST-OPERATIVE OPIOID TAPER INSTRUCTIONS: It is important to wean off of your opioid medication as soon as possible. If you do not need pain medication after your surgery it is ok to stop day one. Opioids include: Codeine, Hydrocodone(Norco, Vicodin), Oxycodone(Percocet, oxycontin) and hydromorphone amongst others.  Long term and even short term use of opiods can cause: Increased pain response Dependence Constipation Depression Respiratory depression And more.  Withdrawal symptoms can include Flu like symptoms Nausea, vomiting And more Techniques to manage these symptoms Hydrate well Eat regular healthy meals Stay active Use relaxation techniques(deep breathing, meditating, yoga) Do Not substitute Alcohol to help with tapering If you have been on opioids for less than two weeks and do not have pain than it  is ok to stop all together.  Plan to wean off of opioids This plan should start within one week post op of your joint replacement. Maintain the same interval or time between taking each dose and first decrease the dose.  Cut the total daily intake of opioids by one tablet each day Next start to increase the time between doses. The last dose that should be eliminated is the evening dose.      Post-operative opioid taper instructions:   Complete by: As directed    POST-OPERATIVE OPIOID TAPER INSTRUCTIONS: It is important to wean off of your opioid medication as soon as possible. If you do not need pain medication after your surgery it is ok to stop day one. Opioids include: Codeine, Hydrocodone(Norco, Vicodin), Oxycodone(Percocet, oxycontin) and hydromorphone amongst others.  Long term and even short term use of opiods can cause: Increased pain response Dependence Constipation Depression Respiratory depression And more.  Withdrawal symptoms can include Flu like symptoms Nausea, vomiting And more Techniques to manage these symptoms Hydrate well Eat regular healthy meals Stay active Use relaxation techniques(deep breathing, meditating, yoga) Do Not substitute Alcohol to help with tapering If you have been on opioids for less than two weeks and do not have pain than it is ok to stop all together.  Plan to wean off of opioids This plan should start within one week post op of your joint replacement. Maintain the same interval or time between taking each dose and first decrease the dose.  Cut the total daily intake of opioids  by one tablet each day Next start to increase the time between doses. The last dose that should be eliminated is the evening dose.      Post-operative opioid taper instructions:   Complete by: As directed    POST-OPERATIVE OPIOID TAPER INSTRUCTIONS: It is important to wean off of your opioid medication as soon as possible. If you do not need pain medication  after your surgery it is ok to stop day one. Opioids include: Codeine, Hydrocodone(Norco, Vicodin), Oxycodone(Percocet, oxycontin) and hydromorphone amongst others.  Long term and even short term use of opiods can cause: Increased pain response Dependence Constipation Depression Respiratory depression And more.  Withdrawal symptoms can include Flu like symptoms Nausea, vomiting And more Techniques to manage these symptoms Hydrate well Eat regular healthy meals Stay active Use relaxation techniques(deep breathing, meditating, yoga) Do Not substitute Alcohol to help with tapering If you have been on opioids for less than two weeks and do not have pain than it is ok to stop all together.  Plan to wean off of opioids This plan should start within one week post op of your joint replacement. Maintain the same interval or time between taking each dose and first decrease the dose.  Cut the total daily intake of opioids by one tablet each day Next start to increase the time between doses. The last dose that should be eliminated is the evening dose.          Contact information for follow-up providers    Marcene Corning, MD. Schedule an appointment as soon as possible for a visit in 2 weeks.   Specialty: Orthopedic Surgery Contact information: [address] [place] [address] [phone]        [facility] Follow up.   Specialty: Home Health Services Why: PT Contact information: [address] [place] [address] [phone]            Contact information for after-discharge care    Destination    [facility] Preferred SNF .   Service: Skilled Nursing Contact information: [address] [place] [address] [phone]                   Signed: Ginger Organ Shakiyah Cirilo 11/18/2020, 9:34 AM

## 2020-11-18 NOTE — Plan of Care (Signed)
Plan of care discussed with pt.

## 2020-11-18 NOTE — Progress Notes (Signed)
PA called and said pt will go home tomorrow evening instead of snf. Pt has someone who can come over to help her. Her son also can spend the night with her.

## 2020-11-19 ENCOUNTER — Encounter (HOSPITAL_COMMUNITY): Payer: Self-pay | Admitting: Orthopaedic Surgery

## 2020-11-19 DIAGNOSIS — M1612 Unilateral primary osteoarthritis, left hip: Secondary | ICD-10-CM | POA: Diagnosis not present

## 2020-11-19 LAB — BASIC METABOLIC PANEL
Anion gap: 9 (ref 5–15)
BUN: 12 mg/dL (ref 8–23)
CO2: 23 mmol/L (ref 22–32)
Calcium: 9 mg/dL (ref 8.9–10.3)
Chloride: 105 mmol/L (ref 98–111)
Creatinine, Ser: 0.93 mg/dL (ref 0.44–1.00)
GFR, Estimated: >60 mL/min (ref >60)
Glucose, Bld: 185 mg/dL — ABNORMAL HIGH (ref 70–99)
Potassium: 3.6 mmol/L (ref 3.5–5.1)
Sodium: 137 mmol/L (ref 135–145)

## 2020-11-19 LAB — CBC WITH DIFFERENTIAL/PLATELET
Abs Immature Granulocytes: 0.02 10*3/uL (ref 0.00–0.07)
Basophils Absolute: 0 10*3/uL (ref 0.0–0.1)
Basophils Relative: 1 %
Eosinophils Absolute: 0.1 10*3/uL (ref 0.0–0.5)
Eosinophils Relative: 3 %
HCT: 28.4 % — ABNORMAL LOW (ref 36.0–46.0)
Hemoglobin: 9.2 g/dL — ABNORMAL LOW (ref 12.0–15.0)
Immature Granulocytes: 0 %
Lymphocytes Relative: 30 %
Lymphs Abs: 1.5 10*3/uL (ref 0.7–4.0)
MCH: 30.4 pg (ref 26.0–34.0)
MCHC: 32.4 g/dL (ref 30.0–36.0)
MCV: 93.7 fL (ref 80.0–100.0)
Monocytes Absolute: 0.5 10*3/uL (ref 0.1–1.0)
Monocytes Relative: 10 %
Neutro Abs: 2.9 10*3/uL (ref 1.7–7.7)
Neutrophils Relative %: 56 %
Platelets: 239 10*3/uL (ref 150–400)
RBC: 3.03 MIL/uL — ABNORMAL LOW (ref 3.87–5.11)
RDW: 12.9 % (ref 11.5–15.5)
WBC: 5 10*3/uL (ref 4.0–10.5)
nRBC: 0 % (ref 0.0–0.2)

## 2020-11-19 LAB — GLUCOSE, CAPILLARY
Glucose-Capillary: 164 mg/dL — ABNORMAL HIGH (ref 70–99)
Glucose-Capillary: 204 mg/dL — ABNORMAL HIGH (ref 70–99)

## 2020-11-19 NOTE — Progress Notes (Signed)
   11/19/20 1215  PT Visit Information  Last PT Received On 11/18/20  Assistance Needed +1  History of Present Illness Patient is 80 y.o. female s/p Lt THA anterior approach on 11/12/20 with PMH significant for TIA's, seizure, MI, HTN, HLD, GERD, DM, CAD, back pain.  Subjective Data  Patient Stated Goal to walk around the neighborhood  Precautions  Precautions Fall  Restrictions  LLE Weight Bearing WBAT  Pain Assessment  Pain Assessment Faces  Faces Pain Scale 4  Pain Location L hip and thigh  Pain Descriptors / Indicators Burning  Cognition  Arousal/Alertness Awake/alert  Behavior During Therapy WFL for tasks assessed/performed  Overall Cognitive Status Within Functional Limits for tasks assessed  Bed Mobility  Overal bed mobility Needs Assistance  Bed Mobility Sit to Supine  Supine to sit Min assist;Min guard  General bed mobility comments ligth assist  for LLE completely on to bed  Transfers  Overall transfer level Needs assistance  Equipment used Rolling walker (2 wheeled)  Transfers Sit to/from Stand  Sit to Stand Min guard;Supervision  General transfer comment VCs hand placement  Ambulation/Gait  Ambulation/Gait assistance Supervision;Min guard  Gait Distance (Feet) 15 Feet (x 2)  Assistive device Rolling walker (2 wheeled)  Gait Pattern/deviations Step-to pattern;Decreased stance time - left  General Gait Details cues to stay inside RW for turns, steady iwth RW support  Gait velocity decreased  Total Joint Exercises  Ankle Circles/Pumps AROM;Both;10 reps;Supine  PT - End of Session  Equipment Utilized During Treatment Gait belt  Activity Tolerance Patient tolerated treatment well  Patient left with call bell/phone within reach;in bed;with bed alarm set  Nurse Communication Mobility status   PT - Assessment/Plan  PT Plan Current plan remains appropriate  PT Visit Diagnosis Muscle weakness (generalized) (M62.81);Difficulty in walking, not elsewhere classified  (R26.2)  PT Frequency (ACUTE ONLY) 7X/week  Follow Up Recommendations SNF  PT equipment None recommended by PT  AM-PAC PT "6 Clicks" Mobility Outcome Measure (Version 2)  Help needed turning from your back to your side while in a flat bed without using bedrails? 3  Help needed moving from lying on your back to sitting on the side of a flat bed without using bedrails? 3  Help needed moving to and from a bed to a chair (including a wheelchair)? 3  Help needed standing up from a chair using your arms (e.g., wheelchair or bedside chair)? 3  Help needed to walk in hospital room? 3  Help needed climbing 3-5 steps with a railing?  3  6 Click Score 18  Consider Recommendation of Discharge To: Home with [facility]  PT Goal Progression  Progress towards PT goals Progressing toward goals  Acute Rehab PT Goals  PT Goal Formulation With patient  Time For Goal Achievement 11/19/20  Potential to Achieve Goals Good  PT Time Calculation  PT Start Time (ACUTE ONLY) 1155  PT Stop Time (ACUTE ONLY) 1212  PT Time Calculation (min) (ACUTE ONLY) 17 min

## 2020-11-19 NOTE — Progress Notes (Signed)
Physical Therapy Treatment Patient Details Name: [name] MRN: [id number] DOB: 1940/11/17 Today's Date: 11/19/2020    History of Present Illness Patient is 80 y.o. female s/p Lt THA anterior approach on 11/12/20 with PMH significant for TIA's, seizure, MI, HTN, HLD, GERD, DM, CAD, back pain.    PT Comments    Pt progression well with mobility. Mod I for bed mobility, supervision for transfers, supervision to mod I for amb x 120., no LOB. Will benefit from HHPT.  Pt is considering hiring assist at home initially.   Follow Up Recommendations  HHPT     Equipment Recommendations  None recommended by PT    Recommendations for Other Services       Precautions / Restrictions Precautions Precautions: Fall Restrictions LLE Weight Bearing: Weight bearing as tolerated    Mobility  Bed Mobility Overal bed mobility: Needs Assistance       Supine to sit: Min assist;Modified independent (Device/Increase time)     General bed mobility comments: no physical assist, use of rail from flat bed, pt has rail at home    Transfers Overall transfer level: Needs assistance Equipment used: Rolling walker (2 wheeled) Transfers: Sit to/from Stand Sit to Stand: Supervision;Modified independent (Device/Increase time)         General transfer comment: VCs hand placement, 2 attempts however no physical assist  Ambulation/Gait Ambulation/Gait assistance: Supervision;Modified independent (Device/Increase time) Gait Distance (Feet): 120 Feet Assistive device: Rolling walker (2 wheeled) Gait Pattern/deviations: Step-through pattern;Decreased stride length Gait velocity: decreased   General Gait Details: steady, no LOB, cues for posture and progression   Stairs             Wheelchair Mobility    Modified Rankin (Stroke Patients Only)       Balance             Standing balance-Leahy Scale: Fair                              Cognition Arousal/Alertness:  Awake/alert Behavior During Therapy: WFL for tasks assessed/performed Overall Cognitive Status: Within Functional Limits for tasks assessed                                        Exercises      General Comments        Pertinent Vitals/Pain Pain Assessment: Faces Faces Pain Scale: Hurts a little bit Pain Location: L hip and thigh Pain Descriptors / Indicators: Burning Pain Intervention(s): Limited activity within patient's tolerance;Monitored during session;Premedicated before session;Repositioned    Home Living                      Prior Function            PT Goals (current goals can now be found in the care plan section) Acute Rehab PT Goals Patient Stated Goal: to walk around the neighborhood PT Goal Formulation: With patient Time For Goal Achievement: 11/19/20 Potential to Achieve Goals: Good Progress towards PT goals: Progressing toward goals    Frequency    7X/week      PT Plan Current plan remains appropriate    Co-evaluation              AM-PAC PT "6 Clicks" Mobility   Outcome Measure  Help needed turning from your back to your side while  in a flat bed without using bedrails?: None Help needed moving from lying on your back to sitting on the side of a flat bed without using bedrails?: None Help needed moving to and from a bed to a chair (including a wheelchair)?: A Little Help needed standing up from a chair using your arms (e.g., wheelchair or bedside chair)?: A Little Help needed to walk in hospital room?: A Little Help needed climbing 3-5 steps with a railing? : A Little 6 Click Score: 20    End of Session Equipment Utilized During Treatment: Gait belt Activity Tolerance: Patient tolerated treatment well Patient left: in chair;with call bell/phone within reach;with chair alarm set Nurse Communication: Mobility status PT Visit Diagnosis: Muscle weakness (generalized) (M62.81);Difficulty in walking, not elsewhere  classified (R26.2)     Time: 2703-5009 PT Time Calculation (min) (ACUTE ONLY): 14 min  Charges:  $Gait Training: 8-22 mins                     Delice Bison, PT  Acute Rehab Dept ([facility]) [phone] Pager [phone]  11/19/2020    [facility] 11/19/2020, 11:13 AM

## 2020-11-19 NOTE — Discharge Summary (Signed)
Patient ID: KYANNA MAHRT MRN: [id number] DOB/AGE: Aug 29, 1940 80 y.o.  Admit date: 11/12/2020 Discharge date: 11/19/2020  Admission Diagnoses:  Principal Problem:   Primary osteoarthritis of left hip   Discharge Diagnoses:  Same  Past Medical History:  Diagnosis Date  . Arthritis    lower back  . Chronic back pain    S/P MRI [id number] large left paracentral disc herniation  . Coronary artery disease   . Dizzy spells 04/13/2019   after a fall. dizzy when rolling over in bed or moving quickly  . DM2 (diabetes mellitus, type 2) (HCC)   . Dyspnea   . Family history of adverse reaction to anesthesia    Sister had PONV  . GERD (gastroesophageal reflux disease)   . Headache(784.0)    Carotid US/ Negative/[facility]  . Hiatal hernia   . Hyperlipidemia   . Hypertension   . Insomnia   . MI (myocardial infarction) (HCC) 2005   P stent x3 in 2008  . Numbness and tingling    left foot after back surgery  . Seizure (HCC)   . Stroke [facility])    "Mini strokes"  . TIA (transient ischemic attack) 2013   08-25-2011, 09-06-2011  . Vascular migraine     Surgeries: Procedure(s): LEFT TOTAL HIP ARTHROPLASTY ANTERIOR APPROACH on 11/12/2020   Consultants:   Discharged Condition: Improved  Hospital Course: RANDE DARIO is an 80 y.o. female who was admitted 11/12/2020 for operative treatment ofPrimary osteoarthritis of left hip. Patient has severe unremitting pain that affects sleep, daily activities, and work/hobbies. After pre-op clearance the patient was taken to the operating room on 11/12/2020 and underwent  Procedure(s): LEFT TOTAL HIP ARTHROPLASTY ANTERIOR APPROACH.    Patient was given perioperative antibiotics:  Anti-infectives (From admission, onward)   Start     Dose/Rate Route Frequency Ordered Stop   11/12/20 1630  ceFAZolin (ANCEF) IVPB 2g/100 mL premix        2 g 200 mL/hr over 30 Minutes Intravenous Every 6 hours 11/12/20 1207 11/13/20 0200   11/12/20 0745   ceFAZolin (ANCEF) IVPB 2g/100 mL premix        2 g 200 mL/hr over 30 Minutes Intravenous On call to O.R. 11/12/20 0740 11/12/20 1028   11/12/20 0745  ceFAZolin (ANCEF) IVPB 2g/100 mL premix  Status:  Discontinued        2 g 200 mL/hr over 30 Minutes Intravenous On call to O.R. 11/12/20 0740 11/12/20 1308       Patient was given sequential compression devices, early ambulation, and chemoprophylaxis to prevent DVT.  Patient benefited maximally from hospital stay and there were no complications.    Recent vital signs:  Patient Vitals for the past 24 hrs:  BP Temp Temp src Pulse Resp SpO2  11/19/20 0534 (!) 149/68 97.9 F (36.6 C) Oral 80 15 100 %  11/18/20 2059 (!) 152/80 97.6 F (36.4 C) Oral 84 16 100 %  11/18/20 1740 (!) 142/81 97.6 F (36.4 C) Oral 82 -- 100 %     Recent laboratory studies:  Recent Labs    11/19/20 0345  WBC 5.0  HGB 9.2*  HCT 28.4*  PLT 239  NA 137  K 3.6  CL 105  CO2 23  BUN 12  CREATININE 0.93  GLUCOSE 185*  CALCIUM 9.0     Discharge Medications:   Allergies as of 11/19/2020      Reactions   Escitalopram Oxalate    Pt unsure of reaction   Byetta  5 Mcg Pen [exenatide] Nausea Only   Codeine Sulfate Other (See Comments)   Unknown    Metoprolol Tartrate Cough   Lipitor [atorvastatin] Palpitations   Felt like heart attack      Medication List    TAKE these medications   aspirin EC 81 MG tablet Take 1 tablet (81 mg total) by mouth 2 (two) times daily after a meal. What changed: when to take this   carvedilol 25 MG tablet Commonly known as: COREG Take 25 mg by mouth 2 (two) times daily with a meal.   glimepiride 4 MG tablet Commonly known as: AMARYL Take 4 mg 2 (two) times daily by mouth.   HYDROcodone-acetaminophen 5-325 MG tablet Commonly known as: NORCO/VICODIN Take 1-2 tablets by mouth every 6 (six) hours as needed for moderate pain or severe pain (post op pain). What changed: reasons to take this   levETIRAcetam 500 MG  tablet Commonly known as: KEPPRA Take 500 mg by mouth in the morning and at bedtime.   metFORMIN 500 MG tablet Commonly known as: GLUCOPHAGE Take 500 mg by mouth in the morning, at noon, and at bedtime.   pantoprazole 40 MG tablet Commonly known as: PROTONIX Take 1 tablet (40 mg total) by mouth daily. Take 1 tab twice a day. What changed:   when to take this  additional instructions   rosuvastatin 5 MG tablet Commonly known as: CRESTOR Take 5 mg by mouth in the morning and at bedtime.   tiZANidine 4 MG tablet Commonly known as: Zanaflex Take 1 tablet (4 mg total) by mouth every 6 (six) hours as needed for muscle spasms.            Durable Medical Equipment  (From admission, onward)         Start     Ordered   11/12/20 1830  DME Walker rolling  Once       Question:  Patient needs a walker to treat with the following condition  Answer:  Primary osteoarthritis of left hip   11/12/20 1829   11/12/20 1830  DME 3 n 1  Once        11/12/20 1829   11/12/20 1830  DME Bedside commode  Once       Question:  Patient needs a bedside commode to treat with the following condition  Answer:  Primary osteoarthritis of left hip   11/12/20 1829          Diagnostic Studies: DG C-Arm 1-60 Min-No Report  Result Date: 11/12/2020 Fluoroscopy was utilized by the requesting physician.  No radiographic interpretation.   DG HIP OPERATIVE UNILAT W OR W/O PELVIS LEFT  Result Date: 11/12/2020 CLINICAL DATA:  Left hip replacement EXAM: OPERATIVE LEFT HIP WITH PELVIS COMPARISON:  06/26/2020 FLUOROSCOPY TIME:  Radiation Exposure Index (as provided by the fluoroscopic device): 2.52 mGy If the device does not provide the exposure index: Fluoroscopy Time:  18 seconds Number of Acquired Images:  2 FINDINGS: Left hip prosthesis is noted in satisfactory position. No acute bony or soft tissue abnormality is noted. IMPRESSION: Status post left hip prosthesis. Electronically Signed   By: Alcide Clever M.D.    On: 11/12/2020 12:01   DG HIP UNILAT WITH PELVIS 2-3 VIEWS LEFT  Result Date: 11/15/2020 CLINICAL DATA:  Post LEFT hip arthroplasty, difficulty walking. Painful to touch. EXAM: DG HIP (WITH OR WITHOUT PELVIS) 2-3V LEFT COMPARISON:  Nov 12, 2020. FINDINGS: Post LEFT hip arthroplasty. Small amount of gas in the soft tissues  of the LEFT upper thigh and about the LEFT hip in this recent postoperative patient. Some gas present in soft tissues adjacent to the surgical site on previous imaging. No signs of acute fracture. Unchanged appearance of femoral and acetabular components compared to previous imaging. No sign of dislocation. IMPRESSION: Post LEFT hip arthroplasty without signs of hardware complication or acute fracture. Small amounts of gas in the soft tissues around the joint in this patient following recent surgery. Incidental note made of spinal fusion in the lower lumbar spine. Electronically Signed   By: Donzetta Kohut M.D.   On: 11/15/2020 09:01    Disposition: Discharge disposition: 01-Home or Self Care       Discharge Instructions    Call MD / Call 911   Complete by: As directed    If you experience chest pain or shortness of breath, CALL 911 and be transported to the hospital emergency room.  If you develope a fever above 101 F, pus (white drainage) or increased drainage or redness at the wound, or calf pain, call your surgeon's office.   Call MD / Call 911   Complete by: As directed    If you experience chest pain or shortness of breath, CALL 911 and be transported to the hospital emergency room.  If you develope a fever above 101 F, pus (white drainage) or increased drainage or redness at the wound, or calf pain, call your surgeon's office.   Call MD / Call 911   Complete by: As directed    If you experience chest pain or shortness of breath, CALL 911 and be transported to the hospital emergency room.  If you develope a fever above 101 F, pus (white drainage) or increased drainage or  redness at the wound, or calf pain, call your surgeon's office.   Call MD / Call 911   Complete by: As directed    If you experience chest pain or shortness of breath, CALL 911 and be transported to the hospital emergency room.  If you develope a fever above 101 F, pus (white drainage) or increased drainage or redness at the wound, or calf pain, call your surgeon's office.   Call MD / Call 911   Complete by: As directed    If you experience chest pain or shortness of breath, CALL 911 and be transported to the hospital emergency room.  If you develope a fever above 101 F, pus (white drainage) or increased drainage or redness at the wound, or calf pain, call your surgeon's office.   Constipation Prevention   Complete by: As directed    Drink plenty of fluids.  Prune juice may be helpful.  You may use a stool softener, such as Colace (over the counter) 100 mg twice a day.  Use MiraLax (over the counter) for constipation as needed.   Constipation Prevention   Complete by: As directed    Drink plenty of fluids.  Prune juice may be helpful.  You may use a stool softener, such as Colace (over the counter) 100 mg twice a day.  Use MiraLax (over the counter) for constipation as needed.   Constipation Prevention   Complete by: As directed    Drink plenty of fluids.  Prune juice may be helpful.  You may use a stool softener, such as Colace (over the counter) 100 mg twice a day.  Use MiraLax (over the counter) for constipation as needed.   Constipation Prevention   Complete by: As directed  Drink plenty of fluids.  Prune juice may be helpful.  You may use a stool softener, such as Colace (over the counter) 100 mg twice a day.  Use MiraLax (over the counter) for constipation as needed.   Constipation Prevention   Complete by: As directed    Drink plenty of fluids.  Prune juice may be helpful.  You may use a stool softener, such as Colace (over the counter) 100 mg twice a day.  Use MiraLax (over the  counter) for constipation as needed.   Diet - low sodium heart healthy   Complete by: As directed    Diet - low sodium heart healthy   Complete by: As directed    Diet - low sodium heart healthy   Complete by: As directed    Diet - low sodium heart healthy   Complete by: As directed    Diet - low sodium heart healthy   Complete by: As directed    Discharge instructions   Complete by: As directed    INSTRUCTIONS AFTER JOINT REPLACEMENT   Remove items at home which could result in a fall. This includes throw rugs or furniture in walking pathways ICE to the affected joint every three hours while awake for 30 minutes at a time, for at least the first 3-5 days, and then as needed for pain and swelling.  Continue to use ice for pain and swelling. You may notice swelling that will progress down to the foot and ankle.  This is normal after surgery.  Elevate your leg when you are not up walking on it.   Continue to use the breathing machine you got in the hospital (incentive spirometer) which will help keep your temperature down.  It is common for your temperature to cycle up and down following surgery, especially at night when you are not up moving around and exerting yourself.  The breathing machine keeps your lungs expanded and your temperature down.   DIET:  As you were doing prior to hospitalization, we recommend a well-balanced diet.  DRESSING / WOUND CARE / SHOWERING  You may shower 3 days after surgery, but keep the wounds dry during showering.  You may use an occlusive plastic wrap (Press'n Seal for example), NO SOAKING/SUBMERGING IN THE BATHTUB.  If the bandage gets wet, change with a clean dry gauze.  If the incision gets wet, pat the wound dry with a clean towel.  ACTIVITY  Increase activity slowly as tolerated, but follow the weight bearing instructions below.   No driving for 6 weeks or until further direction given by your physician.  You cannot drive while taking narcotics.  No  lifting or carrying greater than 10 lbs. until further directed by your surgeon. Avoid periods of inactivity such as sitting longer than an hour when not asleep. This helps prevent blood clots.  You may return to work once you are authorized by your doctor.     WEIGHT BEARING   Weight bearing as tolerated with assist device (walker, cane, etc) as directed, use it as long as suggested by your surgeon or therapist, typically at least 4-6 weeks.   EXERCISES  Results after joint replacement surgery are often greatly improved when you follow the exercise, range of motion and muscle strengthening exercises prescribed by your doctor. Safety measures are also important to protect the joint from further injury. Any time any of these exercises cause you to have increased pain or swelling, decrease what you are doing until you are  comfortable again and then slowly increase them. If you have problems or questions, call your caregiver or physical therapist for advice.   Rehabilitation is important following a joint replacement. After just a few days of immobilization, the muscles of the leg can become weakened and shrink (atrophy).  These exercises are designed to build up the tone and strength of the thigh and leg muscles and to improve motion. Often times heat used for twenty to thirty minutes before working out will loosen up your tissues and help with improving the range of motion but do not use heat for the first two weeks following surgery (sometimes heat can increase post-operative swelling).   These exercises can be done on a training (exercise) mat, on the floor, on a table or on a bed. Use whatever works the best and is most comfortable for you.    Use music or television while you are exercising so that the exercises are a pleasant break in your day. This will make your life better with the exercises acting as a break in your routine that you can look forward to.   Perform all exercises about fifteen  times, three times per day or as directed.  You should exercise both the operative leg and the other leg as well.  Exercises include:   Quad Sets - Tighten up the muscle on the front of the thigh (Quad) and hold for 5-10 seconds.   Straight Leg Raises - With your knee straight (if you were given a brace, keep it on), lift the leg to 60 degrees, hold for 3 seconds, and slowly lower the leg.  Perform this exercise against resistance later as your leg gets stronger.  Leg Slides: Lying on your back, slowly slide your foot toward your buttocks, bending your knee up off the floor (only go as far as is comfortable). Then slowly slide your foot back down until your leg is flat on the floor again.  Angel Wings: Lying on your back spread your legs to the side as far apart as you can without causing discomfort.  Hamstring Strength:  Lying on your back, push your heel against the floor with your leg straight by tightening up the muscles of your buttocks.  Repeat, but this time bend your knee to a comfortable angle, and push your heel against the floor.  You may put a pillow under the heel to make it more comfortable if necessary.   A rehabilitation program following joint replacement surgery can speed recovery and prevent re-injury in the future due to weakened muscles. Contact your doctor or a physical therapist for more information on knee rehabilitation.    CONSTIPATION  Constipation is defined medically as fewer than three stools per week and severe constipation as less than one stool per week.  Even if you have a regular bowel pattern at home, your normal regimen is likely to be disrupted due to multiple reasons following surgery.  Combination of anesthesia, postoperative narcotics, change in appetite and fluid intake all can affect your bowels.   YOU MUST use at least one of the following options; they are listed in order of increasing strength to get the job done.  They are all available over the  counter, and you may need to use some, POSSIBLY even all of these options:    Drink plenty of fluids (prune juice may be helpful) and high fiber foods Colace 100 mg by mouth twice a day  Senokot for constipation as directed and as needed  Dulcolax (bisacodyl), take with full glass of water  Miralax (polyethylene glycol) once or twice a day as needed.  If you have tried all these things and are unable to have a bowel movement in the first 3-4 days after surgery call either your surgeon or your primary doctor.    If you experience loose stools or diarrhea, hold the medications until you stool forms back up.  If your symptoms do not get better within 1 week or if they get worse, check with your doctor.  If you experience "the worst abdominal pain ever" or develop nausea or vomiting, please contact the office immediately for further recommendations for treatment.   ITCHING:  If you experience itching with your medications, try taking only a single pain pill, or even half a pain pill at a time.  You can also use Benadryl over the counter for itching or also to help with sleep.   TED HOSE STOCKINGS:  Use stockings on both legs until for at least 2 weeks or as directed by physician office. They may be removed at night for sleeping.  MEDICATIONS:  See your medication summary on the "After Visit Summary" that nursing will review with you.  You may have some home medications which will be placed on hold until you complete the course of blood thinner medication.  It is important for you to complete the blood thinner medication as prescribed.  PRECAUTIONS:  If you experience chest pain or shortness of breath - call 911 immediately for transfer to the hospital emergency department.   If you develop a fever greater that 101 F, purulent drainage from wound, increased redness or drainage from wound, foul odor from the wound/dressing, or calf pain - CONTACT YOUR SURGEON.                                                    FOLLOW-UP APPOINTMENTS:  If you do not already have a post-op appointment, please call the office for an appointment to be seen by your surgeon.  Guidelines for how soon to be seen are listed in your "After Visit Summary", but are typically between 1-4 weeks after surgery.  OTHER INSTRUCTIONS:   Knee Replacement:  Do not place pillow under knee, focus on keeping the knee straight while resting. CPM instructions: 0-90 degrees, 2 hours in the morning, 2 hours in the afternoon, and 2 hours in the evening. Place foam block, curve side up under heel at all times except when in CPM or when walking.  DO NOT modify, tear, cut, or change the foam block in any way.  POST-OPERATIVE OPIOID TAPER INSTRUCTIONS: It is important to wean off of your opioid medication as soon as possible. If you do not need pain medication after your surgery it is ok to stop day one. Opioids include: Codeine, Hydrocodone(Norco, Vicodin), Oxycodone(Percocet, oxycontin) and hydromorphone amongst others.  Long term and even short term use of opiods can cause: Increased pain response Dependence Constipation Depression Respiratory depression And more.  Withdrawal symptoms can include Flu like symptoms Nausea, vomiting And more Techniques to manage these symptoms Hydrate well Eat regular healthy meals Stay active Use relaxation techniques(deep breathing, meditating, yoga) Do Not substitute Alcohol to help with tapering If you have been on opioids for less than two weeks and do not have pain than it is ok to  stop all together.  Plan to wean off of opioids This plan should start within one week post op of your joint replacement. Maintain the same interval or time between taking each dose and first decrease the dose.  Cut the total daily intake of opioids by one tablet each day Next start to increase the time between doses. The last dose that should be eliminated is the evening dose.     MAKE SURE YOU:  Understand  these instructions.  Get help right away if you are not doing well or get worse.    Thank you for letting us be a part of your medical care team.  It is a privilege we respect greatly.  We hope these instructions will help you stay on track for a fast and full recovery!   Discharge instructions   Complete by: As directed    INSTRUCTIONS AFTER JOINT REPLACEMENT   Remove items at home which could result in a fall. This includes throw rugs or furniture in walking pathways ICE to the affected joint every three hours while awake for 30 minutes at a time, for at least the first 3-5 days, and then as needed for pain and swelling.  Continue to use ice for pain and swelling. You may notice swelling that will progress down to the foot and ankle.  This is normal after surgery.  Elevate your leg when you are not up walking on it.   Continue to use the breathing machine you got in the hospital (incentive spirometer) which will help keep your temperature down.  It is common for your temperature to cycle up and down following surgery, especially at night when you are not up moving around and exerting yourself.  The breathing machine keeps your lungs expanded and your temperature down.   DIET:  As you were doing prior to hospitalization, we recommend a well-balanced diet.  DRESSING / WOUND CARE / SHOWERING  You may shower 3 days after surgery, but keep the wounds dry during showering.  You may use an occlusive plastic wrap (Press'n Seal for example), NO SOAKING/SUBMERGING IN THE BATHTUB.  If the bandage gets wet, change with a clean dry gauze.  If the incision gets wet, pat the wound dry with a clean towel.  ACTIVITY  Increase activity slowly as tolerated, but follow the weight bearing instructions below.   No driving for 6 weeks or until further direction given by your physician.  You cannot drive while taking narcotics.  No lifting or carrying greater than 10 lbs. until further directed by your  surgeon. Avoid periods of inactivity such as sitting longer than an hour when not asleep. This helps prevent blood clots.  You may return to work once you are authorized by your doctor.     WEIGHT BEARING   Weight bearing as tolerated with assist device (walker, cane, etc) as directed, use it as long as suggested by your surgeon or therapist, typically at least 4-6 weeks.   EXERCISES  Results after joint replacement surgery are often greatly improved when you follow the exercise, range of motion and muscle strengthening exercises prescribed by your doctor. Safety measures are also important to protect the joint from further injury. Any time any of these exercises cause you to have increased pain or swelling, decrease what you are doing until you are comfortable again and then slowly increase them. If you have problems or questions, call your caregiver or physical therapist for advice.   Rehabilitation is important following a  joint replacement. After just a few days of immobilization, the muscles of the leg can become weakened and shrink (atrophy).  These exercises are designed to build up the tone and strength of the thigh and leg muscles and to improve motion. Often times heat used for twenty to thirty minutes before working out will loosen up your tissues and help with improving the range of motion but do not use heat for the first two weeks following surgery (sometimes heat can increase post-operative swelling).   These exercises can be done on a training (exercise) mat, on the floor, on a table or on a bed. Use whatever works the best and is most comfortable for you.    Use music or television while you are exercising so that the exercises are a pleasant break in your day. This will make your life better with the exercises acting as a break in your routine that you can look forward to.   Perform all exercises about fifteen times, three times per day or as directed.  You should exercise both the  operative leg and the other leg as well.  Exercises include:   Quad Sets - Tighten up the muscle on the front of the thigh (Quad) and hold for 5-10 seconds.   Straight Leg Raises - With your knee straight (if you were given a brace, keep it on), lift the leg to 60 degrees, hold for 3 seconds, and slowly lower the leg.  Perform this exercise against resistance later as your leg gets stronger.  Leg Slides: Lying on your back, slowly slide your foot toward your buttocks, bending your knee up off the floor (only go as far as is comfortable). Then slowly slide your foot back down until your leg is flat on the floor again.  Angel Wings: Lying on your back spread your legs to the side as far apart as you can without causing discomfort.  Hamstring Strength:  Lying on your back, push your heel against the floor with your leg straight by tightening up the muscles of your buttocks.  Repeat, but this time bend your knee to a comfortable angle, and push your heel against the floor.  You may put a pillow under the heel to make it more comfortable if necessary.   A rehabilitation program following joint replacement surgery can speed recovery and prevent re-injury in the future due to weakened muscles. Contact your doctor or a physical therapist for more information on knee rehabilitation.    CONSTIPATION  Constipation is defined medically as fewer than three stools per week and severe constipation as less than one stool per week.  Even if you have a regular bowel pattern at home, your normal regimen is likely to be disrupted due to multiple reasons following surgery.  Combination of anesthesia, postoperative narcotics, change in appetite and fluid intake all can affect your bowels.   YOU MUST use at least one of the following options; they are listed in order of increasing strength to get the job done.  They are all available over the counter, and you may need to use some, POSSIBLY even all of these options:     Drink plenty of fluids (prune juice may be helpful) and high fiber foods Colace 100 mg by mouth twice a day  Senokot for constipation as directed and as needed Dulcolax (bisacodyl), take with full glass of water  Miralax (polyethylene glycol) once or twice a day as needed.  If you have tried all these things and  are unable to have a bowel movement in the first 3-4 days after surgery call either your surgeon or your primary doctor.    If you experience loose stools or diarrhea, hold the medications until you stool forms back up.  If your symptoms do not get better within 1 week or if they get worse, check with your doctor.  If you experience "the worst abdominal pain ever" or develop nausea or vomiting, please contact the office immediately for further recommendations for treatment.   ITCHING:  If you experience itching with your medications, try taking only a single pain pill, or even half a pain pill at a time.  You can also use Benadryl over the counter for itching or also to help with sleep.   TED HOSE STOCKINGS:  Use stockings on both legs until for at least 2 weeks or as directed by physician office. They may be removed at night for sleeping.  MEDICATIONS:  See your medication summary on the "After Visit Summary" that nursing will review with you.  You may have some home medications which will be placed on hold until you complete the course of blood thinner medication.  It is important for you to complete the blood thinner medication as prescribed.  PRECAUTIONS:  If you experience chest pain or shortness of breath - call 911 immediately for transfer to the hospital emergency department.   If you develop a fever greater that 101 F, purulent drainage from wound, increased redness or drainage from wound, foul odor from the wound/dressing, or calf pain - CONTACT YOUR SURGEON.                                                   FOLLOW-UP APPOINTMENTS:  If you do not already have a post-op  appointment, please call the office for an appointment to be seen by your surgeon.  Guidelines for how soon to be seen are listed in your "After Visit Summary", but are typically between 1-4 weeks after surgery.  OTHER INSTRUCTIONS:   Knee Replacement:  Do not place pillow under knee, focus on keeping the knee straight while resting. CPM instructions: 0-90 degrees, 2 hours in the morning, 2 hours in the afternoon, and 2 hours in the evening. Place foam block, curve side up under heel at all times except when in CPM or when walking.  DO NOT modify, tear, cut, or change the foam block in any way.  POST-OPERATIVE OPIOID TAPER INSTRUCTIONS: It is important to wean off of your opioid medication as soon as possible. If you do not need pain medication after your surgery it is ok to stop day one. Opioids include: Codeine, Hydrocodone(Norco, Vicodin), Oxycodone(Percocet, oxycontin) and hydromorphone amongst others.  Long term and even short term use of opiods can cause: Increased pain response Dependence Constipation Depression Respiratory depression And more.  Withdrawal symptoms can include Flu like symptoms Nausea, vomiting And more Techniques to manage these symptoms Hydrate well Eat regular healthy meals Stay active Use relaxation techniques(deep breathing, meditating, yoga) Do Not substitute Alcohol to help with tapering If you have been on opioids for less than two weeks and do not have pain than it is ok to stop all together.  Plan to wean off of opioids This plan should start within one week post op of your joint replacement. Maintain the same interval or  time between taking each dose and first decrease the dose.  Cut the total daily intake of opioids by one tablet each day Next start to increase the time between doses. The last dose that should be eliminated is the evening dose.     MAKE SURE YOU:  Understand these instructions.  Get help right away if you are not doing well  or get worse.    Thank you for letting us be a part of your medical care team.  It is a privilege we respect greatly.  We hope these instructions will help you stay on track for a fast and full recovery!   Discharge instructions   Complete by: As directed    INSTRUCTIONS AFTER JOINT REPLACEMENT   Remove items at home which could result in a fall. This includes throw rugs or furniture in walking pathways ICE to the affected joint every three hours while awake for 30 minutes at a time, for at least the first 3-5 days, and then as needed for pain and swelling.  Continue to use ice for pain and swelling. You may notice swelling that will progress down to the foot and ankle.  This is normal after surgery.  Elevate your leg when you are not up walking on it.   Continue to use the breathing machine you got in the hospital (incentive spirometer) which will help keep your temperature down.  It is common for your temperature to cycle up and down following surgery, especially at night when you are not up moving around and exerting yourself.  The breathing machine keeps your lungs expanded and your temperature down.   DIET:  As you were doing prior to hospitalization, we recommend a well-balanced diet.  DRESSING / WOUND CARE / SHOWERING  You may shower 3 days after surgery, but keep the wounds dry during showering.  You may use an occlusive plastic wrap (Press'n Seal for example), NO SOAKING/SUBMERGING IN THE BATHTUB.  If the bandage gets wet, change with a clean dry gauze.  If the incision gets wet, pat the wound dry with a clean towel.  ACTIVITY  Increase activity slowly as tolerated, but follow the weight bearing instructions below.   No driving for 6 weeks or until further direction given by your physician.  You cannot drive while taking narcotics.  No lifting or carrying greater than 10 lbs. until further directed by your surgeon. Avoid periods of inactivity such as sitting longer than an hour when  not asleep. This helps prevent blood clots.  You may return to work once you are authorized by your doctor.     WEIGHT BEARING   Weight bearing as tolerated with assist device (walker, cane, etc) as directed, use it as long as suggested by your surgeon or therapist, typically at least 4-6 weeks.   EXERCISES  Results after joint replacement surgery are often greatly improved when you follow the exercise, range of motion and muscle strengthening exercises prescribed by your doctor. Safety measures are also important to protect the joint from further injury. Any time any of these exercises cause you to have increased pain or swelling, decrease what you are doing until you are comfortable again and then slowly increase them. If you have problems or questions, call your caregiver or physical therapist for advice.   Rehabilitation is important following a joint replacement. After just a few days of immobilization, the muscles of the leg can become weakened and shrink (atrophy).  These exercises are designed to build up  the tone and strength of the thigh and leg muscles and to improve motion. Often times heat used for twenty to thirty minutes before working out will loosen up your tissues and help with improving the range of motion but do not use heat for the first two weeks following surgery (sometimes heat can increase post-operative swelling).   These exercises can be done on a training (exercise) mat, on the floor, on a table or on a bed. Use whatever works the best and is most comfortable for you.    Use music or television while you are exercising so that the exercises are a pleasant break in your day. This will make your life better with the exercises acting as a break in your routine that you can look forward to.   Perform all exercises about fifteen times, three times per day or as directed.  You should exercise both the operative leg and the other leg as well.  Exercises include:   Quad Sets -  Tighten up the muscle on the front of the thigh (Quad) and hold for 5-10 seconds.   Straight Leg Raises - With your knee straight (if you were given a brace, keep it on), lift the leg to 60 degrees, hold for 3 seconds, and slowly lower the leg.  Perform this exercise against resistance later as your leg gets stronger.  Leg Slides: Lying on your back, slowly slide your foot toward your buttocks, bending your knee up off the floor (only go as far as is comfortable). Then slowly slide your foot back down until your leg is flat on the floor again.  Angel Wings: Lying on your back spread your legs to the side as far apart as you can without causing discomfort.  Hamstring Strength:  Lying on your back, push your heel against the floor with your leg straight by tightening up the muscles of your buttocks.  Repeat, but this time bend your knee to a comfortable angle, and push your heel against the floor.  You may put a pillow under the heel to make it more comfortable if necessary.   A rehabilitation program following joint replacement surgery can speed recovery and prevent re-injury in the future due to weakened muscles. Contact your doctor or a physical therapist for more information on knee rehabilitation.    CONSTIPATION  Constipation is defined medically as fewer than three stools per week and severe constipation as less than one stool per week.  Even if you have a regular bowel pattern at home, your normal regimen is likely to be disrupted due to multiple reasons following surgery.  Combination of anesthesia, postoperative narcotics, change in appetite and fluid intake all can affect your bowels.   YOU MUST use at least one of the following options; they are listed in order of increasing strength to get the job done.  They are all available over the counter, and you may need to use some, POSSIBLY even all of these options:    Drink plenty of fluids (prune juice may be helpful) and high fiber  foods Colace 100 mg by mouth twice a day  Senokot for constipation as directed and as needed Dulcolax (bisacodyl), take with full glass of water  Miralax (polyethylene glycol) once or twice a day as needed.  If you have tried all these things and are unable to have a bowel movement in the first 3-4 days after surgery call either your surgeon or your primary doctor.    If you experience  loose stools or diarrhea, hold the medications until you stool forms back up.  If your symptoms do not get better within 1 week or if they get worse, check with your doctor.  If you experience "the worst abdominal pain ever" or develop nausea or vomiting, please contact the office immediately for further recommendations for treatment.   ITCHING:  If you experience itching with your medications, try taking only a single pain pill, or even half a pain pill at a time.  You can also use Benadryl over the counter for itching or also to help with sleep.   TED HOSE STOCKINGS:  Use stockings on both legs until for at least 2 weeks or as directed by physician office. They may be removed at night for sleeping.  MEDICATIONS:  See your medication summary on the "After Visit Summary" that nursing will review with you.  You may have some home medications which will be placed on hold until you complete the course of blood thinner medication.  It is important for you to complete the blood thinner medication as prescribed.  PRECAUTIONS:  If you experience chest pain or shortness of breath - call 911 immediately for transfer to the hospital emergency department.   If you develop a fever greater that 101 F, purulent drainage from wound, increased redness or drainage from wound, foul odor from the wound/dressing, or calf pain - CONTACT YOUR SURGEON.                                                   FOLLOW-UP APPOINTMENTS:  If you do not already have a post-op appointment, please call the office for an appointment to be seen by your  surgeon.  Guidelines for how soon to be seen are listed in your "After Visit Summary", but are typically between 1-4 weeks after surgery.  OTHER INSTRUCTIONS:   Knee Replacement:  Do not place pillow under knee, focus on keeping the knee straight while resting. CPM instructions: 0-90 degrees, 2 hours in the morning, 2 hours in the afternoon, and 2 hours in the evening. Place foam block, curve side up under heel at all times except when in CPM or when walking.  DO NOT modify, tear, cut, or change the foam block in any way.  POST-OPERATIVE OPIOID TAPER INSTRUCTIONS: It is important to wean off of your opioid medication as soon as possible. If you do not need pain medication after your surgery it is ok to stop day one. Opioids include: Codeine, Hydrocodone(Norco, Vicodin), Oxycodone(Percocet, oxycontin) and hydromorphone amongst others.  Long term and even short term use of opiods can cause: Increased pain response Dependence Constipation Depression Respiratory depression And more.  Withdrawal symptoms can include Flu like symptoms Nausea, vomiting And more Techniques to manage these symptoms Hydrate well Eat regular healthy meals Stay active Use relaxation techniques(deep breathing, meditating, yoga) Do Not substitute Alcohol to help with tapering If you have been on opioids for less than two weeks and do not have pain than it is ok to stop all together.  Plan to wean off of opioids This plan should start within one week post op of your joint replacement. Maintain the same interval or time between taking each dose and first decrease the dose.  Cut the total daily intake of opioids by one tablet each day Next start to increase the  time between doses. The last dose that should be eliminated is the evening dose.     MAKE SURE YOU:  Understand these instructions.  Get help right away if you are not doing well or get worse.    Thank you for letting us be a part of your medical care  team.  It is a privilege we respect greatly.  We hope these instructions will help you stay on track for a fast and full recovery!   Discharge instructions   Complete by: As directed    INSTRUCTIONS AFTER JOINT REPLACEMENT   Remove items at home which could result in a fall. This includes throw rugs or furniture in walking pathways ICE to the affected joint every three hours while awake for 30 minutes at a time, for at least the first 3-5 days, and then as needed for pain and swelling.  Continue to use ice for pain and swelling. You may notice swelling that will progress down to the foot and ankle.  This is normal after surgery.  Elevate your leg when you are not up walking on it.   Continue to use the breathing machine you got in the hospital (incentive spirometer) which will help keep your temperature down.  It is common for your temperature to cycle up and down following surgery, especially at night when you are not up moving around and exerting yourself.  The breathing machine keeps your lungs expanded and your temperature down.   DIET:  As you were doing prior to hospitalization, we recommend a well-balanced diet.  DRESSING / WOUND CARE / SHOWERING  You may shower 3 days after surgery, but keep the wounds dry during showering.  You may use an occlusive plastic wrap (Press'n Seal for example), NO SOAKING/SUBMERGING IN THE BATHTUB.  If the bandage gets wet, change with a clean dry gauze.  If the incision gets wet, pat the wound dry with a clean towel.  ACTIVITY  Increase activity slowly as tolerated, but follow the weight bearing instructions below.   No driving for 6 weeks or until further direction given by your physician.  You cannot drive while taking narcotics.  No lifting or carrying greater than 10 lbs. until further directed by your surgeon. Avoid periods of inactivity such as sitting longer than an hour when not asleep. This helps prevent blood clots.  You may return to work once  you are authorized by your doctor.     WEIGHT BEARING   Weight bearing as tolerated with assist device (walker, cane, etc) as directed, use it as long as suggested by your surgeon or therapist, typically at least 4-6 weeks.   EXERCISES  Results after joint replacement surgery are often greatly improved when you follow the exercise, range of motion and muscle strengthening exercises prescribed by your doctor. Safety measures are also important to protect the joint from further injury. Any time any of these exercises cause you to have increased pain or swelling, decrease what you are doing until you are comfortable again and then slowly increase them. If you have problems or questions, call your caregiver or physical therapist for advice.   Rehabilitation is important following a joint replacement. After just a few days of immobilization, the muscles of the leg can become weakened and shrink (atrophy).  These exercises are designed to build up the tone and strength of the thigh and leg muscles and to improve motion. Often times heat used for twenty to thirty minutes before working out will loosen  up your tissues and help with improving the range of motion but do not use heat for the first two weeks following surgery (sometimes heat can increase post-operative swelling).   These exercises can be done on a training (exercise) mat, on the floor, on a table or on a bed. Use whatever works the best and is most comfortable for you.    Use music or television while you are exercising so that the exercises are a pleasant break in your day. This will make your life better with the exercises acting as a break in your routine that you can look forward to.   Perform all exercises about fifteen times, three times per day or as directed.  You should exercise both the operative leg and the other leg as well.  Exercises include:   Quad Sets - Tighten up the muscle on the front of the thigh (Quad) and hold for 5-10  seconds.   Straight Leg Raises - With your knee straight (if you were given a brace, keep it on), lift the leg to 60 degrees, hold for 3 seconds, and slowly lower the leg.  Perform this exercise against resistance later as your leg gets stronger.  Leg Slides: Lying on your back, slowly slide your foot toward your buttocks, bending your knee up off the floor (only go as far as is comfortable). Then slowly slide your foot back down until your leg is flat on the floor again.  Angel Wings: Lying on your back spread your legs to the side as far apart as you can without causing discomfort.  Hamstring Strength:  Lying on your back, push your heel against the floor with your leg straight by tightening up the muscles of your buttocks.  Repeat, but this time bend your knee to a comfortable angle, and push your heel against the floor.  You may put a pillow under the heel to make it more comfortable if necessary.   A rehabilitation program following joint replacement surgery can speed recovery and prevent re-injury in the future due to weakened muscles. Contact your doctor or a physical therapist for more information on knee rehabilitation.    CONSTIPATION  Constipation is defined medically as fewer than three stools per week and severe constipation as less than one stool per week.  Even if you have a regular bowel pattern at home, your normal regimen is likely to be disrupted due to multiple reasons following surgery.  Combination of anesthesia, postoperative narcotics, change in appetite and fluid intake all can affect your bowels.   YOU MUST use at least one of the following options; they are listed in order of increasing strength to get the job done.  They are all available over the counter, and you may need to use some, POSSIBLY even all of these options:    Drink plenty of fluids (prune juice may be helpful) and high fiber foods Colace 100 mg by mouth twice a day  Senokot for constipation as directed and  as needed Dulcolax (bisacodyl), take with full glass of water  Miralax (polyethylene glycol) once or twice a day as needed.  If you have tried all these things and are unable to have a bowel movement in the first 3-4 days after surgery call either your surgeon or your primary doctor.    If you experience loose stools or diarrhea, hold the medications until you stool forms back up.  If your symptoms do not get better within 1 week or if they get  worse, check with your doctor.  If you experience "the worst abdominal pain ever" or develop nausea or vomiting, please contact the office immediately for further recommendations for treatment.   ITCHING:  If you experience itching with your medications, try taking only a single pain pill, or even half a pain pill at a time.  You can also use Benadryl over the counter for itching or also to help with sleep.   TED HOSE STOCKINGS:  Use stockings on both legs until for at least 2 weeks or as directed by physician office. They may be removed at night for sleeping.  MEDICATIONS:  See your medication summary on the "After Visit Summary" that nursing will review with you.  You may have some home medications which will be placed on hold until you complete the course of blood thinner medication.  It is important for you to complete the blood thinner medication as prescribed.  PRECAUTIONS:  If you experience chest pain or shortness of breath - call 911 immediately for transfer to the hospital emergency department.   If you develop a fever greater that 101 F, purulent drainage from wound, increased redness or drainage from wound, foul odor from the wound/dressing, or calf pain - CONTACT YOUR SURGEON.                                                   FOLLOW-UP APPOINTMENTS:  If you do not already have a post-op appointment, please call the office for an appointment to be seen by your surgeon.  Guidelines for how soon to be seen are listed in your "After Visit Summary",  but are typically between 1-4 weeks after surgery.  OTHER INSTRUCTIONS:   Knee Replacement:  Do not place pillow under knee, focus on keeping the knee straight while resting. CPM instructions: 0-90 degrees, 2 hours in the morning, 2 hours in the afternoon, and 2 hours in the evening. Place foam block, curve side up under heel at all times except when in CPM or when walking.  DO NOT modify, tear, cut, or change the foam block in any way.  POST-OPERATIVE OPIOID TAPER INSTRUCTIONS: It is important to wean off of your opioid medication as soon as possible. If you do not need pain medication after your surgery it is ok to stop day one. Opioids include: Codeine, Hydrocodone(Norco, Vicodin), Oxycodone(Percocet, oxycontin) and hydromorphone amongst others.  Long term and even short term use of opiods can cause: Increased pain response Dependence Constipation Depression Respiratory depression And more.  Withdrawal symptoms can include Flu like symptoms Nausea, vomiting And more Techniques to manage these symptoms Hydrate well Eat regular healthy meals Stay active Use relaxation techniques(deep breathing, meditating, yoga) Do Not substitute Alcohol to help with tapering If you have been on opioids for less than two weeks and do not have pain than it is ok to stop all together.  Plan to wean off of opioids This plan should start within one week post op of your joint replacement. Maintain the same interval or time between taking each dose and first decrease the dose.  Cut the total daily intake of opioids by one tablet each day Next start to increase the time between doses. The last dose that should be eliminated is the evening dose.     MAKE SURE YOU:  Understand these instructions.  Get help  right away if you are not doing well or get worse.    Thank you for letting us be a part of your medical care team.  It is a privilege we respect greatly.  We hope these instructions will help you  stay on track for a fast and full recovery!   Discharge instructions   Complete by: As directed    INSTRUCTIONS AFTER JOINT REPLACEMENT   Remove items at home which could result in a fall. This includes throw rugs or furniture in walking pathways ICE to the affected joint every three hours while awake for 30 minutes at a time, for at least the first 3-5 days, and then as needed for pain and swelling.  Continue to use ice for pain and swelling. You may notice swelling that will progress down to the foot and ankle.  This is normal after surgery.  Elevate your leg when you are not up walking on it.   Continue to use the breathing machine you got in the hospital (incentive spirometer) which will help keep your temperature down.  It is common for your temperature to cycle up and down following surgery, especially at night when you are not up moving around and exerting yourself.  The breathing machine keeps your lungs expanded and your temperature down.   DIET:  As you were doing prior to hospitalization, we recommend a well-balanced diet.  DRESSING / WOUND CARE / SHOWERING  You may shower 3 days after surgery, but keep the wounds dry during showering.  You may use an occlusive plastic wrap (Press'n Seal for example), NO SOAKING/SUBMERGING IN THE BATHTUB.  If the bandage gets wet, change with a clean dry gauze.  If the incision gets wet, pat the wound dry with a clean towel.  ACTIVITY  Increase activity slowly as tolerated, but follow the weight bearing instructions below.   No driving for 6 weeks or until further direction given by your physician.  You cannot drive while taking narcotics.  No lifting or carrying greater than 10 lbs. until further directed by your surgeon. Avoid periods of inactivity such as sitting longer than an hour when not asleep. This helps prevent blood clots.  You may return to work once you are authorized by your doctor.     WEIGHT BEARING   Weight bearing as tolerated  with assist device (walker, cane, etc) as directed, use it as long as suggested by your surgeon or therapist, typically at least 4-6 weeks.   EXERCISES  Results after joint replacement surgery are often greatly improved when you follow the exercise, range of motion and muscle strengthening exercises prescribed by your doctor. Safety measures are also important to protect the joint from further injury. Any time any of these exercises cause you to have increased pain or swelling, decrease what you are doing until you are comfortable again and then slowly increase them. If you have problems or questions, call your caregiver or physical therapist for advice.   Rehabilitation is important following a joint replacement. After just a few days of immobilization, the muscles of the leg can become weakened and shrink (atrophy).  These exercises are designed to build up the tone and strength of the thigh and leg muscles and to improve motion. Often times heat used for twenty to thirty minutes before working out will loosen up your tissues and help with improving the range of motion but do not use heat for the first two weeks following surgery (sometimes heat can increase post-operative  swelling).   These exercises can be done on a training (exercise) mat, on the floor, on a table or on a bed. Use whatever works the best and is most comfortable for you.    Use music or television while you are exercising so that the exercises are a pleasant break in your day. This will make your life better with the exercises acting as a break in your routine that you can look forward to.   Perform all exercises about fifteen times, three times per day or as directed.  You should exercise both the operative leg and the other leg as well.  Exercises include:   Quad Sets - Tighten up the muscle on the front of the thigh (Quad) and hold for 5-10 seconds.   Straight Leg Raises - With your knee straight (if you were given a brace, keep  it on), lift the leg to 60 degrees, hold for 3 seconds, and slowly lower the leg.  Perform this exercise against resistance later as your leg gets stronger.  Leg Slides: Lying on your back, slowly slide your foot toward your buttocks, bending your knee up off the floor (only go as far as is comfortable). Then slowly slide your foot back down until your leg is flat on the floor again.  Angel Wings: Lying on your back spread your legs to the side as far apart as you can without causing discomfort.  Hamstring Strength:  Lying on your back, push your heel against the floor with your leg straight by tightening up the muscles of your buttocks.  Repeat, but this time bend your knee to a comfortable angle, and push your heel against the floor.  You may put a pillow under the heel to make it more comfortable if necessary.   A rehabilitation program following joint replacement surgery can speed recovery and prevent re-injury in the future due to weakened muscles. Contact your doctor or a physical therapist for more information on knee rehabilitation.    CONSTIPATION  Constipation is defined medically as fewer than three stools per week and severe constipation as less than one stool per week.  Even if you have a regular bowel pattern at home, your normal regimen is likely to be disrupted due to multiple reasons following surgery.  Combination of anesthesia, postoperative narcotics, change in appetite and fluid intake all can affect your bowels.   YOU MUST use at least one of the following options; they are listed in order of increasing strength to get the job done.  They are all available over the counter, and you may need to use some, POSSIBLY even all of these options:    Drink plenty of fluids (prune juice may be helpful) and high fiber foods Colace 100 mg by mouth twice a day  Senokot for constipation as directed and as needed Dulcolax (bisacodyl), take with full glass of water  Miralax (polyethylene  glycol) once or twice a day as needed.  If you have tried all these things and are unable to have a bowel movement in the first 3-4 days after surgery call either your surgeon or your primary doctor.    If you experience loose stools or diarrhea, hold the medications until you stool forms back up.  If your symptoms do not get better within 1 week or if they get worse, check with your doctor.  If you experience "the worst abdominal pain ever" or develop nausea or vomiting, please contact the office immediately for further recommendations for  treatment.   ITCHING:  If you experience itching with your medications, try taking only a single pain pill, or even half a pain pill at a time.  You can also use Benadryl over the counter for itching or also to help with sleep.   TED HOSE STOCKINGS:  Use stockings on both legs until for at least 2 weeks or as directed by physician office. They may be removed at night for sleeping.  MEDICATIONS:  See your medication summary on the "After Visit Summary" that nursing will review with you.  You may have some home medications which will be placed on hold until you complete the course of blood thinner medication.  It is important for you to complete the blood thinner medication as prescribed.  PRECAUTIONS:  If you experience chest pain or shortness of breath - call 911 immediately for transfer to the hospital emergency department.   If you develop a fever greater that 101 F, purulent drainage from wound, increased redness or drainage from wound, foul odor from the wound/dressing, or calf pain - CONTACT YOUR SURGEON.                                                   FOLLOW-UP APPOINTMENTS:  If you do not already have a post-op appointment, please call the office for an appointment to be seen by your surgeon.  Guidelines for how soon to be seen are listed in your "After Visit Summary", but are typically between 1-4 weeks after surgery.  OTHER INSTRUCTIONS:   Knee  Replacement:  Do not place pillow under knee, focus on keeping the knee straight while resting. CPM instructions: 0-90 degrees, 2 hours in the morning, 2 hours in the afternoon, and 2 hours in the evening. Place foam block, curve side up under heel at all times except when in CPM or when walking.  DO NOT modify, tear, cut, or change the foam block in any way.  POST-OPERATIVE OPIOID TAPER INSTRUCTIONS: It is important to wean off of your opioid medication as soon as possible. If you do not need pain medication after your surgery it is ok to stop day one. Opioids include: Codeine, Hydrocodone(Norco, Vicodin), Oxycodone(Percocet, oxycontin) and hydromorphone amongst others.  Long term and even short term use of opiods can cause: Increased pain response Dependence Constipation Depression Respiratory depression And more.  Withdrawal symptoms can include Flu like symptoms Nausea, vomiting And more Techniques to manage these symptoms Hydrate well Eat regular healthy meals Stay active Use relaxation techniques(deep breathing, meditating, yoga) Do Not substitute Alcohol to help with tapering If you have been on opioids for less than two weeks and do not have pain than it is ok to stop all together.  Plan to wean off of opioids This plan should start within one week post op of your joint replacement. Maintain the same interval or time between taking each dose and first decrease the dose.  Cut the total daily intake of opioids by one tablet each day Next start to increase the time between doses. The last dose that should be eliminated is the evening dose.     MAKE SURE YOU:  Understand these instructions.  Get help right away if you are not doing well or get worse.    Thank you for letting us be a part of your medical care team.  It is a privilege we respect greatly.  We hope these instructions will help you stay on track for a fast and full recovery!   Increase activity slowly as  tolerated   Complete by: As directed    Increase activity slowly as tolerated   Complete by: As directed    Increase activity slowly as tolerated   Complete by: As directed    Increase activity slowly as tolerated   Complete by: As directed    Increase activity slowly as tolerated   Complete by: As directed    Post-operative opioid taper instructions:   Complete by: As directed    POST-OPERATIVE OPIOID TAPER INSTRUCTIONS: It is important to wean off of your opioid medication as soon as possible. If you do not need pain medication after your surgery it is ok to stop day one. Opioids include: Codeine, Hydrocodone(Norco, Vicodin), Oxycodone(Percocet, oxycontin) and hydromorphone amongst others.  Long term and even short term use of opiods can cause: Increased pain response Dependence Constipation Depression Respiratory depression And more.  Withdrawal symptoms can include Flu like symptoms Nausea, vomiting And more Techniques to manage these symptoms Hydrate well Eat regular healthy meals Stay active Use relaxation techniques(deep breathing, meditating, yoga) Do Not substitute Alcohol to help with tapering If you have been on opioids for less than two weeks and do not have pain than it is ok to stop all together.  Plan to wean off of opioids This plan should start within one week post op of your joint replacement. Maintain the same interval or time between taking each dose and first decrease the dose.  Cut the total daily intake of opioids by one tablet each day Next start to increase the time between doses. The last dose that should be eliminated is the evening dose.      Post-operative opioid taper instructions:   Complete by: As directed    POST-OPERATIVE OPIOID TAPER INSTRUCTIONS: It is important to wean off of your opioid medication as soon as possible. If you do not need pain medication after your surgery it is ok to stop day one. Opioids include: Codeine,  Hydrocodone(Norco, Vicodin), Oxycodone(Percocet, oxycontin) and hydromorphone amongst others.  Long term and even short term use of opiods can cause: Increased pain response Dependence Constipation Depression Respiratory depression And more.  Withdrawal symptoms can include Flu like symptoms Nausea, vomiting And more Techniques to manage these symptoms Hydrate well Eat regular healthy meals Stay active Use relaxation techniques(deep breathing, meditating, yoga) Do Not substitute Alcohol to help with tapering If you have been on opioids for less than two weeks and do not have pain than it is ok to stop all together.  Plan to wean off of opioids This plan should start within one week post op of your joint replacement. Maintain the same interval or time between taking each dose and first decrease the dose.  Cut the total daily intake of opioids by one tablet each day Next start to increase the time between doses. The last dose that should be eliminated is the evening dose.      Post-operative opioid taper instructions:   Complete by: As directed    POST-OPERATIVE OPIOID TAPER INSTRUCTIONS: It is important to wean off of your opioid medication as soon as possible. If you do not need pain medication after your surgery it is ok to stop day one. Opioids include: Codeine, Hydrocodone(Norco, Vicodin), Oxycodone(Percocet, oxycontin) and hydromorphone amongst others.  Long term and even short term use of opiods can  cause: Increased pain response Dependence Constipation Depression Respiratory depression And more.  Withdrawal symptoms can include Flu like symptoms Nausea, vomiting And more Techniques to manage these symptoms Hydrate well Eat regular healthy meals Stay active Use relaxation techniques(deep breathing, meditating, yoga) Do Not substitute Alcohol to help with tapering If you have been on opioids for less than two weeks and do not have pain than it is ok to stop all  together.  Plan to wean off of opioids This plan should start within one week post op of your joint replacement. Maintain the same interval or time between taking each dose and first decrease the dose.  Cut the total daily intake of opioids by one tablet each day Next start to increase the time between doses. The last dose that should be eliminated is the evening dose.      Post-operative opioid taper instructions:   Complete by: As directed    POST-OPERATIVE OPIOID TAPER INSTRUCTIONS: It is important to wean off of your opioid medication as soon as possible. If you do not need pain medication after your surgery it is ok to stop day one. Opioids include: Codeine, Hydrocodone(Norco, Vicodin), Oxycodone(Percocet, oxycontin) and hydromorphone amongst others.  Long term and even short term use of opiods can cause: Increased pain response Dependence Constipation Depression Respiratory depression And more.  Withdrawal symptoms can include Flu like symptoms Nausea, vomiting And more Techniques to manage these symptoms Hydrate well Eat regular healthy meals Stay active Use relaxation techniques(deep breathing, meditating, yoga) Do Not substitute Alcohol to help with tapering If you have been on opioids for less than two weeks and do not have pain than it is ok to stop all together.  Plan to wean off of opioids This plan should start within one week post op of your joint replacement. Maintain the same interval or time between taking each dose and first decrease the dose.  Cut the total daily intake of opioids by one tablet each day Next start to increase the time between doses. The last dose that should be eliminated is the evening dose.      Post-operative opioid taper instructions:   Complete by: As directed    POST-OPERATIVE OPIOID TAPER INSTRUCTIONS: It is important to wean off of your opioid medication as soon as possible. If you do not need pain medication after your surgery  it is ok to stop day one. Opioids include: Codeine, Hydrocodone(Norco, Vicodin), Oxycodone(Percocet, oxycontin) and hydromorphone amongst others.  Long term and even short term use of opiods can cause: Increased pain response Dependence Constipation Depression Respiratory depression And more.  Withdrawal symptoms can include Flu like symptoms Nausea, vomiting And more Techniques to manage these symptoms Hydrate well Eat regular healthy meals Stay active Use relaxation techniques(deep breathing, meditating, yoga) Do Not substitute Alcohol to help with tapering If you have been on opioids for less than two weeks and do not have pain than it is ok to stop all together.  Plan to wean off of opioids This plan should start within one week post op of your joint replacement. Maintain the same interval or time between taking each dose and first decrease the dose.  Cut the total daily intake of opioids by one tablet each day Next start to increase the time between doses. The last dose that should be eliminated is the evening dose.          Contact information for follow-up providers    Marcene Corning, MD. Schedule an appointment as soon  as possible for a visit in 2 weeks.   Specialty: Orthopedic Surgery Contact information: [address] [place] [address] [phone]        Care, [facility] Follow up.   Specialty: Home Health Services Why: PT Contact information: [address] [place] [address] [phone]            Contact information for after-discharge care    Destination    [facility] .   Service: Skilled Nursing Contact information: [address] [place] [address] [phone]                   Signed: Ginger Organ Dalvin Clipper 11/19/2020, 10:05 AM

## 2020-11-19 NOTE — Progress Notes (Signed)
Subjective: 7 Days Post-Op Procedure(s) (LRB): LEFT TOTAL HIP ARTHROPLASTY ANTERIOR APPROACH (Left)   [name] is feeling better today. Her lab work and urinalysis came back clean today. She has gotten up 2 x to the restroom on her own with out assistance. She is hoping to go home today. Family is here today at bedside.  Activity level:  wbat Diet tolerance:  ok Voiding:  ok Patient reports pain as mild.    Objective: Vital signs in last 24 hours: Temp:  [97.6 F (36.4 C)-97.9 F (36.6 C)] 97.9 F (36.6 C) (05/10 0534) Pulse Rate:  [80-84] 80 (05/10 0534) Resp:  [15-16] 15 (05/10 0534) BP: (142-152)/(68-81) 149/68 (05/10 0534) SpO2:  [100 %] 100 % (05/10 0534)  Labs: Recent Labs    11/19/20 0345  HGB 9.2*   Recent Labs    11/19/20 0345  WBC 5.0  RBC 3.03*  HCT 28.4*  PLT 239   Recent Labs    11/19/20 0345  NA 137  K 3.6  CL 105  CO2 23  BUN 12  CREATININE 0.93  GLUCOSE 185*  CALCIUM 9.0   No results for input(s): LABPT, INR in the last 72 hours.  Physical Exam:  Neurologically intact ABD soft Neurovascular intact Sensation intact distally Intact pulses distally Dorsiflexion/Plantar flexion intact Incision: dressing C/D/I and no drainage No cellulitis present Compartment soft  Assessment/Plan:  7 Days Post-Op Procedure(s) (LRB): LEFT TOTAL HIP ARTHROPLASTY ANTERIOR APPROACH (Left) Advance diet Up with therapy Discharge home with home health today after PT. Continue on 81mg  asa BID for dvt prevention. Follow up in office 2 weeks post op. We will get home healt PT set up for patient.  We will continue with tylenol for post op pain control   [name] 11/19/2020, 10:01 AM

## 2020-11-19 NOTE — TOC Transition Note (Addendum)
Transition of Care [facility]) - CM/SW Discharge Note  Patient Details  Name: [name] MRN: [id number] Date of Birth: [birth year]  Transition [facility] CM/SW Contact:  Sherie Don, LCSW Phone Number: 11/19/2020, 12:23 PM  Clinical Narrative: [facility] has received insurance authorization for SNF, however, PT and orthro are now recommending HHPT as patient has improved. Janie with [facility] updated.  CSW met with patient and her son to discuss the change in recommendations. Per patient, one of her son's will be with her at home providing care/supervision for a few days. After that, several other family members and friends will check on patient on a regular basis throughout the day. CSW reviewed DME needs with patient and patient confirmed she will need a 3N1. Patient is also requesting a [place] aide if available through [facility].  CSW made 3N1 referral to [facility] with [employer]. [employer] to deliver DME to patient's room. CSW spoke with Danae Chen at [facility] and an aide will be available. New HH orders have been placed. CSW updated RN. TOC signing off.  Final next level of care: [place] Barriers to Discharge: Barriers Resolved  Patient Goals and CMS Choice Patient states their goals for this hospitalization and ongoing recovery are:: Discharge home with [facility] CMS Medicare.gov Compare Post Acute Care list provided to:: Patient Choice offered to / list presented to : Patient  Discharge Plan and Services Post Acute Care Choice: [place]          DME Arranged: 3-N-1 DME Agency: [employer] Date DME Agency Contacted: 11/19/20 Time DME Agency Contacted: [id number] Representative spoke with at DME Agency: [place] Arranged: [place]: [facility] Date [place]: 11/19/20 Time [place]: 1105 Representative spoke with at [place]: Danae Chen  Readmission Risk Interventions No flowsheet data found.

## 2020-11-19 NOTE — Plan of Care (Signed)
  Problem: Elimination: Goal: Will not experience complications related to bowel motility Outcome: Progressing   Problem: Activity: Goal: Ability to tolerate increased activity will improve Outcome: Progressing   Problem: Pain Management: Goal: Pain level will decrease with appropriate interventions Outcome: Progressing

## 2020-11-20 LAB — URINE CULTURE: Culture: <10000 — AB

## 2021-05-08 IMAGING — DX DG HIP (WITH OR WITHOUT PELVIS) 2-3V*L*
3 series · 3 of 3 positions shown · non-contrast
Comparison: November 12, 2020.

CLINICAL DATA: Post LEFT hip arthroplasty, difficulty walking.
Painful to touch.

EXAM:
DG HIP (WITH OR WITHOUT PELVIS) 2-3V LEFT

[pelvis ap]
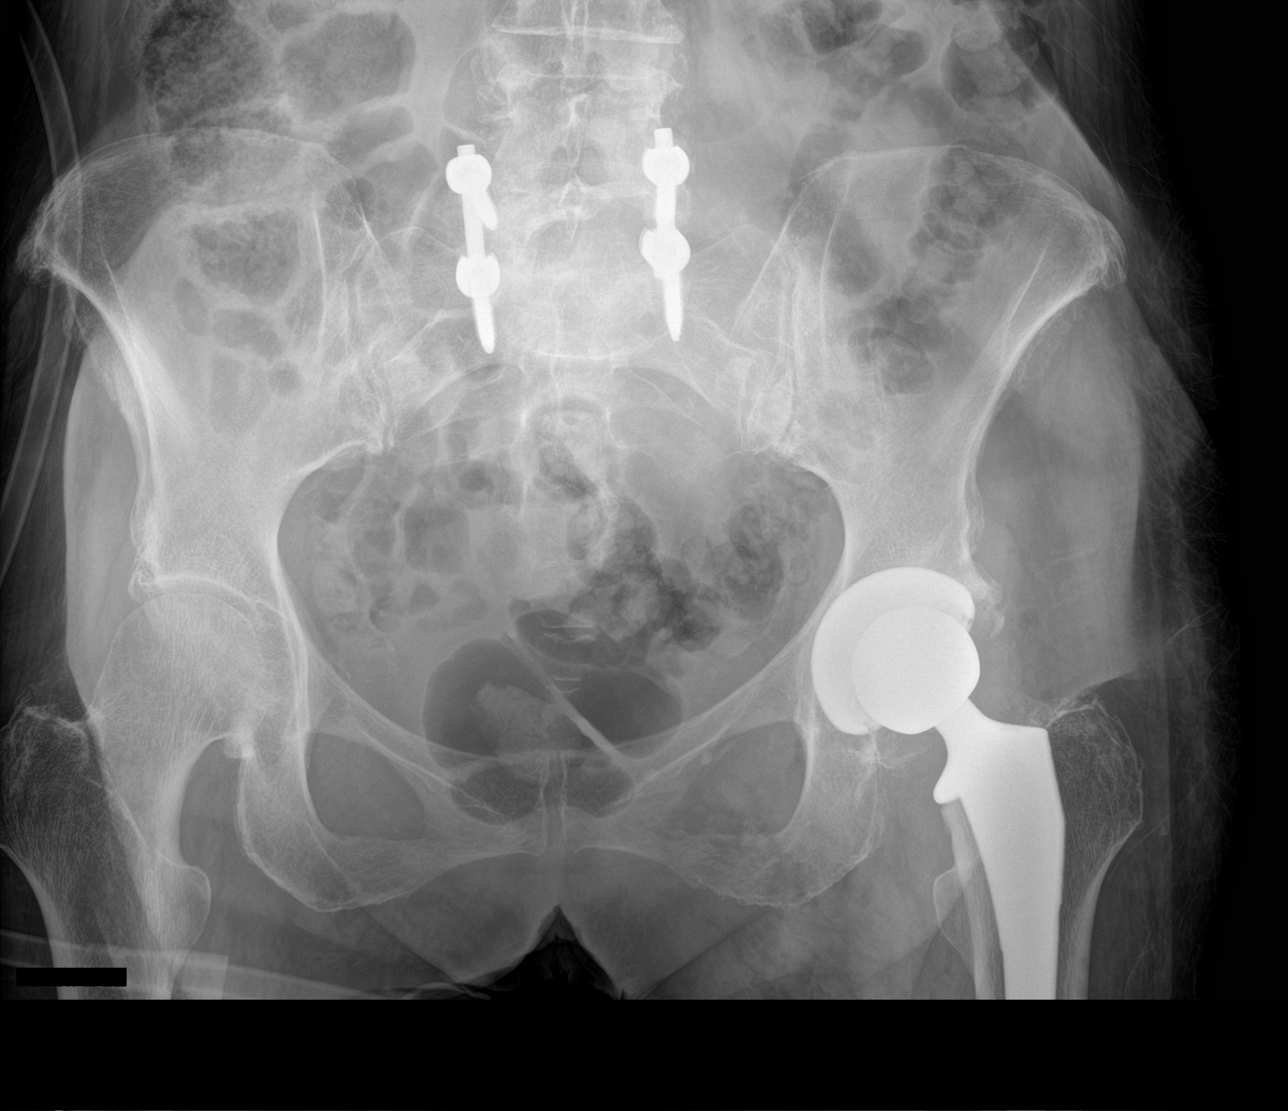

[hip ap]
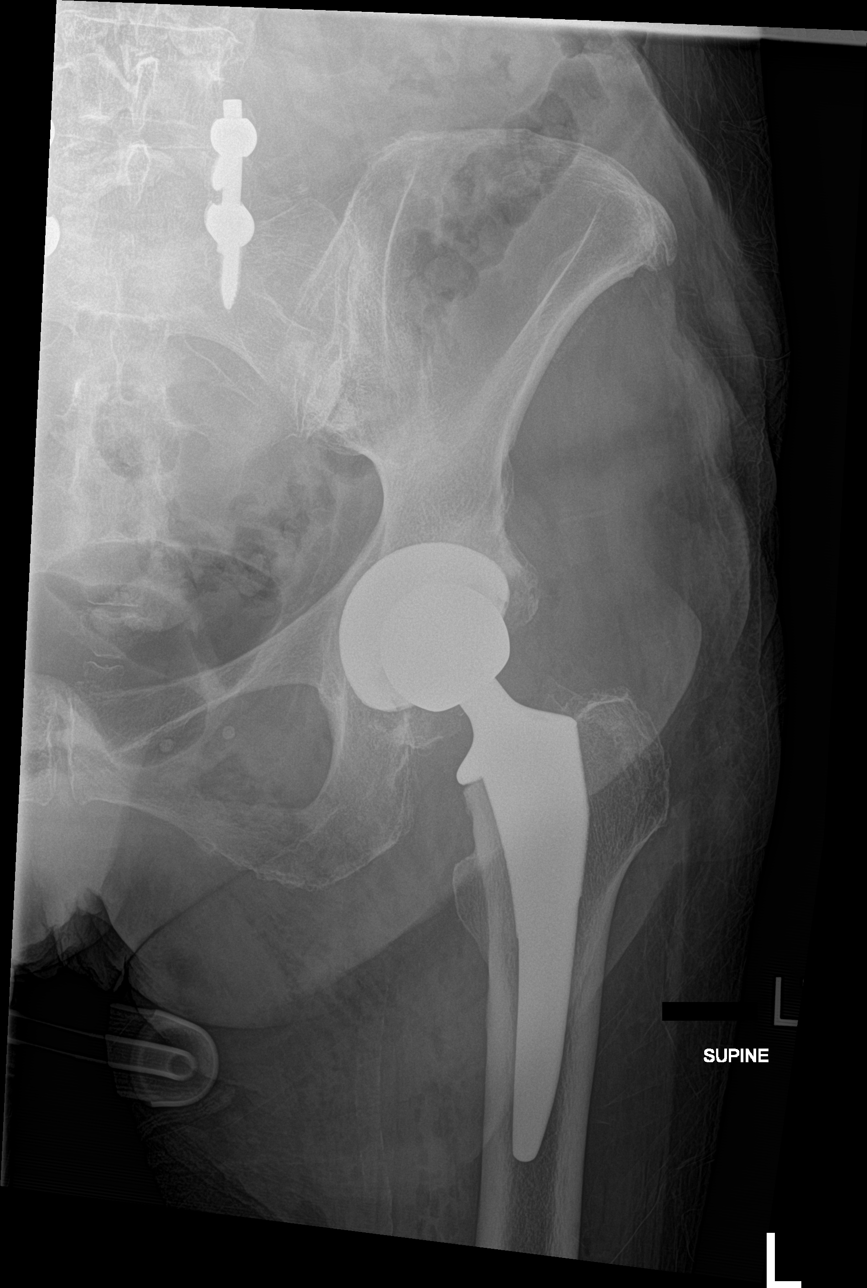

[hip lat]
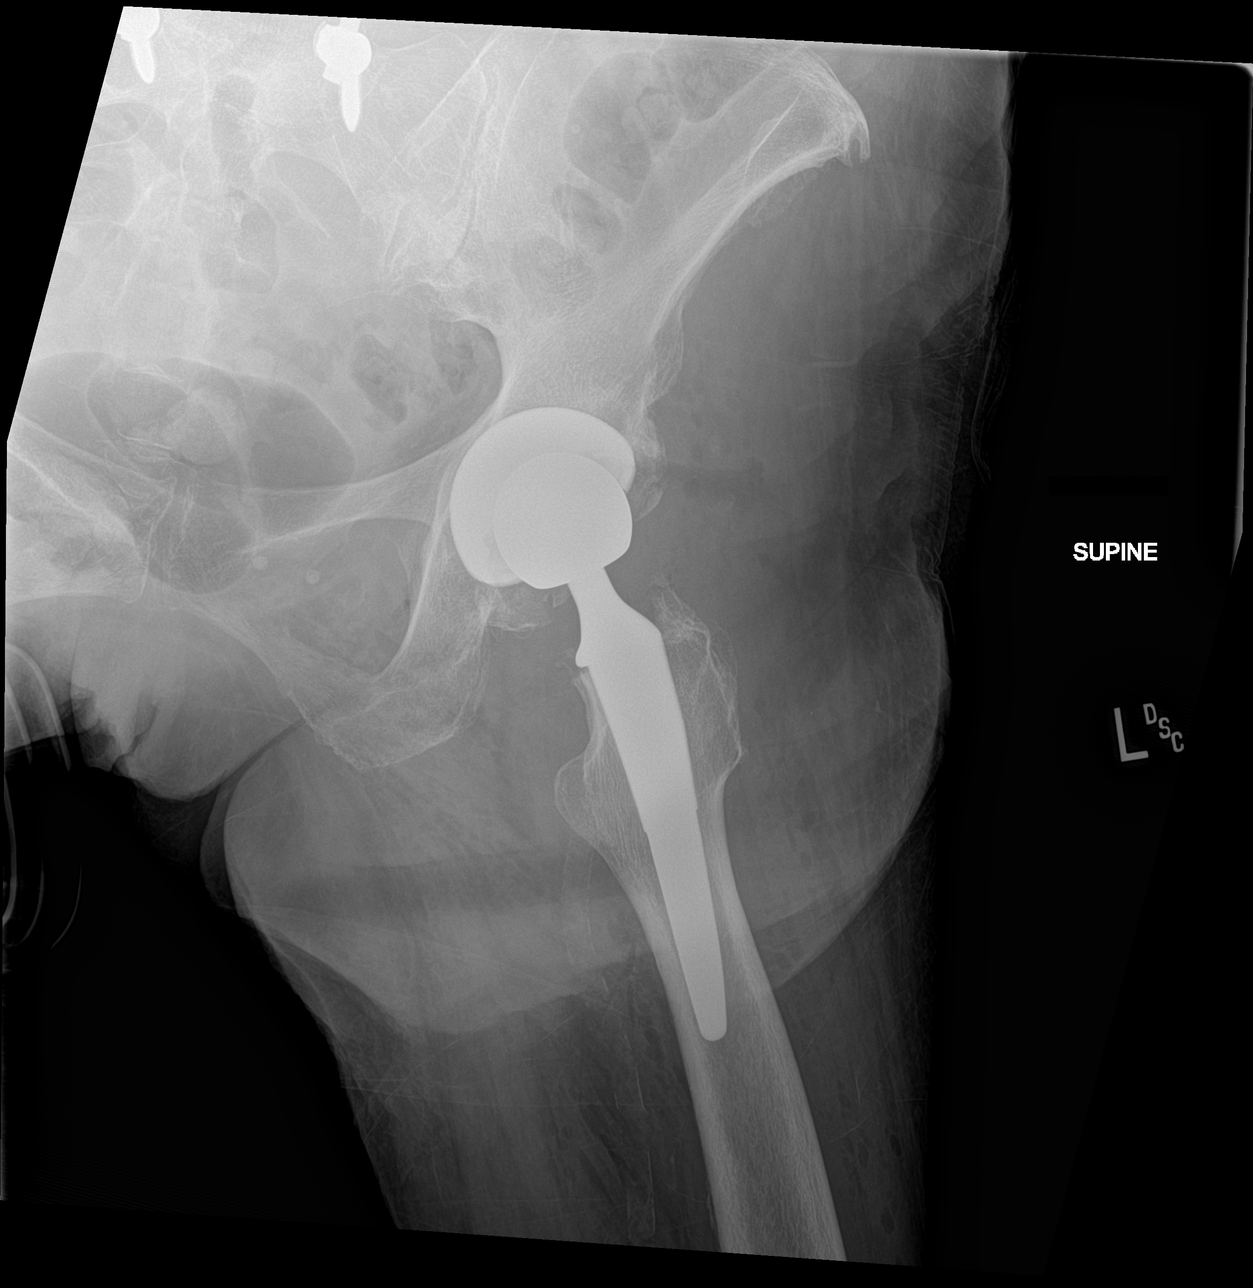

[3 of 3 positions shown; findings below may reference images not displayed]

FINDINGS: Post LEFT hip arthroplasty. Small amount of gas in the soft tissues
of the LEFT upper thigh and about the LEFT hip in this recent
postoperative patient.

Some gas present in soft tissues adjacent to the surgical site on
previous imaging.

No signs of acute fracture. Unchanged appearance of femoral and
acetabular components compared to previous imaging. No sign of
dislocation.
IMPRESSION: Post LEFT hip arthroplasty without signs of hardware complication or
acute fracture.

Small amounts of gas in the soft tissues around the joint in this
patient following recent surgery.

Incidental note made of spinal fusion in the lower lumbar spine.

## 2021-09-29 ENCOUNTER — Other Ambulatory Visit: Payer: Self-pay

## 2021-09-29 ENCOUNTER — Emergency Department (HOSPITAL_BASED_OUTPATIENT_CLINIC_OR_DEPARTMENT_OTHER)
Admission: EM | Admit: 2021-09-29 | Discharge: 2021-09-29 | Disposition: A | Discharge status: Home / Self Care | Payer: Medicare Other | Attending: Emergency Medicine | Admitting: Emergency Medicine

## 2021-09-29 ENCOUNTER — Encounter (HOSPITAL_BASED_OUTPATIENT_CLINIC_OR_DEPARTMENT_OTHER): Payer: Self-pay | Admitting: *Deleted

## 2021-09-29 DIAGNOSIS — E1165 Type 2 diabetes mellitus with hyperglycemia: Secondary | ICD-10-CM | POA: Diagnosis not present

## 2021-09-29 DIAGNOSIS — Z7984 Long term (current) use of oral hypoglycemic drugs: Secondary | ICD-10-CM | POA: Insufficient documentation

## 2021-09-29 DIAGNOSIS — R739 Hyperglycemia, unspecified: Secondary | ICD-10-CM

## 2021-09-29 DIAGNOSIS — Z7982 Long term (current) use of aspirin: Secondary | ICD-10-CM | POA: Diagnosis not present

## 2021-09-29 LAB — URINALYSIS, ROUTINE W REFLEX MICROSCOPIC
Bilirubin Urine: NEGATIVE
Glucose, UA: 250 mg/dL — AB
Hgb urine dipstick: NEGATIVE
Ketones, ur: NEGATIVE mg/dL
Leukocytes,Ua: NEGATIVE
Nitrite: NEGATIVE
Protein, ur: NEGATIVE mg/dL
Specific Gravity, Urine: 1.01 (ref 1.005–1.030)
pH: 5.5 (ref 5.0–8.0)

## 2021-09-29 LAB — I-STAT VENOUS BLOOD GAS, ED
Acid-base deficit: 9 mmol/L — ABNORMAL HIGH (ref 0.0–2.0)
Bicarbonate: 17.6 mmol/L — ABNORMAL LOW (ref 20.0–28.0)
Calcium, Ion: 1.26 mmol/L (ref 1.15–1.40)
HCT: 36 % (ref 36.0–46.0)
Hemoglobin: 12.2 g/dL (ref 12.0–15.0)
O2 Saturation: 61 %
Patient temperature: 98.6
Potassium: 5 mmol/L (ref 3.5–5.1)
Sodium: 137 mmol/L (ref 135–145)
TCO2: 19 mmol/L — ABNORMAL LOW (ref 22–32)
pCO2, Ven: 38.6 mmHg — ABNORMAL LOW (ref 44–60)
pH, Ven: 7.266 (ref 7.25–7.43)
pO2, Ven: 36 mmHg (ref 32–45)

## 2021-09-29 LAB — BASIC METABOLIC PANEL
Anion gap: 12 (ref 5–15)
Anion gap: 10 (ref 5–15)
BUN: 42 mg/dL — ABNORMAL HIGH (ref 8–23)
BUN: 36 mg/dL — ABNORMAL HIGH (ref 8–23)
CO2: 16 mmol/L — ABNORMAL LOW (ref 22–32)
CO2: 16 mmol/L — ABNORMAL LOW (ref 22–32)
Calcium: 9.6 mg/dL (ref 8.9–10.3)
Calcium: 8.4 mg/dL — ABNORMAL LOW (ref 8.9–10.3)
Chloride: 105 mmol/L (ref 98–111)
Chloride: 111 mmol/L (ref 98–111)
Creatinine, Ser: 1.16 mg/dL — ABNORMAL HIGH (ref 0.44–1.00)
Creatinine, Ser: 0.97 mg/dL (ref 0.44–1.00)
GFR, Estimated: 48 mL/min — ABNORMAL LOW (ref >60)
GFR, Estimated: 59 mL/min — ABNORMAL LOW (ref >60)
Glucose, Bld: 272 mg/dL — ABNORMAL HIGH (ref 70–99)
Glucose, Bld: 197 mg/dL — ABNORMAL HIGH (ref 70–99)
Potassium: 5.4 mmol/L — ABNORMAL HIGH (ref 3.5–5.1)
Potassium: 4.8 mmol/L (ref 3.5–5.1)
Sodium: 133 mmol/L — ABNORMAL LOW (ref 135–145)
Sodium: 137 mmol/L (ref 135–145)

## 2021-09-29 LAB — CBC WITH DIFFERENTIAL/PLATELET
Abs Immature Granulocytes: 0.03 10*3/uL (ref 0.00–0.07)
Basophils Absolute: 0 10*3/uL (ref 0.0–0.1)
Basophils Relative: 0 %
Eosinophils Absolute: 0 10*3/uL (ref 0.0–0.5)
Eosinophils Relative: 0 %
HCT: 35.3 % — ABNORMAL LOW (ref 36.0–46.0)
Hemoglobin: 11.4 g/dL — ABNORMAL LOW (ref 12.0–15.0)
Immature Granulocytes: 1 %
Lymphocytes Relative: 22 %
Lymphs Abs: 1.1 10*3/uL (ref 0.7–4.0)
MCH: 28.4 pg (ref 26.0–34.0)
MCHC: 32.3 g/dL (ref 30.0–36.0)
MCV: 88 fL (ref 80.0–100.0)
Monocytes Absolute: 0.4 10*3/uL (ref 0.1–1.0)
Monocytes Relative: 7 %
Neutro Abs: 3.6 10*3/uL (ref 1.7–7.7)
Neutrophils Relative %: 70 %
Platelets: 246 10*3/uL (ref 150–400)
RBC: 4.01 MIL/uL (ref 3.87–5.11)
RDW: 14.3 % (ref 11.5–15.5)
WBC: 5.1 10*3/uL (ref 4.0–10.5)
nRBC: 0 % (ref 0.0–0.2)

## 2021-09-29 LAB — CBG MONITORING, ED: Glucose-Capillary: 278 mg/dL — ABNORMAL HIGH (ref 70–99)

## 2021-09-29 MED ORDER — CARVEDILOL 12.5 MG PO TABS
25.0000 mg | ORAL_TABLET | Freq: Two times a day (BID) | ORAL | Status: DC
  Administered 2021-09-29: 25 mg via ORAL
  Filled 2021-09-29: qty 2

## 2021-09-29 MED ORDER — SODIUM ZIRCONIUM CYCLOSILICATE 10 G PO PACK
10.0000 g | PACK | Freq: Once | ORAL | Status: AC
  Administered 2021-09-29: 10 g via ORAL
  Filled 2021-09-29: qty 1

## 2021-09-29 MED ORDER — SODIUM CHLORIDE 0.9 % IV BOLUS
1000.0000 mL | Freq: Once | INTRAVENOUS | Status: AC
  Administered 2021-09-29: 1000 mL via INTRAVENOUS

## 2021-09-29 MED ORDER — SODIUM CHLORIDE 0.9 % IV BOLUS
500.0000 mL | Freq: Once | INTRAVENOUS | Status: AC
  Administered 2021-09-29: 500 mL via INTRAVENOUS

## 2021-09-29 NOTE — ED Notes (Signed)
ED Provider at bedside. 

## 2021-09-29 NOTE — ED Provider Notes (Signed)
[facility] EMERGENCY DEPARTMENT Provider Note   CSN: [id number] Arrival date & time: 09/29/21  1717     History  Chief Complaint  Patient presents with   Hyperglycemia    [name] is a 81 y.o. female.  Patient states steroid injection for chronic back pain last week.  Blood sugars have been elevated since.  The history is provided by the patient.  Hyperglycemia Blood sugar level PTA:  >200 Severity:  Mild Onset quality:  Gradual Duration:  3 days Timing:  Constant Progression:  Unchanged Chronicity:  New Diabetes status:  Controlled with oral medications Context comment:  Got steroid shot for surgery several days ago and sugar elevated sicne Relieved by:  Nothing Associated symptoms: no abdominal pain, no altered mental status, no blurred vision, no chest pain, no confusion, no dehydration, no diaphoresis, no dizziness, no dysuria, no fatigue, no fever, no increased appetite, no increased thirst, no malaise, no nausea, no polyuria, no shortness of breath, no syncope, no vomiting, no weakness and no weight change   Risk factors: recent steroid use       Home Medications Prior to Admission medications   Medication Sig Start Date End Date Taking? Authorizing Provider  aspirin EC 81 MG tablet Take 1 tablet (81 mg total) by mouth 2 (two) times daily after a meal. 11/15/20   Elodia Florence, PA-C  carvedilol (COREG) 25 MG tablet Take 25 mg by mouth 2 (two) times daily with a meal.    [provider]  glimepiride (AMARYL) 4 MG tablet Take 4 mg 2 (two) times daily by mouth.    [provider]  HYDROcodone-acetaminophen (NORCO/VICODIN) 5-325 MG tablet Take 1-2 tablets by mouth every 6 (six) hours as needed for moderate pain or severe pain (post op pain). 11/15/20   Elodia Florence, PA-C  levETIRAcetam (KEPPRA) 500 MG tablet Take 500 mg by mouth in the morning and at bedtime. 10/24/20   [provider]  metFORMIN (GLUCOPHAGE) 500 MG tablet Take 500  mg by mouth in the morning, at noon, and at bedtime.    [provider]  pantoprazole (PROTONIX) 40 MG tablet Take 1 tablet (40 mg total) by mouth daily. Take 1 tab twice a day. Patient taking differently: Take 40 mg by mouth 2 (two) times daily. 06/02/17   Beryl Meager, NP  rosuvastatin (CRESTOR) 5 MG tablet Take 5 mg by mouth in the morning and at bedtime. 10/24/20   [provider]  tiZANidine (ZANAFLEX) 4 MG tablet Take 1 tablet (4 mg total) by mouth every 6 (six) hours as needed for muscle spasms. 11/15/20 11/15/21  Elodia Florence, PA-C      Allergies    Escitalopram oxalate, Byetta 5 mcg pen [exenatide], Codeine sulfate, Metoprolol tartrate, and Lipitor [atorvastatin]    Review of Systems   Review of Systems  Constitutional:  Negative for diaphoresis, fatigue and fever.  Eyes:  Negative for blurred vision.  Respiratory:  Negative for shortness of breath.   Cardiovascular:  Negative for chest pain and syncope.  Gastrointestinal:  Negative for abdominal pain, nausea and vomiting.  Endocrine: Negative for polydipsia and polyuria.  Genitourinary:  Negative for dysuria.  Neurological:  Negative for dizziness and weakness.  Psychiatric/Behavioral:  Negative for confusion.    Physical Exam Updated Vital Signs  ED Triage Vitals  Enc Vitals Group     BP 09/29/21 1727 (!) 187/96     Pulse Rate 09/29/21 1727 67     Resp 09/29/21 1727  18     Temp 09/29/21 1727 97.8 F (36.6 C)     Temp src --      SpO2 09/29/21 1727 100 %     Weight 09/29/21 1724 132 lb 0.9 oz (59.9 kg)     Height 09/29/21 1724 5\' 9"  (1.753 m)     Head Circumference --      Peak Flow --      Pain Score 09/29/21 1724 0     Pain Loc --      Pain Edu? --      Excl. in GC? --     Physical Exam Vitals and nursing note reviewed.  Constitutional:      General: She is not in acute distress.    Appearance: She is well-developed. She is not ill-appearing.  HENT:     Head: Normocephalic and atraumatic.      Nose: Nose normal.     Mouth/Throat:     Mouth: Mucous membranes are moist.  Eyes:     Extraocular Movements: Extraocular movements intact.     Conjunctiva/sclera: Conjunctivae normal.     Pupils: Pupils are equal, round, and reactive to light.  Cardiovascular:     Rate and Rhythm: Normal rate and regular rhythm.     Pulses: Normal pulses.     Heart sounds: Normal heart sounds. No murmur heard. Pulmonary:     Effort: Pulmonary effort is normal. No respiratory distress.     Breath sounds: Normal breath sounds.  Abdominal:     Palpations: Abdomen is soft.     Tenderness: There is no abdominal tenderness.  Musculoskeletal:        General: No swelling.     Cervical back: Normal range of motion and neck supple.  Skin:    General: Skin is warm and dry.     Capillary Refill: Capillary refill takes less than 2 seconds.  Neurological:     General: No focal deficit present.     Mental Status: She is alert.  Psychiatric:        Mood and Affect: Mood normal.    ED Results / Procedures / Treatments   Labs (all labs ordered are listed, but only abnormal results are displayed) Labs Reviewed  CBC WITH DIFFERENTIAL/PLATELET - Abnormal; Notable for the following components:      Result Value   Hemoglobin 11.4 (*)    HCT 35.3 (*)    All other components within normal limits  BASIC METABOLIC PANEL - Abnormal; Notable for the following components:   Sodium 133 (*)    Potassium 5.4 (*)    CO2 16 (*)    Glucose, Bld 272 (*)    BUN 42 (*)    Creatinine, Ser 1.16 (*)    GFR, Estimated 48 (*)    All other components within normal limits  URINALYSIS, ROUTINE W REFLEX MICROSCOPIC - Abnormal; Notable for the following components:   Glucose, UA 250 (*)    All other components within normal limits  BASIC METABOLIC PANEL - Abnormal; Notable for the following components:   CO2 16 (*)    Glucose, Bld 197 (*)    BUN 36 (*)    Calcium 8.4 (*)    GFR, Estimated 59 (*)    All other components  within normal limits  CBG MONITORING, ED - Abnormal; Notable for the following components:   Glucose-Capillary 278 (*)    All other components within normal limits  I-STAT VENOUS BLOOD GAS, ED - Abnormal; Notable  for the following components:   pCO2, Ven 38.6 (*)    Bicarbonate 17.6 (*)    TCO2 19 (*)    Acid-base deficit 9.0 (*)    All other components within normal limits  BLOOD GAS, VENOUS  BETA-HYDROXYBUTYRIC ACID    EKG None  Radiology No results found.  Procedures Procedures    Medications Ordered in ED Medications  carvedilol (COREG) tablet 25 mg (25 mg Oral Given 09/29/21 2028)  sodium chloride 0.9 % bolus 1,000 mL (0 mLs Intravenous Stopped 09/29/21 1935)  sodium zirconium cyclosilicate (LOKELMA) packet 10 g (10 g Oral Given 09/29/21 1949)  sodium chloride 0.9 % bolus 500 mL (0 mLs Intravenous Stopped 09/29/21 2023)    ED Course/ Medical Decision Making/ A&P                           Medical Decision Making Amount and/or Complexity of Data Reviewed Labs: ordered.  Risk Prescription drug management.   [name] is here with hyperglycemia.  Elevated blood pressure but otherwise normal vitals.  Had a steroid injection several days ago for back pain and has had elevated blood sugar greater than 200 for the last several days.  Sent here by primary care doctor for IV fluids and evaluation.  She has a history of diabetes but controlled on oral medications.  She states that her blood sugars are normally in the 90s.  Blood sugar upon arrival here is 278.  Differential diagnosis is hyperglycemia versus DKA.  Overall she has no symptoms.  No infectious symptoms.  No chest pain or shortness of breath or nausea or vomiting or abdominal pain.  Suspect elevated blood sugar in the setting of recent steroid use.  Will check CBC, BMP give IV fluids and reevaluate.  Upon review and interpretation of her labs she did have potassium of 5.4, bicarb is 16 but normal anion gap.   Creatinine mildly elevated at 1.1.  Blood sugar 270.  She was given additional 500 cc of fluid as well as Lokelma.  At that time I obtained a blood gas that showed a pH is 7.266.  She had no ketones in her urine.  Does not appear to be consistent with DKA.  Not sure why bicarb is 16.  Talked with medicine on the phone about this and they recommended rechecking BMP after additional fluids and will admit for further hydration if continues to be low.  On recheck potassium is 4.8.  Bicarb still 16, creatinine has improved.  Overall cannot really explain why bicarb is 16.  She is not febrile.  She does not have any renal injury.  Medicine accepted patient for admission but patient did not want to stay for further care.  Patient left AMA she does state that she has follow-up with her primary care doctor tomorrow morning and recommend that she have her blood test redrawn at that time.  Discharged against medicial advice.  This chart was dictated using voice recognition software.  Despite best efforts to proofread,  errors can occur which can change the documentation meaning.  This chart was dictated using voice recognition software.  Despite best efforts to proofread,  errors can occur which can change the documentation meaning.         Final Clinical Impression(s) / ED Diagnoses Final diagnoses:  Hyperglycemia    Rx / DC Orders ED Discharge Orders     None  Virgina Norfolk, DO 09/29/21 2302

## 2021-09-29 NOTE — Discharge Instructions (Signed)
Follow-up with your primary care doctor for repeat labs tomorrow as discussed.  Please return if you change your mind about being admitted.  You had low bicarb today is 16.  But she did not appear to be in a diabetic emergency.  I believe this is secondary to dehydration but recommended that he be admitted so that we can continue to hydrate you. ?

## 2021-09-29 NOTE — ED Triage Notes (Signed)
States since having a steroid injection her blood sugar has been above 200. Her MD told her to come to the ER for IV fluids.  ?

## 2021-09-30 ENCOUNTER — Other Ambulatory Visit: Payer: Self-pay

## 2021-09-30 ENCOUNTER — Encounter (HOSPITAL_COMMUNITY): Payer: Self-pay

## 2021-09-30 ENCOUNTER — Emergency Department (HOSPITAL_COMMUNITY)
Admission: EM | Admit: 2021-09-30 | Discharge: 2021-09-30 | Disposition: A | Discharge status: Home / Self Care | Payer: Medicare Other | Attending: Emergency Medicine | Admitting: Emergency Medicine

## 2021-09-30 DIAGNOSIS — R739 Hyperglycemia, unspecified: Secondary | ICD-10-CM

## 2021-09-30 DIAGNOSIS — Z7984 Long term (current) use of oral hypoglycemic drugs: Secondary | ICD-10-CM | POA: Diagnosis not present

## 2021-09-30 DIAGNOSIS — E1165 Type 2 diabetes mellitus with hyperglycemia: Secondary | ICD-10-CM | POA: Diagnosis not present

## 2021-09-30 DIAGNOSIS — Z7982 Long term (current) use of aspirin: Secondary | ICD-10-CM | POA: Diagnosis not present

## 2021-09-30 LAB — URINALYSIS, ROUTINE W REFLEX MICROSCOPIC
Bacteria, UA: NONE SEEN
Bilirubin Urine: NEGATIVE
Glucose, UA: >=500 mg/dL — AB
Hgb urine dipstick: NEGATIVE
Ketones, ur: 5 mg/dL — AB
Nitrite: NEGATIVE
Protein, ur: 30 mg/dL — AB
Specific Gravity, Urine: 1.025 (ref 1.005–1.030)
pH: 5 (ref 5.0–8.0)

## 2021-09-30 LAB — BASIC METABOLIC PANEL
Anion gap: 11 (ref 5–15)
BUN: 41 mg/dL — ABNORMAL HIGH (ref 8–23)
CO2: 17 mmol/L — ABNORMAL LOW (ref 22–32)
Calcium: 9.8 mg/dL (ref 8.9–10.3)
Chloride: 107 mmol/L (ref 98–111)
Creatinine, Ser: 1.12 mg/dL — ABNORMAL HIGH (ref 0.44–1.00)
GFR, Estimated: 50 mL/min — ABNORMAL LOW (ref >60)
Glucose, Bld: 282 mg/dL — ABNORMAL HIGH (ref 70–99)
Potassium: 5.1 mmol/L (ref 3.5–5.1)
Sodium: 135 mmol/L (ref 135–145)

## 2021-09-30 LAB — CBC WITH DIFFERENTIAL/PLATELET
Abs Immature Granulocytes: 0.01 10*3/uL (ref 0.00–0.07)
Basophils Absolute: 0 10*3/uL (ref 0.0–0.1)
Basophils Relative: 0 %
Eosinophils Absolute: 0 10*3/uL (ref 0.0–0.5)
Eosinophils Relative: 0 %
HCT: 37.4 % (ref 36.0–46.0)
Hemoglobin: 12 g/dL (ref 12.0–15.0)
Immature Granulocytes: 0 %
Lymphocytes Relative: 22 %
Lymphs Abs: 1.4 10*3/uL (ref 0.7–4.0)
MCH: 28.9 pg (ref 26.0–34.0)
MCHC: 32.1 g/dL (ref 30.0–36.0)
MCV: 90.1 fL (ref 80.0–100.0)
Monocytes Absolute: 0.6 10*3/uL (ref 0.1–1.0)
Monocytes Relative: 9 %
Neutro Abs: 4.4 10*3/uL (ref 1.7–7.7)
Neutrophils Relative %: 69 %
Platelets: 239 10*3/uL (ref 150–400)
RBC: 4.15 MIL/uL (ref 3.87–5.11)
RDW: 14.6 % (ref 11.5–15.5)
WBC: 6.4 10*3/uL (ref 4.0–10.5)
nRBC: 0 % (ref 0.0–0.2)

## 2021-09-30 LAB — BETA-HYDROXYBUTYRIC ACID: Beta-Hydroxybutyric Acid: 0.23 mmol/L (ref 0.05–0.27)

## 2021-09-30 LAB — CBG MONITORING, ED: Glucose-Capillary: 218 mg/dL — ABNORMAL HIGH (ref 70–99)

## 2021-09-30 MED ORDER — SODIUM CHLORIDE 0.9 % IV BOLUS
1000.0000 mL | Freq: Once | INTRAVENOUS | Status: AC
  Administered 2021-09-30: 1000 mL via INTRAVENOUS

## 2021-09-30 NOTE — ED Provider Triage Note (Signed)
Emergency Medicine Provider Triage Evaluation Note ? ?[name] , a 81 y.o. female  was evaluated in triage.  Pt complains of generalized weakness onset 1 week. Patient reports that she has had elevated blood sugars x 1 weeks.  Patient has steroid injection for her chronic back pain approximately 1 week ago.  She notes that she was not informed that the steroid injection will cause her blood sugar to elevate.  She notes that at home her blood sugars have been greater than 350. Spoke with PCP today and was informed to the come into the ED for further evaluation. Denies nausea, vomiting, dysuria, hematuria.  ?  ?Patient states she was at [facility] ED and was told she needed to come to the ED for IV fluids, but patient left and went to her PCP today prior to coming to this ED. ? ? ? ?Review of Systems  ?Positive: As per HPI above ?Negative:  ? ?Physical Exam  ?BP (!) 147/77 (BP Location: Right Arm)   Pulse 78   Temp (!) 97.5 ?F (36.4 ?C) (Oral)   Resp 17   Ht 5\' 9"  (1.753 m)   Wt 59.9 kg   SpO2 97%   BMI 19.50 kg/m?  ?Gen:   Awake, no distress   ?Resp:  Normal effort  ?MSK:   Moves extremities without difficulty  ?Other:   ? ?Medical Decision Making  ?Medically screening exam initiated at 12:44 PM.  Appropriate orders placed.  RANA HOCHSTEIN was informed that the remainder of the evaluation will be completed by another provider, this initial triage assessment does not replace that evaluation, and the importance of remaining in the ED until their evaluation is complete. ?  ?Latha Staunton A, PA-C ?09/30/21 1943 ? ?

## 2021-09-30 NOTE — ED Notes (Signed)
Did not have enough urine to get a culture ?

## 2021-09-30 NOTE — ED Provider Notes (Signed)
?[facility] DEPT ?Provider Note ? ? ?CSN: [id number] ?Arrival date & time: 09/30/21  1224 ? ?  ? ?History ? ?Chief Complaint  ?Patient presents with  ? Hyperglycemia  ? ? ?[name] is a 81 y.o. female. ? ?81 year old female with prior medical history as detailed below presents for evaluation.  Patient reports that she had a steroid injection approximately 1 week ago for treatment of chronic back pain. ? ?Subsequent to this injection she reports elevated blood sugars at home.  Sugars were typically in the 350+ range. ? ? ? ?Patient was seen yesterday at [facility] for same complaint. ? ?At that time she was given IV fluids and referred for admission.  She declined admission at that time and left AMA. ? ?She represents today complaining of persistent symptoms of dehydration.  She has followed up with her PCP earlier today and referred again to the ED for IV fluids. ? ?She denies associated fever or shortness of breath.  She denies other complaint. ? ?The history is provided by the patient and medical records.  ?Hyperglycemia ?Blood sugar level PTA:  350 ?Severity:  Moderate ?Onset quality:  Gradual ?Duration:  1 week ?Timing:  Rare ?Progression:  Waxing and waning ?Chronicity:  New ? ?  ? ?Home Medications ?Prior to Admission medications   ?Medication Sig Start Date End Date Taking? Authorizing Provider  ?aspirin EC 81 MG tablet Take 1 tablet (81 mg total) by mouth 2 (two) times daily after a meal. 11/15/20   Loni Dolly, PA-C  ?carvedilol (COREG) 25 MG tablet Take 25 mg by mouth 2 (two) times daily with a meal.    [provider]  ?glimepiride (AMARYL) 4 MG tablet Take 4 mg 2 (two) times daily by mouth.    [provider]  ?HYDROcodone-acetaminophen (NORCO/VICODIN) 5-325 MG tablet Take 1-2 tablets by mouth every 6 (six) hours as needed for moderate pain or severe pain (post op pain). 11/15/20   Loni Dolly, PA-C  ?levETIRAcetam (KEPPRA) 500 MG tablet  Take 500 mg by mouth in the morning and at bedtime. 10/24/20   [provider]  ?metFORMIN (GLUCOPHAGE) 500 MG tablet Take 500 mg by mouth in the morning, at noon, and at bedtime.    [provider]  ?pantoprazole (PROTONIX) 40 MG tablet Take 1 tablet (40 mg total) by mouth daily. Take 1 tab twice a day. ?Patient taking differently: Take 40 mg by mouth 2 (two) times daily. 06/02/17   Mary Sella, NP  ?rosuvastatin (CRESTOR) 5 MG tablet Take 5 mg by mouth in the morning and at bedtime. 10/24/20   [provider]  ?tiZANidine (ZANAFLEX) 4 MG tablet Take 1 tablet (4 mg total) by mouth every 6 (six) hours as needed for muscle spasms. 11/15/20 11/15/21  Loni Dolly, PA-C  ?   ? ?Allergies    ?Escitalopram oxalate, Byetta 5 mcg pen [exenatide], Codeine sulfate, Metoprolol tartrate, and Lipitor [atorvastatin]   ? ?Review of Systems   ?Review of Systems  ?All other systems reviewed and are negative. ? ?Physical Exam ?Updated Vital Signs ?BP (!) 178/100   Pulse 65   Temp (!) 97.5 ?F (36.4 ?C) (Oral)   Resp 18   Ht 5\' 9"  (1.753 m)   Wt 59.9 kg   SpO2 93%   BMI 19.50 kg/m?  ?Physical Exam ?Vitals and nursing note reviewed.  ?Constitutional:   ?   General: She is not in acute distress. ?   Appearance: Normal  appearance. She is well-developed.  ?HENT:  ?   Head: Normocephalic and atraumatic.  ?Eyes:  ?   Conjunctiva/sclera: Conjunctivae normal.  ?   Pupils: Pupils are equal, round, and reactive to light.  ?Cardiovascular:  ?   Rate and Rhythm: Normal rate and regular rhythm.  ?   Heart sounds: Normal heart sounds.  ?Pulmonary:  ?   Effort: Pulmonary effort is normal. No respiratory distress.  ?   Breath sounds: Normal breath sounds.  ?Abdominal:  ?   General: There is no distension.  ?   Palpations: Abdomen is soft.  ?   Tenderness: There is no abdominal tenderness.  ?Musculoskeletal:     ?   General: No deformity. Normal range of motion.  ?   Cervical back: Normal range of motion and neck  supple.  ?Skin: ?   General: Skin is warm and dry.  ?Neurological:  ?   General: No focal deficit present.  ?   Mental Status: She is alert and oriented to person, place, and time.  ? ? ?ED Results / Procedures / Treatments   ?Labs ?(all labs ordered are listed, but only abnormal results are displayed) ?Labs Reviewed  ?BASIC METABOLIC PANEL - Abnormal; Notable for the following components:  ?    Result Value  ? CO2 17 (*)   ? Glucose, Bld 282 (*)   ? BUN 41 (*)   ? Creatinine, Ser 1.12 (*)   ? GFR, Estimated 50 (*)   ? All other components within normal limits  ?URINALYSIS, ROUTINE W REFLEX MICROSCOPIC - Abnormal; Notable for the following components:  ? APPearance HAZY (*)   ? Glucose, UA >=500 (*)   ? Ketones, ur 5 (*)   ? Protein, ur 30 (*)   ? Leukocytes,Ua TRACE (*)   ? All other components within normal limits  ?CBG MONITORING, ED - Abnormal; Notable for the following components:  ? Glucose-Capillary 218 (*)   ? All other components within normal limits  ?CBC WITH DIFFERENTIAL/PLATELET  ?CBG MONITORING, ED  ? ? ?EKG ?None ? ?Radiology ?No results found. ? ?Procedures ?Procedures  ? ? ?Medications Ordered in ED ?Medications  ?sodium chloride 0.9 % bolus 1,000 mL (1,000 mLs Intravenous New Bag/Given 09/30/21 1615)  ? ? ?ED Course/ Medical Decision Making/ A&P ?  ?                        ?Medical Decision Making ? ? ?Medical Screen Complete ? ?This patient presented to the ED with complaint of hyperglycemia. ? ?This complaint involves an extensive number of treatment options. The initial differential diagnosis includes, but is not limited to, hyperglycemia, dehydration, metabolic abnormality, etc. ? ?This presentation is: Acute, Chronic, Self-Limited, Previously Undiagnosed, Uncertain Prognosis, Complicated, Systemic Symptoms, and Threat to Life/Bodily Function ? ?Patient with longstanding history of diabetes. ?Patient reports steroid injection approximately 1 week prior. ? ?Patient noted elevated blood sugars for  the several days after the steroid injection. ? ?Patient was seen yesterday at [facility] for treatment of same.  She was offered admission but declined. ? ?She returns today after seeing her PCP in the office.  She requests additional IV fluids. ? ?After administration of IV fluids the patient feels significant improved. ? ?Screening labs are without indication of need for admission. ? ?Patient to have her metformin dose increased by her PCP today. ?Patient does understand need for close follow-up.  Strict return precautions given understood. ? ?Co morbidities  that complicated the patient's evaluation ? ?Advanced age, diabetes ? ? ?Additional history obtained: ? ?External records from outside sources obtained and reviewed including prior ED visits and prior Inpatient records.  ? ? ?Lab Tests: ? ?I ordered and personally interpreted labs.  The pertinent results include: UA, CBC, BMP ? ?Cardiac Monitoring: ? ?The patient was maintained on a cardiac monitor.  I personally viewed and interpreted the cardiac monitor which showed an underlying rhythm of: NSR ? ? ?Medicines ordered: ? ?I ordered medication including IV fluids for possible dehydration ?Reevaluation of the patient after these medicines showed that the patient: improved ? ?Problem List / ED Course: ? ?Hyperglycemia, dehydration ? ? ?Reevaluation: ? ?After the interventions noted above, I reevaluated the patient and found that they have: improved ? ? ?Disposition: ? ?After consideration of the diagnostic results and the patients response to treatment, I feel that the patent would benefit from close outpatient follow-up.  ? ? ? ? ? ? ? ? ?Final Clinical Impression(s) / ED Diagnoses ?Final diagnoses:  ?Hyperglycemia  ? ? ?Rx / DC Orders ?ED Discharge Orders   ? ? None  ? ?  ? ? ?  ?Valarie Merino, MD ?09/30/21 1905 ? ?

## 2021-09-30 NOTE — ED Triage Notes (Addendum)
Patient reports that she has had elevated blood sugars x 1 weeks. ?CBG-218 in triage. ? ?Patient states she was at [facility] ED and was told she needed to come to the ED for IV fluids, but patient left and went to her PCP today prior to coming to this ED. ?

## 2021-09-30 NOTE — Discharge Instructions (Addendum)
Return for any problem.  ? ?Drink plenty of fluids. ?

## 2022-01-06 ENCOUNTER — Encounter (HOSPITAL_COMMUNITY): Payer: Self-pay

## 2022-01-06 ENCOUNTER — Observation Stay (HOSPITAL_COMMUNITY)
Admission: EM | Admit: 2022-01-06 | Discharge: 2022-01-07 | Disposition: A | Discharge status: Home / Self Care | Payer: Medicare Other | Attending: Internal Medicine | Admitting: Internal Medicine

## 2022-01-06 ENCOUNTER — Emergency Department (HOSPITAL_COMMUNITY): Payer: Medicare Other

## 2022-01-06 ENCOUNTER — Observation Stay (HOSPITAL_COMMUNITY): Payer: Medicare Other

## 2022-01-06 DIAGNOSIS — I10 Essential (primary) hypertension: Secondary | ICD-10-CM | POA: Diagnosis not present

## 2022-01-06 DIAGNOSIS — D649 Anemia, unspecified: Secondary | ICD-10-CM | POA: Diagnosis not present

## 2022-01-06 DIAGNOSIS — Z7902 Long term (current) use of antithrombotics/antiplatelets: Secondary | ICD-10-CM | POA: Insufficient documentation

## 2022-01-06 DIAGNOSIS — Z8673 Personal history of transient ischemic attack (TIA), and cerebral infarction without residual deficits: Secondary | ICD-10-CM | POA: Insufficient documentation

## 2022-01-06 DIAGNOSIS — Z955 Presence of coronary angioplasty implant and graft: Secondary | ICD-10-CM | POA: Insufficient documentation

## 2022-01-06 DIAGNOSIS — E119 Type 2 diabetes mellitus without complications: Secondary | ICD-10-CM | POA: Insufficient documentation

## 2022-01-06 DIAGNOSIS — Z7982 Long term (current) use of aspirin: Secondary | ICD-10-CM | POA: Diagnosis not present

## 2022-01-06 DIAGNOSIS — Z7984 Long term (current) use of oral hypoglycemic drugs: Secondary | ICD-10-CM | POA: Diagnosis not present

## 2022-01-06 DIAGNOSIS — Z96642 Presence of left artificial hip joint: Secondary | ICD-10-CM | POA: Insufficient documentation

## 2022-01-06 DIAGNOSIS — Z85828 Personal history of other malignant neoplasm of skin: Secondary | ICD-10-CM | POA: Insufficient documentation

## 2022-01-06 DIAGNOSIS — K219 Gastro-esophageal reflux disease without esophagitis: Secondary | ICD-10-CM | POA: Diagnosis present

## 2022-01-06 DIAGNOSIS — G459 Transient cerebral ischemic attack, unspecified: Principal | ICD-10-CM | POA: Insufficient documentation

## 2022-01-06 DIAGNOSIS — Z79899 Other long term (current) drug therapy: Secondary | ICD-10-CM | POA: Diagnosis not present

## 2022-01-06 DIAGNOSIS — I2510 Atherosclerotic heart disease of native coronary artery without angina pectoris: Secondary | ICD-10-CM | POA: Diagnosis not present

## 2022-01-06 DIAGNOSIS — R479 Unspecified speech disturbances: Secondary | ICD-10-CM | POA: Diagnosis present

## 2022-01-06 LAB — COMPREHENSIVE METABOLIC PANEL
ALT: 15 U/L (ref 0–44)
AST: 19 U/L (ref 15–41)
Albumin: 4 g/dL (ref 3.5–5.0)
Alkaline Phosphatase: 47 U/L (ref 38–126)
Anion gap: 13 (ref 5–15)
BUN: 28 mg/dL — ABNORMAL HIGH (ref 8–23)
CO2: 17 mmol/L — ABNORMAL LOW (ref 22–32)
Calcium: 9.7 mg/dL (ref 8.9–10.3)
Chloride: 104 mmol/L (ref 98–111)
Creatinine, Ser: 1.07 mg/dL — ABNORMAL HIGH (ref 0.44–1.00)
GFR, Estimated: 53 mL/min — ABNORMAL LOW (ref >60)
Glucose, Bld: 278 mg/dL — ABNORMAL HIGH (ref 70–99)
Potassium: 4.7 mmol/L (ref 3.5–5.1)
Sodium: 134 mmol/L — ABNORMAL LOW (ref 135–145)
Total Bilirubin: 0.9 mg/dL (ref 0.3–1.2)
Total Protein: 6.4 g/dL — ABNORMAL LOW (ref 6.5–8.1)

## 2022-01-06 LAB — APTT: aPTT: 28 seconds (ref 24–36)

## 2022-01-06 LAB — I-STAT CHEM 8, ED
BUN: 28 mg/dL — ABNORMAL HIGH (ref 8–23)
Calcium, Ion: 1.16 mmol/L (ref 1.15–1.40)
Chloride: 106 mmol/L (ref 98–111)
Creatinine, Ser: 0.9 mg/dL (ref 0.44–1.00)
Glucose, Bld: 280 mg/dL — ABNORMAL HIGH (ref 70–99)
HCT: 32 % — ABNORMAL LOW (ref 36.0–46.0)
Hemoglobin: 10.9 g/dL — ABNORMAL LOW (ref 12.0–15.0)
Potassium: 4.7 mmol/L (ref 3.5–5.1)
Sodium: 135 mmol/L (ref 135–145)
TCO2: 18 mmol/L — ABNORMAL LOW (ref 22–32)

## 2022-01-06 LAB — DIFFERENTIAL
Abs Immature Granulocytes: 0 10*3/uL (ref 0.00–0.07)
Basophils Absolute: 0 10*3/uL (ref 0.0–0.1)
Basophils Relative: 1 %
Eosinophils Absolute: 0.1 10*3/uL (ref 0.0–0.5)
Eosinophils Relative: 2 %
Immature Granulocytes: 0 %
Lymphocytes Relative: 29 %
Lymphs Abs: 1.4 10*3/uL (ref 0.7–4.0)
Monocytes Absolute: 0.5 10*3/uL (ref 0.1–1.0)
Monocytes Relative: 11 %
Neutro Abs: 2.7 10*3/uL (ref 1.7–7.7)
Neutrophils Relative %: 57 %

## 2022-01-06 LAB — CBG MONITORING, ED: Glucose-Capillary: 282 mg/dL — ABNORMAL HIGH (ref 70–99)

## 2022-01-06 LAB — CBC
HCT: 33.6 % — ABNORMAL LOW (ref 36.0–46.0)
Hemoglobin: 10.7 g/dL — ABNORMAL LOW (ref 12.0–15.0)
MCH: 29.4 pg (ref 26.0–34.0)
MCHC: 31.8 g/dL (ref 30.0–36.0)
MCV: 92.3 fL (ref 80.0–100.0)
Platelets: 210 10*3/uL (ref 150–400)
RBC: 3.64 MIL/uL — ABNORMAL LOW (ref 3.87–5.11)
RDW: 14.1 % (ref 11.5–15.5)
WBC: 4.7 10*3/uL (ref 4.0–10.5)
nRBC: 0 % (ref 0.0–0.2)

## 2022-01-06 LAB — ETHANOL: Alcohol, Ethyl (B): <10 mg/dL (ref <10)

## 2022-01-06 LAB — PROTIME-INR
INR: 1 (ref 0.8–1.2)
Prothrombin Time: 13 seconds (ref 11.4–15.2)

## 2022-01-06 LAB — GLUCOSE, CAPILLARY: Glucose-Capillary: 293 mg/dL — ABNORMAL HIGH (ref 70–99)

## 2022-01-06 MED ORDER — ALUM & MAG HYDROXIDE-SIMETH 200-200-20 MG/5ML PO SUSP
30.0000 mL | ORAL | Status: DC | PRN
  Administered 2022-01-06: 30 mL via ORAL
  Filled 2022-01-06: qty 30

## 2022-01-06 MED ORDER — INSULIN ASPART 100 UNIT/ML IJ SOLN
0.0000 [IU] | Freq: Three times a day (TID) | INTRAMUSCULAR | Status: DC
  Administered 2022-01-07: 2 [IU] via SUBCUTANEOUS
  Administered 2022-01-07: 5 [IU] via SUBCUTANEOUS
  Administered 2022-01-07: 2 [IU] via SUBCUTANEOUS

## 2022-01-06 MED ORDER — ACETAMINOPHEN 650 MG RE SUPP: 650.0000 mg | RECTAL | Status: DC | PRN

## 2022-01-06 MED ORDER — ASPIRIN 81 MG PO TBEC
81.0000 mg | DELAYED_RELEASE_TABLET | Freq: Two times a day (BID) | ORAL | Status: DC
  Administered 2022-01-07 (×2): 81 mg via ORAL
  Filled 2022-01-06 (×2): qty 1

## 2022-01-06 MED ORDER — SODIUM CHLORIDE 0.9% FLUSH: 3.0000 mL | Freq: Once | INTRAVENOUS | Status: DC

## 2022-01-06 MED ORDER — LATANOPROST 0.005 % OP SOLN
1.0000 [drp] | Freq: Every day | OPHTHALMIC | Status: DC
  Administered 2022-01-06: 1 [drp] via OPHTHALMIC
  Filled 2022-01-06: qty 2.5

## 2022-01-06 MED ORDER — HYDRALAZINE HCL 20 MG/ML IJ SOLN: 5.0000 mg | Freq: Three times a day (TID) | INTRAMUSCULAR | Status: DC | PRN

## 2022-01-06 MED ORDER — IOHEXOL 350 MG/ML SOLN
75.0000 mL | Freq: Once | INTRAVENOUS | Status: AC | PRN
  Administered 2022-01-06: 75 mL via INTRAVENOUS

## 2022-01-06 MED ORDER — PRAVASTATIN SODIUM 40 MG PO TABS
40.0000 mg | ORAL_TABLET | Freq: Every day | ORAL | Status: DC
  Administered 2022-01-06: 40 mg via ORAL
  Filled 2022-01-06: qty 1

## 2022-01-06 MED ORDER — STROKE: EARLY STAGES OF RECOVERY BOOK
Freq: Once | Status: AC
Start: 2022-01-07 — End: 2022-01-07
  Filled 2022-01-06: qty 1

## 2022-01-06 MED ORDER — SENNOSIDES-DOCUSATE SODIUM 8.6-50 MG PO TABS: 1.0000 | ORAL_TABLET | Freq: Every evening | ORAL | Status: DC | PRN

## 2022-01-06 MED ORDER — CLOPIDOGREL BISULFATE 75 MG PO TABS
75.0000 mg | ORAL_TABLET | Freq: Every day | ORAL | Status: DC
  Administered 2022-01-06: 75 mg via ORAL
  Filled 2022-01-06: qty 1

## 2022-01-06 MED ORDER — ENOXAPARIN SODIUM 40 MG/0.4ML IJ SOSY: 40.0000 mg | PREFILLED_SYRINGE | INTRAMUSCULAR | Status: DC

## 2022-01-06 MED ORDER — ACETAMINOPHEN 160 MG/5ML PO SOLN: 650.0000 mg | ORAL | Status: DC | PRN

## 2022-01-06 MED ORDER — SODIUM CHLORIDE 0.9 % IV SOLN: INTRAVENOUS | Status: AC

## 2022-01-06 MED ORDER — ACETAMINOPHEN 325 MG PO TABS: 650.0000 mg | ORAL_TABLET | ORAL | Status: DC | PRN

## 2022-01-06 MED ORDER — PANTOPRAZOLE SODIUM 40 MG PO TBEC
40.0000 mg | DELAYED_RELEASE_TABLET | Freq: Two times a day (BID) | ORAL | Status: DC
  Administered 2022-01-07 (×2): 40 mg via ORAL
  Filled 2022-01-06 (×2): qty 1

## 2022-01-06 NOTE — Assessment & Plan Note (Signed)
Continue protonix BID 

## 2022-01-06 NOTE — Assessment & Plan Note (Signed)
Stable Continue medical management with ASA (she has not been taking this), plavix, pravastatin and coreg after permissive HTN

## 2022-01-07 ENCOUNTER — Observation Stay (HOSPITAL_BASED_OUTPATIENT_CLINIC_OR_DEPARTMENT_OTHER): Payer: Medicare Other

## 2022-01-07 ENCOUNTER — Observation Stay (HOSPITAL_COMMUNITY): Payer: Medicare Other

## 2022-01-07 DIAGNOSIS — E7800 Pure hypercholesterolemia, unspecified: Secondary | ICD-10-CM

## 2022-01-07 DIAGNOSIS — E1159 Type 2 diabetes mellitus with other circulatory complications: Secondary | ICD-10-CM | POA: Diagnosis not present

## 2022-01-07 DIAGNOSIS — R29818 Other symptoms and signs involving the nervous system: Secondary | ICD-10-CM

## 2022-01-07 DIAGNOSIS — G459 Transient cerebral ischemic attack, unspecified: Secondary | ICD-10-CM | POA: Diagnosis not present

## 2022-01-07 DIAGNOSIS — I10 Essential (primary) hypertension: Secondary | ICD-10-CM

## 2022-01-07 DIAGNOSIS — R569 Unspecified convulsions: Secondary | ICD-10-CM

## 2022-01-07 LAB — ECHOCARDIOGRAM COMPLETE BUBBLE STUDY
AR max vel: 2.05 cm2
AV Peak grad: 5.5 mmHg
Ao pk vel: 1.17 m/s
Area-P 1/2: 3.83 cm2
Height: 69 in
P 1/2 time: 526 msec
S' Lateral: 2.1 cm
Weight: 2546.75 oz

## 2022-01-07 LAB — BASIC METABOLIC PANEL
Anion gap: 9 (ref 5–15)
BUN: 23 mg/dL (ref 8–23)
CO2: 21 mmol/L — ABNORMAL LOW (ref 22–32)
Calcium: 9.1 mg/dL (ref 8.9–10.3)
Chloride: 107 mmol/L (ref 98–111)
Creatinine, Ser: 0.84 mg/dL (ref 0.44–1.00)
GFR, Estimated: >60 mL/min (ref >60)
Glucose, Bld: 164 mg/dL — ABNORMAL HIGH (ref 70–99)
Potassium: 4 mmol/L (ref 3.5–5.1)
Sodium: 137 mmol/L (ref 135–145)

## 2022-01-07 LAB — GLUCOSE, CAPILLARY
Glucose-Capillary: 151 mg/dL — ABNORMAL HIGH (ref 70–99)
Glucose-Capillary: 170 mg/dL — ABNORMAL HIGH (ref 70–99)
Glucose-Capillary: 270 mg/dL — ABNORMAL HIGH (ref 70–99)

## 2022-01-07 LAB — VITAMIN B12: Vitamin B-12: 231 pg/mL (ref 180–914)

## 2022-01-07 LAB — LIPID PANEL
Cholesterol: 175 mg/dL (ref 0–200)
HDL: 55 mg/dL (ref >40)
LDL Cholesterol: 101 mg/dL — ABNORMAL HIGH (ref 0–99)
Total CHOL/HDL Ratio: 3.2 RATIO
Triglycerides: 97 mg/dL (ref <150)
VLDL: 19 mg/dL (ref 0–40)

## 2022-01-07 LAB — CBC
HCT: 31.2 % — ABNORMAL LOW (ref 36.0–46.0)
Hemoglobin: 10.5 g/dL — ABNORMAL LOW (ref 12.0–15.0)
MCH: 29.7 pg (ref 26.0–34.0)
MCHC: 33.7 g/dL (ref 30.0–36.0)
MCV: 88.1 fL (ref 80.0–100.0)
Platelets: 207 10*3/uL (ref 150–400)
RBC: 3.54 MIL/uL — ABNORMAL LOW (ref 3.87–5.11)
RDW: 13.9 % (ref 11.5–15.5)
WBC: 4 10*3/uL (ref 4.0–10.5)
nRBC: 0 % (ref 0.0–0.2)

## 2022-01-07 LAB — IRON AND TIBC
Iron: 60 ug/dL (ref 28–170)
Saturation Ratios: 14 % (ref 10.4–31.8)
TIBC: 434 ug/dL (ref 250–450)
UIBC: 374 ug/dL

## 2022-01-07 LAB — FERRITIN: Ferritin: 10 ng/mL — ABNORMAL LOW (ref 11–307)

## 2022-01-07 MED ORDER — ASPIRIN 325 MG PO TBEC: 325.0000 mg | DELAYED_RELEASE_TABLET | Freq: Every day | ORAL | 2 refills | Status: AC

## 2022-01-07 MED ORDER — ASPIRIN 325 MG PO TBEC: 325.0000 mg | DELAYED_RELEASE_TABLET | Freq: Every day | ORAL | Status: DC

## 2022-01-07 MED ORDER — LEVETIRACETAM 500 MG PO TABS: 500.0000 mg | ORAL_TABLET | Freq: Two times a day (BID) | ORAL | Status: DC

## 2022-01-07 MED ORDER — ROSUVASTATIN CALCIUM 40 MG PO TABS
40.0000 mg | ORAL_TABLET | Freq: Every day | ORAL | 2 refills | Status: AC
  Filled 2024-05-09: qty 90, 90d supply, fill #0

## 2022-01-07 MED ORDER — LEVETIRACETAM 500 MG PO TABS: 500.0000 mg | ORAL_TABLET | Freq: Two times a day (BID) | ORAL | 2 refills | Status: AC

## 2022-01-07 MED ORDER — ROSUVASTATIN CALCIUM 20 MG PO TABS
40.0000 mg | ORAL_TABLET | Freq: Every day | ORAL | Status: DC
  Administered 2022-01-07: 40 mg via ORAL
  Filled 2022-01-07: qty 2

## 2022-01-07 NOTE — TOC Transition Note (Signed)
Transition of Care [facility]) - CM/SW Discharge Note   Patient Details  Name: [name] MRN: [id number] Date of Birth: 10-Mar-1941  Transition of Care [facility]) CM/SW Contact:  Kermit Balo, RN Phone Number: 01/07/2022, 3:36 PM   Clinical Narrative:    Pt is discharging home with self care. Outpatient therapy recommended but she refused. She prefers to get re-enrolled in [employer].  Pt denies any issues with home medications or transportation.  Son will provide transport home.    Final next level of care: Home/Self Care Barriers to Discharge: No Barriers Identified   Patient Goals and CMS Choice        Discharge Placement                       Discharge Plan and Services                                     Social Determinants of Health (SDOH) Interventions     Readmission Risk Interventions     No data to display

## 2022-01-07 NOTE — Procedures (Signed)
Patient Name: [name]  MRN: [id number]  Epilepsy Attending: Charlsie Quest  Referring Physician/Provider: Marvel Plan, MD  Date: 01/07/2022 Duration: 24 mins  Patient history: 81 year old female presenting after an episode of transient aphasia and right facial droop. EEG to evaluate for seizure.  Level of alertness: Awake  AEDs during EEG study: None  Technical aspects: This EEG study was done with scalp electrodes positioned according to the 10-20 International system of electrode placement. Electrical activity was acquired at a sampling rate of 500Hz  and reviewed with a high frequency filter of 70Hz  and a low frequency filter of 1Hz . EEG data were recorded continuously and digitally stored.   Description: The posterior dominant rhythm consists of 9 Hz activity of moderate voltage (25-35 uV) seen predominantly in posterior head regions, symmetric and reactive to eye opening and eye closing. Hyperventilation and photic stimulation were not performed.     IMPRESSION: This study is within normal limits. No seizures or epileptiform discharges were seen throughout the recording.  [name] 

## 2022-01-07 NOTE — Progress Notes (Signed)
Physical Therapy Evaluation Patient Details Name: [name] MRN: [id number] DOB: October 16, 1940 Today's Date: 01/07/2022  History of Present Illness  pt is an 81 y/o female admitted to ED with complaints of speechdifficulty/dysarthria witness by son.  No acute brain abnormality.  Old lacunar infarct of R BG/ANT limb of the internal capsule noted.  PMHx: CM2, HTN, MI with stentingx3, stroke, tia, Lumbar fusion sx, L THA  Clinical Impression  Pt is at or close to baseline functioning and should be safe at home, but we did discuss that she is not mobilizing at an age-appropriate level, as to stability, gait speed and balance.  Completed stroke risk factors and BE FAST education.  Discussed the benefit of finding structured activity if no OPPT to address issues. There are no further acute PT needs.  Will sign off at this time.        Recommendations for follow up therapy are one component of a multi-disciplinary discharge planning process, led by the attending physician.  Recommendations may be updated based on patient status, additional functional criteria and insurance authorization.  Follow Up Recommendations Outpatient PT (OR at least use silver sneakers to find a structure activity)      Assistance Recommended at Discharge PRN  Patient can return home with the following       Equipment Recommendations None recommended by PT  Recommendations for Other Services       Functional Status Assessment Patient has had a recent decline in their functional status and demonstrates the ability to make significant improvements in function in a reasonable and predictable amount of time.     Precautions / Restrictions Precautions Precautions: Fall      Mobility  Bed Mobility Overal bed mobility: Modified Independent             General bed mobility comments: slow, needing extra time    Transfers Overall transfer level: Needs assistance Equipment used: None Transfers: Sit to/from  Stand, Bed to chair/wheelchair/BSC Sit to Stand: Supervision, Modified independent (Device/Increase time) Stand pivot transfers: Supervision, Modified independent (Device/Increase time)         General transfer comment: pt is guarded, moderate use of hands for stability    Ambulation/Gait Ambulation/Gait assistance: Supervision, Modified independent (Device/Increase time) Gait Distance (Feet): 400 Feet Assistive device: None (vs reaching for stationary surfaces) Gait Pattern/deviations: Step-through pattern Gait velocity: slower Gait velocity interpretation: <1.8 ft/sec, indicate of risk for recurrent falls   General Gait Details: guarded, mildly unsteady at times, reluctant to scan, turn abruptly, change speed.  episodes of deviate, but no LOB.  Stairs Stairs: Yes Stairs assistance: Min guard Stair Management: One rail Right, Step to pattern, Forwards Number of Stairs: 2 General stair comments: less stability coming down, cues for better sequencing due to weak knees/hip.  Wheelchair Mobility    Modified Rankin (Stroke Patients Only)       Balance Overall balance assessment: Needs assistance Sitting-balance support: No upper extremity supported, Feet supported Sitting balance-Leahy Scale: Good     Standing balance support: No upper extremity supported, During functional activity, Single extremity supported Standing balance-Leahy Scale: Fair Standing balance comment: guarded dynamically and statically, but able to manage balance without AD or external support                 Standardized Balance Assessment Standardized Balance Assessment : Berg Balance Test, Dynamic Gait Index Berg Balance Test Sit to Stand: Able to stand  independently using hands Standing Unsupported: Able to stand 2 minutes with  supervision Sitting with Back Unsupported but Feet Supported on Floor or Stool: Able to sit safely and securely 2 minutes Stand to Sit: Controls descent by using  hands Transfers: Able to transfer safely, minor use of hands Standing Unsupported with Eyes Closed: Able to stand 10 seconds with supervision Standing Ubsupported with Feet Together: Able to place feet together independently but unable to hold for 30 seconds From Standing Position, Pick up Object from Floor: Unable to pick up shoe, but reaches 2-5 cm (1-2") from shoe and balances independently From Standing Position, Turn to Look Behind Over each Shoulder: Looks behind one side only/other side shows less weight shift Turn 360 Degrees: Able to turn 360 degrees safely but slowly Dynamic Gait Index Level Surface: Mild Impairment Change in Gait Speed: Mild Impairment Gait with Horizontal Head Turns: Mild Impairment Gait with Vertical Head Turns: Normal Gait and Pivot Turn: Normal Step Over Obstacle: Mild Impairment Step Around Obstacles: Mild Impairment Steps: Mild Impairment Total Score: 18       Pertinent Vitals/Pain Pain Assessment Pain Assessment: Faces Pain Score: 2  Faces Pain Scale: Hurts a little bit Pain Location: hip/general Pain Intervention(s): Monitored during session    Home Living Family/patient expects to be discharged to:: Private residence Living Arrangements: Alone Available Help at Discharge: Family;Available PRN/intermittently Type of Home: House Home Access: Stairs to enter Entrance Stairs-Rails: None Entrance Stairs-Number of Steps: 1   Home Layout: One level Home Equipment: Rollator (4 wheels);Cane - single point      Prior Function Prior Level of Function : Independent/Modified Independent;Driving             Mobility Comments: pt has noted some slowing down/ ?guardedness       Hand Dominance        Extremity/Trunk Assessment   Upper Extremity Assessment Upper Extremity Assessment: Overall WFL for tasks assessed;Generalized weakness    Lower Extremity Assessment Lower Extremity Assessment: Overall WFL for tasks assessed;Generalized  weakness    Cervical / Trunk Assessment Cervical / Trunk Assessment: Other exceptions (past surgical back)  Communication   Communication: No difficulties  Cognition Arousal/Alertness: Awake/alert Behavior During Therapy: WFL for tasks assessed/performed Overall Cognitive Status: Within Functional Limits for tasks assessed                                          General Comments General comments (skin integrity, edema, etc.): Basied on limited BERG and completed DGI, pt does show a min to moderate risk for a fall, is tentative and apt to reach out to stationary surfaces at times to steady herself.  Suggested structured activity if not OPPT to improve balance/stability.  and demonstrated use of a cane if balance decreases any further.    Exercises     Assessment/Plan    PT Assessment All further PT needs can be met in the next venue of care  PT Problem List Decreased strength;Decreased balance;Decreased knowledge of use of DME       PT Treatment Interventions      PT Goals (Current goals can be found in the Care Plan section)  Acute Rehab PT Goals PT Goal Formulation: All assessment and education complete, DC therapy    Frequency       Co-evaluation               AM-PAC PT "6 Clicks" Mobility  Outcome Measure Help needed turning from  your back to your side while in a flat bed without using bedrails?: None Help needed moving from lying on your back to sitting on the side of a flat bed without using bedrails?: None Help needed moving to and from a bed to a chair (including a wheelchair)?: None Help needed standing up from a chair using your arms (e.g., wheelchair or bedside chair)?: None Help needed to walk in hospital room?: A Little Help needed climbing 3-5 steps with a railing? : A Little 6 Click Score: 22    End of Session   Activity Tolerance: Patient tolerated treatment well;Patient limited by fatigue Patient left: with call bell/phone  within reach;in chair Nurse Communication: Mobility status PT Visit Diagnosis: Other abnormalities of gait and mobility (R26.89)    Time: 1007-1050 PT Time Calculation (min) (ACUTE ONLY): 43 min   Charges:   PT Evaluation $PT Eval Moderate Complexity: 1 Mod PT Treatments $Gait Training: 8-22 mins $Therapeutic Activity: 8-22 mins        01/07/2022  Ginger Carne., PT Acute Rehabilitation Services [phone]  (pager) [phone]  (office)  Tessie Fass Marilyn Nihiser 01/07/2022, 11:14 AM

## 2022-01-07 NOTE — Discharge Summary (Signed)
Physician Discharge Summary  [name] VQM:[id number] DOB: 1941-05-03 DOA: 01/06/2022  PCP: Malka So., MD  Admit date: 01/06/2022 Discharge date: 01/07/2022  Admitted From: Home Disposition: Home  Recommendations for Outpatient Follow-up:  Follow up with PCP in 1-2 weeks Follow-up with vascular surgery at [facility] as scheduled Follow-up with neurology as previously established  Home Health: None Equipment/Devices: None  Discharge Condition: Stable CODE STATUS: Full Diet recommendation: Low-salt low-fat diet  Brief/Interim Summary: [name] is a 81 y.o. female with medical history significant of CAD s/p stenting in 2008, T2DM, GERD, HTN, HLD, questionable seizures and TIAs who presented to ED with complaints of speech difficulty/dysarthria that was witnessed by son.  Patient had multiple evaluations both at this facility and elsewhere with multiple episodes of slurred speech facial droop with tPA x2 with negative imaging subsequently.  Most recently evaluated at [facility] for potential seizures was previously on Keppra temporarily but then discontinued.  Patient presents again with episode of dysarthria and facial droop with negative imaging, per discussion with neurology given risk factors for both stroke and potential seizure we will treat patient for both, increase aspirin to 325 with Plavix for 3 months, add Keppra 500 twice daily as well as increase statin to 40 mg daily.  Patient otherwise stable and agreeable for discharge home given negative imaging resolution of symptoms and new medication regimen.   Discharge Diagnoses:  Principal Problem:   TIA (transient ischemic attack) Active Problems:   Diabetes (HCC)   Normocytic anemia   HTN (hypertension)   CAD s/p stents/HLD    Gastroesophageal reflux disease    Discharge Instructions  Discharge Instructions     Discharge patient   Complete by: As directed    Discharge disposition: 01-Home or Self  Care   Discharge patient date: 01/07/2022      Allergies as of 01/07/2022       Reactions   Corticosteroids Other (See Comments)   Severe hyperglycemia that lasted for weeks   Escitalopram Oxalate Other (See Comments)   Pt unsure of reaction   Codeine Sulfate Other (See Comments)   Unknown    Exenatide Nausea Only   Other reaction(s): Other (See Comments)   Metoprolol Tartrate Cough   Lipitor [atorvastatin] Palpitations, Other (See Comments)   "Felt like heart attack"        Medication List     STOP taking these medications    HYDROcodone-acetaminophen 5-325 MG tablet Commonly known as: NORCO/VICODIN   pravastatin 40 MG tablet Commonly known as: PRAVACHOL       TAKE these medications    acetaminophen 500 MG tablet Commonly known as: TYLENOL Take 500-1,500 mg by mouth See admin instructions. Take 1,500 mg by mouth at bedtime and an additional 500-1,000 mg once a day as needed for pain   aspirin EC 325 MG tablet Take 1 tablet (325 mg total) by mouth daily. Swallow whole. What changed:  medication strength how much to take when to take this additional instructions   carvedilol 25 MG tablet Commonly known as: COREG Take 25 mg by mouth 2 (two) times daily after a meal.   CINNAMON PO Take 1 capsule by mouth in the morning.   clopidogrel 75 MG tablet Commonly known as: PLAVIX Take 75 mg by mouth at bedtime.   glimepiride 4 MG tablet Commonly known as: AMARYL Take 4 mg 2 (two) times daily by mouth.   latanoprost 0.005 % ophthalmic solution Commonly known as: XALATAN Place 1 drop  into both eyes at bedtime.   levETIRAcetam 500 MG tablet Commonly known as: Keppra Take 1 tablet (500 mg total) by mouth 2 (two) times daily.   losartan 50 MG tablet Commonly known as: COZAAR Take 50 mg by mouth at bedtime.   metFORMIN 1000 MG tablet Commonly known as: GLUCOPHAGE Take 1,000 mg by mouth 2 (two) times daily with a meal.   pantoprazole 40 MG  tablet Commonly known as: PROTONIX Take 1 tablet (40 mg total) by mouth daily. Take 1 tab twice a day. What changed:  when to take this additional instructions   rosuvastatin 40 MG tablet Commonly known as: CRESTOR Take 1 tablet (40 mg total) by mouth daily. Start taking on: January 08, 2022        Allergies  Allergen Reactions   Corticosteroids Other (See Comments)    Severe hyperglycemia that lasted for weeks    Escitalopram Oxalate Other (See Comments)    Pt unsure of reaction   Codeine Sulfate Other (See Comments)    Unknown    Exenatide Nausea Only    Other reaction(s): Other (See Comments)   Metoprolol Tartrate Cough   Lipitor [Atorvastatin] Palpitations and Other (See Comments)    "Felt like heart attack"    Consultations: Neurology  Procedures/Studies: ECHOCARDIOGRAM COMPLETE BUBBLE STUDY  Result Date: 01/07/2022    ECHOCARDIOGRAM REPORT   Patient Name:   [name] [facility] Date of Exam: 01/07/2022 Medical Rec #:  [id number]        Height:       69.0 in Accession #:    [id number]       Weight:       159.2 lb Date of Birth:  12-May-1941        BSA:          1.875 m Patient Age:    81 years         BP:           134/77 mmHg Patient Gender: F                HR:           63 bpm. Exam Location:  Inpatient Procedure: 2D Echo, Cardiac Doppler, Color Doppler and Saline Contrast Bubble            Study Indications:    TIA  History:        Patient has prior history of Echocardiogram examinations, most                 recent 06/01/2017. CAD; Risk Factors:Hypertension and Diabetes.  Sonographer:    Eduard Roux Referring Phys: [id number] ALLISON WOLFE IMPRESSIONS  1. Left ventricular ejection fraction, by estimation, is 65 to 70%. The left ventricle has normal function. The left ventricle has no regional wall motion abnormalities. There is mild left ventricular hypertrophy. Left ventricular diastolic parameters are consistent with Grade I diastolic dysfunction (impaired relaxation).  2.  Right ventricular systolic function is normal. The right ventricular size is normal.  3. The mitral valve is normal in structure. No evidence of mitral valve regurgitation. No evidence of mitral stenosis.  4. The aortic valve is normal in structure. Aortic valve regurgitation is mild. No aortic stenosis is present.  5. The inferior vena cava is normal in size with greater than 50% respiratory variability, suggesting right atrial pressure of 3 mmHg. FINDINGS  Left Ventricle: Left ventricular ejection fraction, by estimation, is 65 to 70%. The left ventricle has normal function. The  left ventricle has no regional wall motion abnormalities. The left ventricular internal cavity size was normal in size. There is  mild left ventricular hypertrophy. Left ventricular diastolic parameters are consistent with Grade I diastolic dysfunction (impaired relaxation). Right Ventricle: The right ventricular size is normal. No increase in right ventricular wall thickness. Right ventricular systolic function is normal. Left Atrium: Left atrial size was normal in size. Right Atrium: Right atrial size was normal in size. Pericardium: There is no evidence of pericardial effusion. Mitral Valve: The mitral valve is normal in structure. Mild mitral annular calcification. No evidence of mitral valve regurgitation. No evidence of mitral valve stenosis. Tricuspid Valve: The tricuspid valve is normal in structure. Tricuspid valve regurgitation is not demonstrated. No evidence of tricuspid stenosis. Aortic Valve: The aortic valve is normal in structure. Aortic valve regurgitation is mild. Aortic regurgitation PHT measures 526 msec. No aortic stenosis is present. Aortic valve peak gradient measures 5.5 mmHg. Pulmonic Valve: The pulmonic valve was normal in structure. Pulmonic valve regurgitation is not visualized. No evidence of pulmonic stenosis. Aorta: The aortic root is normal in size and structure. Venous: The inferior vena cava is normal in  size with greater than 50% respiratory variability, suggesting right atrial pressure of 3 mmHg. IAS/Shunts: No atrial level shunt detected by color flow Doppler. Agitated saline contrast was given intravenously to evaluate for intracardiac shunting.  LEFT VENTRICLE PLAX 2D LVIDd:         4.10 cm   Diastology LVIDs:         2.10 cm   LV e' medial:    2.82 cm/s LV PW:         1.20 cm   LV E/e' medial:  19.8 LV IVS:        1.20 cm   LV e' lateral:   7.29 cm/s LVOT diam:     2.00 cm   LV E/e' lateral: 7.6 LV SV:         58 LV SV Index:   31 LVOT Area:     3.14 cm  RIGHT VENTRICLE            IVC RV Basal diam:  2.80 cm    IVC diam: 1.90 cm RV S prime:     9.34 cm/s TAPSE (M-mode): 1.9 cm LEFT ATRIUM             Index        RIGHT ATRIUM           Index LA diam:        4.00 cm 2.13 cm/m   RA Area:     13.40 cm LA Vol (A2C):   43.0 ml 22.93 ml/m  RA Volume:   30.10 ml  16.05 ml/m LA Vol (A4C):   42.5 ml 22.67 ml/m LA Biplane Vol: 45.0 ml 24.00 ml/m  AORTIC VALVE                 PULMONIC VALVE AV Area (Vmax): 2.05 cm     PV Vmax:       0.85 m/s AV Vmax:        117.00 cm/s  PV Peak grad:  2.9 mmHg AV Peak Grad:   5.5 mmHg LVOT Vmax:      76.50 cm/s LVOT Vmean:     47.600 cm/s LVOT VTI:       0.185 m AI PHT:         526 msec  AORTA Ao Root diam: 3.30 cm MITRAL  VALVE                TRICUSPID VALVE MV Area (PHT): 3.83 cm     TR Peak grad:   18.0 mmHg MV Decel Time: 198 msec     TR Vmax:        212.00 cm/s MV E velocity: 55.70 cm/s MV A velocity: 106.00 cm/s  SHUNTS MV E/A ratio:  0.53         Systemic VTI:  0.18 m                             Systemic Diam: 2.00 cm Donato Schultz MD Electronically signed by Donato Schultz MD Signature Date/Time: 01/07/2022/2:43:27 PM    Final    EEG adult  Result Date: 01/07/2022 Charlsie Quest, MD     01/07/2022 12:06 PM Patient Name: [name] MRN: [id number] Epilepsy Attending: Charlsie Quest Referring Physician/Provider: Marvel Plan, MD Date: 01/07/2022 Duration: 24 mins  Patient history: 81 year old female presenting after an episode of transient aphasia and right facial droop. EEG to evaluate for seizure. Level of alertness: Awake AEDs during EEG study: None Technical aspects: This EEG study was done with scalp electrodes positioned according to the 10-20 International system of electrode placement. Electrical activity was acquired at a sampling rate of 500Hz  and reviewed with a high frequency filter of 70Hz  and a low frequency filter of 1Hz . EEG data were recorded continuously and digitally stored. Description: The posterior dominant rhythm consists of 9 Hz activity of moderate voltage (25-35 uV) seen predominantly in posterior head regions, symmetric and reactive to eye opening and eye closing. Hyperventilation and photic stimulation were not performed.   IMPRESSION: This study is within normal limits. No seizures or epileptiform discharges were seen throughout the recording. Priyanka   CT ANGIO HEAD NECK W WO CM (CODE STROKE)  Result Date: 01/06/2022 CLINICAL DATA:  Provided history: Transient ischemic attack (TIA). EXAM: CT ANGIOGRAPHY HEAD AND NECK TECHNIQUE: Multidetector CT imaging of the head and neck was performed using the standard protocol during bolus administration of intravenous contrast. Multiplanar CT image reconstructions and MIPs were obtained to evaluate the vascular anatomy. Carotid stenosis measurements (when applicable) are obtained utilizing NASCET criteria, using the distal internal carotid diameter as the denominator. RADIATION DOSE REDUCTION: This exam was performed according to the departmental dose-optimization program which includes automated exposure control, adjustment of the mA and/or kV according to patient size and/or use of iterative reconstruction technique. CONTRAST:  35mL OMNIPAQUE IOHEXOL 350 MG/ML SOLN COMPARISON:  Brain MRI 01/06/2022. Head CT 01/06/2022. CT angiogram head/neck 05/31/2017. FINDINGS: CTA NECK FINDINGS Aortic arch:  Standard aortic branching. Atherosclerotic plaque within the visualized aortic arch and proximal major branch vessels of the neck. Streak and beam hardening artifact arising from a dense left-sided contrast bolus partially obscures the left subclavian artery. Within this limitation, there is no appreciable hemodynamically significant innominate or proximal subclavian artery stenosis. Right carotid system: CCA and ICA patent within the neck without stenosis. Minimal atherosclerotic plaque about the carotid bifurcation and within the proximal ICA. Left carotid system: CCA and ICA patent within the neck without stenosis. Minimal atherosclerotic plaque about the carotid bifurcation. Tortuosity of the cervical ICA. Vertebral arteries: Vertebral arteries codominant and patent within the neck. Atherosclerotic plaque at the origin of both vessels. Suspected severe stenosis at the origin of the right vertebral artery, progressed from the prior examination of 05/31/2017. Up  to moderate stenosis at the origin of the left vertebral artery, unchanged. Skeleton: Mass within the right parotid gland cervicothoracic levocurvature. Reversal of the expected cervical lordosis. Cervical spondylosis. No appreciable high-grade spinal canal stenosis. Multilevel bony neural foraminal narrowing. No acute bony abnormality or aggressive osseous lesion. Other neck: Subcentimeter thyroid nodules, not meeting consensus criteria for ultrasound follow-up based on size. Redemonstrated 13 mm cyst versus cystic neoplasm within the right parotid gland (series 7, image 152). Upper chest: No consolidation within the imaged lung apices. Review of the MIP images confirms the above findings CTA HEAD FINDINGS Anterior circulation: The intracranial internal carotid arteries are patent. Calcified plaque within both vessels. No more than mild stenosis on the right. Moderate stenosis of the paraclinoid/supraclinoid left ICA, progressed. The M1 middle cerebral  arteries are patent. Moderate/severe stenosis within the mid M1 right middle cerebral artery, new from the prior examination. Mild atherosclerotic irregularity of the M1 left MCA, unchanged. Atherosclerotic irregularity of the M2 and more distal MCA vessels, bilaterally. Most notably, there is a progressive severe stenosis within a superior division proximal to mid M2 left The anterior cerebral arteries are patent. No intracranial aneurysm is identified. MCA vessel (series 12, image 28). Posterior circulation: The intracranial vertebral arteries are patent. Calcified plaque within the V4 left vertebral artery with up to severe stenosis, unchanged. The basilar artery is patent. The posterior cerebral arteries are patent. Atherosclerotic irregularity of the left PCA. Most notably, there are sites of progressive severe stenosis within the left P2 segment. A right posterior communicating artery is present. The left posterior communicating artery is diminutive or absent. Venous sinuses: Within the limitations of contrast timing, no convincing thrombus. Anatomic variants: As described. Other: Known 10 mm partially calcified meningioma along the anterior falx. Review of the MIP images confirms the above findings IMPRESSION: CTA neck: 1. The common carotid and internal carotid arteries are patent within the neck without stenosis. Mild atherosclerotic plaque, bilaterally. 2. Vertebral arteries codominant and patent within the neck. Suspected severe atherosclerotic stenosis at the origin of the right vertebral artery, progressed from the prior CTA of 05/31/2017. Up to moderate atherosclerotic stenosis at the origin of the left vertebral artery, unchanged. 3. Redemonstrated 13 mm cyst versus cystic neoplasm within the right parotid gland. 4. Cervical spondylosis with nonspecific reversal of the expected cervical lordosis, and a cervicothoracic levocurvature. CTA head: 1. No intracranial large vessel occlusion is identified. 2.  Intracranial atherosclerotic disease with multifocal stenoses, most notably as follows. 3. Progressive moderate stenosis of the paraclinoid/supraclinoid left ICA. 4. New moderate/severe stenosis within the mid M1 right MCA. 5. Progressive severe stenosis within a mid to distal superior division left M2 MCA vessel. 6. Sites of up to severe atherosclerotic stenosis within the V4 left vertebral artery, unchanged. 7. Sites of progressive severe stenosis within the P2 left PCA. 8. Known 10 mm partially calcified meningioma along the anterior falx. Electronically Signed   By: Jackey Loge D.O.   On: 01/06/2022 20:37   MR BRAIN WO CONTRAST  Result Date: 01/06/2022 CLINICAL DATA:  Neurological deficit, acute, stroke suspected. EXAM: MRI HEAD WITHOUT CONTRAST TECHNIQUE: Multiplanar, multiecho pulse sequences of the brain and surrounding structures were obtained without intravenous contrast. COMPARISON:  Head CT same day FINDINGS: Brain: Diffusion imaging does not show any acute or subacute infarction. No focal abnormality affects the brainstem or cerebellum. Cerebral hemispheres show mild age related volume loss with mild chronic small-vessel ischemic change of the deep white matter and an old lacunar infarction in  the right basal ganglia/anterior limb internal capsule. No cortical or large vessel territory infarction. No mass lesion, hemorrhage, hydrocephalus or extra-axial collection. Mesial temporal lobes are symmetric and normal. Vascular: Major vessels at the base of the brain show flow. Skull and upper cervical spine: Negative Sinuses/Orbits: Clear/normal Other: 1.4 cm cyst or cystic tumor in the right parotid gland. This was present in November of 2019 and is only 2 mm larger, indicating that it is benign or very indolent. IMPRESSION: No acute brain finding. Chronic small-vessel ischemic changes of the white matter. Old lacunar infarction right basal ganglia/anterior limb internal capsule. Mesial temporal lobes  appear normal. 1.4 cm cyst or low-grade cystic tumor in the right parotid gland, only 2 mm larger than was seen in 2018, therefore benign or quite indolent. Electronically Signed   By: Paulina Fusi M.D.   On: 01/06/2022 16:43   CT HEAD CODE STROKE WO CONTRAST  Result Date: 01/06/2022 CLINICAL DATA:  Code stroke. EXAM: CT HEAD WITHOUT CONTRAST TECHNIQUE: Contiguous axial images were obtained from the base of the skull through the vertex without intravenous contrast. RADIATION DOSE REDUCTION: This exam was performed according to the departmental dose-optimization program which includes automated exposure control, adjustment of the mA and/or kV according to patient size and/or use of iterative reconstruction technique. COMPARISON:  CT head 10/13/20202, brain MRI 06/02/2017 FINDINGS: Brain: There is no evidence of acute territorial infarct. There is no acute intracranial hemorrhage or extra-axial fluid collection. There is background mild parenchymal volume loss with prominence of the ventricular system and extra-axial CSF spaces. There is a remote appearing lacunar infarct in the anterior limb of the right internal capsule. Patchy hypodensity in the subcortical and periventricular white matter likely reflects sequela of background chronic white matter microangiopathy. A small probable calcified meningioma overlying the midline frontal lobes is unchanged. There is no new mass lesion. There is no mass effect or midline shift. Vascular: There is calcification of the bilateral cavernous ICAs and vertebral arteries. No dense vessel is seen. Skull: Normal. Negative for fracture or focal lesion. Sinuses/Orbits: The imaged paranasal sinuses are clear. Bilateral lens implants are in place. The globes and orbits are otherwise unremarkable. Other: A 1.3 cm lesion in the right parotid gland is unchanged since 2018, likely benign. ASPECTS [facility] Stroke Program Early CT Score) - Ganglionic level infarction (caudate, lentiform  nuclei, internal capsule, insula, M1-M3 cortex): 7 - Supraganglionic infarction (M4-M6 cortex): 3 Total score (0-10 with 10 being normal): 10 IMPRESSION: 1. No acute intracranial hemorrhage or infarct. ASPECTS is 10. 2. Small remote appearing infarct in the right internal capsule, new since 2020. These results were communicated via AMION at the time of interpretation on 01/06/2022 at 1:29 pm to provider Dr Otelia Limes. Electronically Signed   By: Lesia Hausen M.D.   On: 01/06/2022 13:30     Subjective: No acute issues or events overnight, symptoms resolved   Discharge Exam: Vitals:   01/07/22 0713 01/07/22 1200  BP: 134/77 (!) 169/88  Pulse: 68 70  Resp: 16 16  Temp: 97.8 F (36.6 C) 98 F (36.7 C)  SpO2: 100% 100%   Vitals:   01/06/22 2354 01/07/22 0350 01/07/22 0713 01/07/22 1200  BP: (!) 143/61 135/80 134/77 (!) 169/88  Pulse: 72 70 68 70  Resp: Temp: 98 F (36.7 C) 98.6 F (37 C) 97.8 F (36.6 C) 98 F (36.7 C)  TempSrc:  Oral Oral Oral  SpO2: 100% 99% 100% 100%  Weight:  Height:        General: Pt is alert, awake, not in acute distress Cardiovascular: RRR, S1/S2 +, no rubs, no gallops Respiratory: CTA bilaterally, no wheezing, no rhonchi Abdominal: Soft, NT, ND, bowel sounds + Extremities: no edema, no cyanosis    The results of significant diagnostics from this hospitalization (including imaging, microbiology, ancillary and laboratory) are listed below for reference.     Microbiology: No results found for this or any previous visit (from the past 240 hour(s)).   Labs: BNP (last 3 results) No results for input(s): "BNP" in the last 8760 hours. Basic Metabolic Panel: Recent Labs  Lab 01/06/22 1315 01/06/22 1316 01/07/22 0327  NA 135 134* 137  K 4.7 4.7 4.0  CL 106 104 107  CO2  --  17* 21*  GLUCOSE 280* 278* 164*  BUN 28* 28* 23  CREATININE 0.90 1.07* 0.84  CALCIUM  --  9.7 9.1   Liver Function Tests: Recent Labs  Lab 01/06/22 1316   AST 19  ALT 15  ALKPHOS 47  BILITOT 0.9  PROT 6.4*  ALBUMIN 4.0   No results for input(s): "LIPASE", "AMYLASE" in the last 168 hours. No results for input(s): "AMMONIA" in the last 168 hours. CBC: Recent Labs  Lab 01/06/22 1315 01/06/22 1316 01/07/22 0327  WBC  --  4.7 4.0  NEUTROABS  --  2.7  --   HGB 10.9* 10.7* 10.5*  HCT 32.0* 33.6* 31.2*  MCV  --  92.3 88.1  PLT  --  210 207   Cardiac Enzymes: No results for input(s): "CKTOTAL", "CKMB", "CKMBINDEX", "TROPONINI" in the last 168 hours. BNP: Invalid input(s): "POCBNP" CBG: Recent Labs  Lab 01/06/22 1309 01/06/22 2119 01/07/22 0621 01/07/22 1202  GLUCAP 282* 293* 151* 170*   D-Dimer No results for input(s): "DDIMER" in the last 72 hours. Hgb A1c No results for input(s): "HGBA1C" in the last 72 hours. Lipid Profile Recent Labs    01/07/22 0327  CHOL 175  HDL 55  LDLCALC 101*  TRIG 97  CHOLHDL 3.2   Thyroid function studies No results for input(s): "TSH", "T4TOTAL", "T3FREE", "THYROIDAB" in the last 72 hours.  Invalid input(s): "FREET3" Anemia work up Recent Labs    01/07/22 0327  VITAMINB12 231  FERRITIN 10*  TIBC 434  IRON 60   Urinalysis    Component Value Date/Time   COLORURINE YELLOW 09/30/2021 1448   APPEARANCEUR HAZY (A) 09/30/2021 1448   LABSPEC 1.025 09/30/2021 1448   PHURINE 5.0 09/30/2021 1448   GLUCOSEU >=500 (A) 09/30/2021 1448   HGBUR NEGATIVE 09/30/2021 1448   BILIRUBINUR NEGATIVE 09/30/2021 1448   KETONESUR 5 (A) 09/30/2021 1448   PROTEINUR 30 (A) 09/30/2021 1448   NITRITE NEGATIVE 09/30/2021 1448   LEUKOCYTESUR TRACE (A) 09/30/2021 1448   Sepsis Labs Recent Labs  Lab 01/06/22 1316 01/07/22 0327  WBC 4.7 4.0   Microbiology No results found for this or any previous visit (from the past 240 hour(s)).   Time coordinating discharge: Over 30 minutes  SIGNED:   Azucena Fallen, DO [facility] 01/07/2022, 2:52 PM Pager   If 7PM-7AM, please contact  night-coverage [email or web address]

## 2022-01-07 NOTE — Plan of Care (Signed)

## 2022-01-07 NOTE — Progress Notes (Signed)
EEG complete - results pending 

## 2022-01-07 NOTE — Progress Notes (Addendum)
STROKE TEAM PROGRESS NOTE   INTERVAL HISTORY Patient is seen in her room with no family at the bedside.  Yesterday, she had an episode of acute onset garbled speech with right sided facial droop.  EMS was called, but her symptoms improved while she was en route to the hospital.  MRI was negative for acute stroke, and EEG was negative for seizure activity. She has had several episodes like this in the past, but the cause of these episodes has not been determined.  Vitals:   01/06/22 2354 01/07/22 0350 01/07/22 0713 01/07/22 1200  BP: (!) 143/61 135/80 134/77 (!) 169/88  Pulse: 72 70 68 70  Resp: 16 17 16 16   Temp: 98 F (36.7 C) 98.6 F (37 C) 97.8 F (36.6 C) 98 F (36.7 C)  TempSrc:  Oral Oral Oral  SpO2: 100% 99% 100% 100%  Weight:      Height:       CBC:  Recent Labs  Lab 01/06/22 1316 01/07/22 0327  WBC 4.7 4.0  NEUTROABS 2.7  --   HGB 10.7* 10.5*  HCT 33.6* 31.2*  MCV 92.3 88.1  PLT 210 [id number]   Basic Metabolic Panel:  Recent Labs  Lab 01/06/22 1316 01/07/22 0327  NA 134* 137  K 4.7 4.0  CL 104 107  CO2 17* 21*  GLUCOSE 278* 164*  BUN 28* 23  CREATININE 1.07* 0.84  CALCIUM 9.7 9.1   Lipid Panel:  Recent Labs  Lab 01/07/22 0327  CHOL 175  TRIG 97  HDL 55  CHOLHDL 3.2  VLDL 19  LDLCALC 101*   HgbA1c: No results for input(s): "HGBA1C" in the last 168 hours. Urine Drug Screen: No results for input(s): "LABOPIA", "COCAINSCRNUR", "LABBENZ", "AMPHETMU", "THCU", "LABBARB" in the last 168 hours.  Alcohol Level  Recent Labs  Lab 01/06/22 1316  ETH <10    IMAGING past 24 hours ECHOCARDIOGRAM COMPLETE BUBBLE STUDY  Result Date: 01/07/2022    ECHOCARDIOGRAM REPORT   Patient Name:   [name] [facility] Date of Exam: 01/07/2022 Medical Rec #:  [id number]        Height:       69.0 in Accession #:    [id number]       Weight:       159.2 lb Date of Birth:  1940-12-06        BSA:          1.875 m Patient Age:    81 years         BP:           134/77 mmHg Patient Gender:  F                HR:           63 bpm. Exam Location:  Inpatient Procedure: 2D Echo, Cardiac Doppler, Color Doppler and Saline Contrast Bubble            Study Indications:    TIA  History:        Patient has prior history of Echocardiogram examinations, most                 recent 06/01/2017. CAD; Risk Factors:Hypertension and Diabetes.  Sonographer:    Jefferey Pica Referring Phys: [id number] [place]  1. Left ventricular ejection fraction, by estimation, is 65 to 70%. The left ventricle has normal function. The left ventricle has no regional wall motion abnormalities. There is mild left ventricular hypertrophy. Left ventricular diastolic parameters are consistent  with Grade I diastolic dysfunction (impaired relaxation).  2. Right ventricular systolic function is normal. The right ventricular size is normal.  3. The mitral valve is normal in structure. No evidence of mitral valve regurgitation. No evidence of mitral stenosis.  4. The aortic valve is normal in structure. Aortic valve regurgitation is mild. No aortic stenosis is present.  5. The inferior vena cava is normal in size with greater than 50% respiratory variability, suggesting right atrial pressure of 3 mmHg. FINDINGS  Left Ventricle: Left ventricular ejection fraction, by estimation, is 65 to 70%. The left ventricle has normal function. The left ventricle has no regional wall motion abnormalities. The left ventricular internal cavity size was normal in size. There is  mild left ventricular hypertrophy. Left ventricular diastolic parameters are consistent with Grade I diastolic dysfunction (impaired relaxation). Right Ventricle: The right ventricular size is normal. No increase in right ventricular wall thickness. Right ventricular systolic function is normal. Left Atrium: Left atrial size was normal in size. Right Atrium: Right atrial size was normal in size. Pericardium: There is no evidence of pericardial effusion. Mitral Valve: The  mitral valve is normal in structure. Mild mitral annular calcification. No evidence of mitral valve regurgitation. No evidence of mitral valve stenosis. Tricuspid Valve: The tricuspid valve is normal in structure. Tricuspid valve regurgitation is not demonstrated. No evidence of tricuspid stenosis. Aortic Valve: The aortic valve is normal in structure. Aortic valve regurgitation is mild. Aortic regurgitation PHT measures 526 msec. No aortic stenosis is present. Aortic valve peak gradient measures 5.5 mmHg. Pulmonic Valve: The pulmonic valve was normal in structure. Pulmonic valve regurgitation is not visualized. No evidence of pulmonic stenosis. Aorta: The aortic root is normal in size and structure. Venous: The inferior vena cava is normal in size with greater than 50% respiratory variability, suggesting right atrial pressure of 3 mmHg. IAS/Shunts: No atrial level shunt detected by color flow Doppler. Agitated saline contrast was given intravenously to evaluate for intracardiac shunting.  LEFT VENTRICLE PLAX 2D LVIDd:         4.10 cm   Diastology LVIDs:         2.10 cm   LV e' medial:    2.82 cm/s LV PW:         1.20 cm   LV E/e' medial:  19.8 LV IVS:        1.20 cm   LV e' lateral:   7.29 cm/s LVOT diam:     2.00 cm   LV E/e' lateral: 7.6 LV SV:         58 LV SV Index:   31 LVOT Area:     3.14 cm  RIGHT VENTRICLE            IVC RV Basal diam:  2.80 cm    IVC diam: 1.90 cm RV S prime:     9.34 cm/s TAPSE (M-mode): 1.9 cm LEFT ATRIUM             Index        RIGHT ATRIUM           Index LA diam:        4.00 cm 2.13 cm/m   RA Area:     13.40 cm LA Vol (A2C):   43.0 ml 22.93 ml/m  RA Volume:   30.10 ml  16.05 ml/m LA Vol (A4C):   42.5 ml 22.67 ml/m LA Biplane Vol: 45.0 ml 24.00 ml/m  AORTIC VALVE  PULMONIC VALVE AV Area (Vmax): 2.05 cm     PV Vmax:       0.85 m/s AV Vmax:        117.00 cm/s  PV Peak grad:  2.9 mmHg AV Peak Grad:   5.5 mmHg LVOT Vmax:      76.50 cm/s LVOT Vmean:     47.600 cm/s  LVOT VTI:       0.185 m AI PHT:         526 msec  AORTA Ao Root diam: 3.30 cm MITRAL VALVE                TRICUSPID VALVE MV Area (PHT): 3.83 cm     TR Peak grad:   18.0 mmHg MV Decel Time: 198 msec     TR Vmax:        212.00 cm/s MV E velocity: 55.70 cm/s MV A velocity: 106.00 cm/s  SHUNTS MV E/A ratio:  0.53         Systemic VTI:  0.18 m                             Systemic Diam: 2.00 cm Candee Furbish MD Electronically signed by Candee Furbish MD Signature Date/Time: 01/07/2022/2:43:27 PM    Final    EEG adult  Result Date: 01/07/2022 Lora Havens, MD     01/07/2022 12:06 PM Patient Name: [name] MRN: [id number] Epilepsy Attending: Lora Havens Referring Physician/Provider: Rosalin Hawking, MD Date: 01/07/2022 Duration: 24 mins Patient history: 81 year old female presenting after an episode of transient aphasia and right facial droop. EEG to evaluate for seizure. Level of alertness: Awake AEDs during EEG study: None Technical aspects: This EEG study was done with scalp electrodes positioned according to the 10-20 International system of electrode placement. Electrical activity was acquired at a sampling rate of 500Hz  and reviewed with a high frequency filter of 70Hz  and a low frequency filter of 1Hz . EEG data were recorded continuously and digitally stored. Description: The posterior dominant rhythm consists of 9 Hz activity of moderate voltage (25-35 uV) seen predominantly in posterior head regions, symmetric and reactive to eye opening and eye closing. Hyperventilation and photic stimulation were not performed.   IMPRESSION: This study is within normal limits. No seizures or epileptiform discharges were seen throughout the recording. Priyanka Barbra Sarks   CT ANGIO HEAD NECK W WO CM (CODE STROKE)  Result Date: 01/06/2022 CLINICAL DATA:  Provided history: Transient ischemic attack (TIA). EXAM: CT ANGIOGRAPHY HEAD AND NECK TECHNIQUE: Multidetector CT imaging of the head and neck was performed using the  standard protocol during bolus administration of intravenous contrast. Multiplanar CT image reconstructions and MIPs were obtained to evaluate the vascular anatomy. Carotid stenosis measurements (when applicable) are obtained utilizing NASCET criteria, using the distal internal carotid diameter as the denominator. RADIATION DOSE REDUCTION: This exam was performed according to the departmental dose-optimization program which includes automated exposure control, adjustment of the mA and/or kV according to patient size and/or use of iterative reconstruction technique. CONTRAST:  85mL OMNIPAQUE IOHEXOL 350 MG/ML SOLN COMPARISON:  Brain MRI 01/06/2022. Head CT 01/06/2022. CT angiogram head/neck 05/31/2017. FINDINGS: CTA NECK FINDINGS Aortic arch: Standard aortic branching. Atherosclerotic plaque within the visualized aortic arch and proximal major branch vessels of the neck. Streak and beam hardening artifact arising from a dense left-sided contrast bolus partially obscures the left subclavian artery. Within this limitation, there is no appreciable  hemodynamically significant innominate or proximal subclavian artery stenosis. Right carotid system: CCA and ICA patent within the neck without stenosis. Minimal atherosclerotic plaque about the carotid bifurcation and within the proximal ICA. Left carotid system: CCA and ICA patent within the neck without stenosis. Minimal atherosclerotic plaque about the carotid bifurcation. Tortuosity of the cervical ICA. Vertebral arteries: Vertebral arteries codominant and patent within the neck. Atherosclerotic plaque at the origin of both vessels. Suspected severe stenosis at the origin of the right vertebral artery, progressed from the prior examination of 05/31/2017. Up to moderate stenosis at the origin of the left vertebral artery, unchanged. Skeleton: Mass within the right parotid gland cervicothoracic levocurvature. Reversal of the expected cervical lordosis. Cervical spondylosis.  No appreciable high-grade spinal canal stenosis. Multilevel bony neural foraminal narrowing. No acute bony abnormality or aggressive osseous lesion. Other neck: Subcentimeter thyroid nodules, not meeting consensus criteria for ultrasound follow-up based on size. Redemonstrated 13 mm cyst versus cystic neoplasm within the right parotid gland (series 7, image 152). Upper chest: No consolidation within the imaged lung apices. Review of the MIP images confirms the above findings CTA HEAD FINDINGS Anterior circulation: The intracranial internal carotid arteries are patent. Calcified plaque within both vessels. No more than mild stenosis on the right. Moderate stenosis of the paraclinoid/supraclinoid left ICA, progressed. The M1 middle cerebral arteries are patent. Moderate/severe stenosis within the mid M1 right middle cerebral artery, new from the prior examination. Mild atherosclerotic irregularity of the M1 left MCA, unchanged. Atherosclerotic irregularity of the M2 and more distal MCA vessels, bilaterally. Most notably, there is a progressive severe stenosis within a superior division proximal to mid M2 left The anterior cerebral arteries are patent. No intracranial aneurysm is identified. MCA vessel (series 12, image 28). Posterior circulation: The intracranial vertebral arteries are patent. Calcified plaque within the V4 left vertebral artery with up to severe stenosis, unchanged. The basilar artery is patent. The posterior cerebral arteries are patent. Atherosclerotic irregularity of the left PCA. Most notably, there are sites of progressive severe stenosis within the left P2 segment. A right posterior communicating artery is present. The left posterior communicating artery is diminutive or absent. Venous sinuses: Within the limitations of contrast timing, no convincing thrombus. Anatomic variants: As described. Other: Known 10 mm partially calcified meningioma along the anterior falx. Review of the MIP images  confirms the above findings IMPRESSION: CTA neck: 1. The common carotid and internal carotid arteries are patent within the neck without stenosis. Mild atherosclerotic plaque, bilaterally. 2. Vertebral arteries codominant and patent within the neck. Suspected severe atherosclerotic stenosis at the origin of the right vertebral artery, progressed from the prior CTA of 05/31/2017. Up to moderate atherosclerotic stenosis at the origin of the left vertebral artery, unchanged. 3. Redemonstrated 13 mm cyst versus cystic neoplasm within the right parotid gland. 4. Cervical spondylosis with nonspecific reversal of the expected cervical lordosis, and a cervicothoracic levocurvature. CTA head: 1. No intracranial large vessel occlusion is identified. 2. Intracranial atherosclerotic disease with multifocal stenoses, most notably as follows. 3. Progressive moderate stenosis of the paraclinoid/supraclinoid left ICA. 4. New moderate/severe stenosis within the mid M1 right MCA. 5. Progressive severe stenosis within a mid to distal superior division left M2 MCA vessel. 6. Sites of up to severe atherosclerotic stenosis within the V4 left vertebral artery, unchanged. 7. Sites of progressive severe stenosis within the P2 left PCA. 8. Known 10 mm partially calcified meningioma along the anterior falx. Electronically Signed   By: Jackey Loge D.O.  On: 01/06/2022 20:37   MR BRAIN WO CONTRAST  Result Date: 01/06/2022 CLINICAL DATA:  Neurological deficit, acute, stroke suspected. EXAM: MRI HEAD WITHOUT CONTRAST TECHNIQUE: Multiplanar, multiecho pulse sequences of the brain and surrounding structures were obtained without intravenous contrast. COMPARISON:  Head CT same day FINDINGS: Brain: Diffusion imaging does not show any acute or subacute infarction. No focal abnormality affects the brainstem or cerebellum. Cerebral hemispheres show mild age related volume loss with mild chronic small-vessel ischemic change of the deep white matter  and an old lacunar infarction in the right basal ganglia/anterior limb internal capsule. No cortical or large vessel territory infarction. No mass lesion, hemorrhage, hydrocephalus or extra-axial collection. Mesial temporal lobes are symmetric and normal. Vascular: Major vessels at the base of the brain show flow. Skull and upper cervical spine: Negative Sinuses/Orbits: Clear/normal Other: 1.4 cm cyst or cystic tumor in the right parotid gland. This was present in November of 2019 and is only 2 mm larger, indicating that it is benign or very indolent. IMPRESSION: No acute brain finding. Chronic small-vessel ischemic changes of the white matter. Old lacunar infarction right basal ganglia/anterior limb internal capsule. Mesial temporal lobes appear normal. 1.4 cm cyst or low-grade cystic tumor in the right parotid gland, only 2 mm larger than was seen in 2018, therefore benign or quite indolent. Electronically Signed   By: Nelson Chimes M.D.   On: 01/06/2022 16:43    PHYSICAL EXAM General:  Alert, well-developed, well-nourished elderly patient in no acute distress Respiratory:  Regular, unlabored respirations on room air  NEURO:  Mental Status: AA&Ox3  Speech/Language: speech is without dysarthria or aphasia.  Repetition, fluency, and comprehension intact.  Cranial Nerves:  II: PERRL. Visual fields full.  III, IV, VI: EOMI. Eyelids elevate symmetrically.  V: Sensation is intact to light touch and symmetrical to face.  VII: Smile is symmetrical.  VIII: hearing intact to voice. IX, X:Phonation is normal.  XII: tongue is midline without fasciculations. Motor: 5/5 strength to all muscle groups tested.  Tone: is normal and bulk is normal Sensation- Intact to light touch bilaterally.  Coordination: FTN intact bilaterally.No drift.  Gait- deferred   ASSESSMENT/PLAN [name] is a 81 y.o. female with history of chronic back pain, CAD s/p stenting, BPPV, DM, HLD, HTN, TIA and possible  seizures presenting with acute onset garbled speech with right sided facial droop.  EMS was called, but her symptoms improved while she was en route to the hospital.  MRI was negative for acute stroke, and EEG was negative for seizure activity. She has had several episodes like this in the past, but the cause of these episodes has not been determined.  TIA vs. partial seizure  Recurrent stereotypical symptoms with right facial droop and difficulty speaking. Code Stroke CT head No acute abnormality. chronic right BG/caudate lacunar infarct, seems new from previous exam.  ASPECTS 10.    CTA head & neck no LVO but multifocal intracranial stenosis including moderate stenosis of left Ica, moderate/severe stenosis of right M1 MCA, severe stenosis in left M2 MCA, severe stenosis of left V4, severe stenosis of left P2, right VA origin stenosis MRI  no acute abnormality, chronic small vessel ischemic changes 2D Echo EF [id number], grade 1 diastolic dysfunction, no atrial level shunt EEG normal LDL 101 HgbA1c 7.7 VTE prophylaxis - SCDs clopidogrel 75 mg daily prior to admission, now on aspirin 325 mg daily and clopidogrel 75 mg daily DAPT for 3 months and then Plavix alone.  Resume  Keppra 500 mg twice daily on discharge Therapy recommendations:  outpatient PT Disposition:  pending   Hx of TIA vs. Sz 05/2017 admitted for speech difficulty, status post tPA.  MRI no acute abnormality.  LDL 84, A1c 7.6.  Discharged with Plavix and pravastatin 80 10/2020 admitted in [facility] for right facial droop, difficulty speaking status post tPA.  MRI no acute abnormality.  EF 65 to 70%, LDL at 78, A1c 7.9.  Patient also reported several similar episodes in the past, considered simple partial seizure, put on Keppra 500 mg twice daily on discharge Per patient, Keppra was taken for a couple months and then discontinued on follow-up  Hypertension Home meds:  Coreg 25 mg BID, losartan 50 mg daily Stable Long-term BP goal  normotensive  Hyperlipidemia Home meds:  pravastatin 40 mg daily LDL 101, goal < 70 changed to rosuvastatin 40 mg daily Continue statin at discharge  Diabetes type II Uncontrolled Home meds:  glimepride 4 mg daily, metformin 1000 mg BID HgbA1c 7.7, goal < 7.0 CBGs SSI Recommend close PCP follow up for better control of diabetes  Other Stroke Risk Factors Advanced Age >/= 20  CAD status post stenting  Other Active Problems Fall in 2018 with residual intermittent soreness at the back of the head, denies migraine  Hospital day # 0  Cortney E Ernestina Columbia , MSN, AGACNP-BC [facility] See Amion for schedule and pager information 01/07/2022 2:56 PM  ATTENDING NOTE: I reviewed above note and agree with the assessment and plan. Pt was seen and examined.   81 year old female with history of CAD status post stenting, hypertension, hyperlipidemia, diabetes, history of TIA vs seizure admitted again with recurrent right facial droop and difficulty speaking.  Symptoms resolved on ED arrival, shorter than previous episodes.  CT no acute abnormality.  But chronic right BG/caudate lacunar infarct, seems new from previous exam.  MRI no acute abnormality.  CTA head and neck multifocal intracranial stenosis, progressed from previous exam.  EEG normal.  EF 65 to 70%, LDL 101, A1c 7.7.  Creatinine 0.84.  Hx of TIA vs. Sz 05/2017 admitted for speech difficulty, status post tPA.  MRI no acute abnormality.  LDL 84, A1c 7.6.  Discharged with Plavix and pravastatin 80 10/2020 admitted in [facility] for right facial droop, difficulty speaking status post tPA.  MRI no acute abnormality.  EF 65 to 70%, LDL at 78, A1c 7.9.  Patient also reported several similar episodes in the past, considered simple partial seizure, put on Keppra 500 mg twice daily on discharge Per patient, Keppra was taken for a couple months and then discontinued on follow-up  On exam, patient neurologically intact, no focal  deficit, awake alert oriented x3 no aphasia follows simple commands.  Etiology for patient recurrent stereotypical symptoms concerning for partial seizure, will resume Keppra 500 mg twice daily.  However, patient does have stroke risk factors, uncontrolled diabetes, worsening intracranial stenosis, chronic lacunar infarct on CT and MRI, TIA still in differential stenosis.  Will recommend aspirin 325 and Plavix 75 DAPT for 3 months and then Plavix alone.  Upgrade from pravastatin 40 to Crestor 40 for HLD management.  Aggressive stroke risk factor modification, PT/OT no recommendation.  For detailed assessment and plan, please refer to above/below as I have made changes wherever appropriate.   Neurology will sign off. Please call with questions. Pt will follow up with stroke clinic Dr. Pearlean Brownie at [facility] in about 4 weeks. Thanks for the consult.   Marvel Plan,  MD PhD Stroke Neurology 01/07/2022 6:43 PM      To contact Stroke Continuity provider, please refer to [email or web address] After hours, contact General Neurology

## 2022-01-08 LAB — HEMOGLOBIN A1C
Hgb A1c MFr Bld: 8.8 % — ABNORMAL HIGH (ref 4.8–5.6)
Mean Plasma Glucose: 206 mg/dL

## 2022-01-15 ENCOUNTER — Telehealth: Payer: Self-pay | Admitting: Neurology

## 2022-01-15 NOTE — Telephone Encounter (Signed)
LVM informing pt of need to reschedule 8/7 appointment - MD out

## 2022-02-16 ENCOUNTER — Inpatient Hospital Stay: Payer: Medicare Other | Admitting: Neurology

## 2022-03-03 ENCOUNTER — Inpatient Hospital Stay: Payer: Medicare Other | Admitting: Neurology

## 2022-06-15 ENCOUNTER — Other Ambulatory Visit: Payer: Self-pay | Admitting: Orthopaedic Surgery

## 2022-06-25 ENCOUNTER — Encounter (HOSPITAL_COMMUNITY): Payer: Self-pay

## 2022-06-25 ENCOUNTER — Encounter (HOSPITAL_COMMUNITY): Admission: RE | Admit: 2022-06-25 | Payer: Medicare Other | Source: Ambulatory Visit

## 2022-06-30 ENCOUNTER — Encounter (HOSPITAL_COMMUNITY): Admission: RE | Payer: Self-pay | Source: Ambulatory Visit

## 2022-06-30 ENCOUNTER — Ambulatory Visit (HOSPITAL_COMMUNITY): Admission: RE | Admit: 2022-06-30 | Payer: Medicare Other | Source: Ambulatory Visit | Admitting: Orthopaedic Surgery

## 2022-06-30 SURGERY — ARTHROPLASTY, HIP, TOTAL, ANTERIOR APPROACH
Anesthesia: Spinal | Site: Hip | Laterality: Right

## 2022-07-28 ENCOUNTER — Other Ambulatory Visit: Payer: Self-pay | Admitting: Orthopaedic Surgery

## 2022-09-03 ENCOUNTER — Encounter (HOSPITAL_COMMUNITY): Admission: RE | Admit: 2022-09-03 | Payer: Medicare Other | Source: Ambulatory Visit

## 2022-09-08 ENCOUNTER — Ambulatory Visit (HOSPITAL_COMMUNITY): Admission: RE | Admit: 2022-09-08 | Payer: Medicare Other | Source: Ambulatory Visit | Admitting: Orthopaedic Surgery

## 2022-09-08 ENCOUNTER — Encounter (HOSPITAL_COMMUNITY): Admission: RE | Payer: Self-pay | Source: Ambulatory Visit

## 2022-09-08 SURGERY — ARTHROPLASTY, HIP, TOTAL, ANTERIOR APPROACH
Anesthesia: Spinal | Site: Hip | Laterality: Right

## 2023-11-18 ENCOUNTER — Emergency Department (HOSPITAL_BASED_OUTPATIENT_CLINIC_OR_DEPARTMENT_OTHER)
Admission: EM | Admit: 2023-11-18 | Discharge: 2023-11-18 | Disposition: A | Discharge status: Home / Self Care | Attending: Emergency Medicine | Admitting: Emergency Medicine

## 2023-11-18 ENCOUNTER — Other Ambulatory Visit: Payer: Self-pay

## 2023-11-18 ENCOUNTER — Encounter (HOSPITAL_BASED_OUTPATIENT_CLINIC_OR_DEPARTMENT_OTHER): Payer: Self-pay

## 2023-11-18 DIAGNOSIS — Z7982 Long term (current) use of aspirin: Secondary | ICD-10-CM | POA: Diagnosis not present

## 2023-11-18 DIAGNOSIS — Z7901 Long term (current) use of anticoagulants: Secondary | ICD-10-CM | POA: Insufficient documentation

## 2023-11-18 DIAGNOSIS — K5900 Constipation, unspecified: Secondary | ICD-10-CM

## 2023-11-18 DIAGNOSIS — K5641 Fecal impaction: Secondary | ICD-10-CM | POA: Insufficient documentation

## 2023-11-18 LAB — COMPREHENSIVE METABOLIC PANEL WITH GFR
ALT: 10 U/L (ref 0–44)
AST: 20 U/L (ref 15–41)
Albumin: 4.5 g/dL (ref 3.5–5.0)
Alkaline Phosphatase: 60 U/L (ref 38–126)
Anion gap: 16 — ABNORMAL HIGH (ref 5–15)
BUN: 31 mg/dL — ABNORMAL HIGH (ref 8–23)
CO2: 19 mmol/L — ABNORMAL LOW (ref 22–32)
Calcium: 10.3 mg/dL (ref 8.9–10.3)
Chloride: 104 mmol/L (ref 98–111)
Creatinine, Ser: 1.65 mg/dL — ABNORMAL HIGH (ref 0.44–1.00)
GFR, Estimated: 31 mL/min — ABNORMAL LOW (ref >60)
Glucose, Bld: 177 mg/dL — ABNORMAL HIGH (ref 70–99)
Potassium: 4.3 mmol/L (ref 3.5–5.1)
Sodium: 139 mmol/L (ref 135–145)
Total Bilirubin: 0.7 mg/dL (ref 0.0–1.2)
Total Protein: 7.2 g/dL (ref 6.5–8.1)

## 2023-11-18 LAB — CBC WITH DIFFERENTIAL/PLATELET
Abs Immature Granulocytes: 0.01 10*3/uL (ref 0.00–0.07)
Basophils Absolute: 0 10*3/uL (ref 0.0–0.1)
Basophils Relative: 1 %
Eosinophils Absolute: 0.1 10*3/uL (ref 0.0–0.5)
Eosinophils Relative: 1 %
HCT: 36.1 % (ref 36.0–46.0)
Hemoglobin: 12.4 g/dL (ref 12.0–15.0)
Immature Granulocytes: 0 %
Lymphocytes Relative: 36 %
Lymphs Abs: 1.5 10*3/uL (ref 0.7–4.0)
MCH: 31.6 pg (ref 26.0–34.0)
MCHC: 34.3 g/dL (ref 30.0–36.0)
MCV: 91.9 fL (ref 80.0–100.0)
Monocytes Absolute: 0.5 10*3/uL (ref 0.1–1.0)
Monocytes Relative: 11 %
Neutro Abs: 2.1 10*3/uL (ref 1.7–7.7)
Neutrophils Relative %: 51 %
Platelets: 181 10*3/uL (ref 150–400)
RBC: 3.93 MIL/uL (ref 3.87–5.11)
RDW: 13.1 % (ref 11.5–15.5)
WBC: 4.2 10*3/uL (ref 4.0–10.5)
nRBC: 0 % (ref 0.0–0.2)

## 2023-11-18 LAB — LIPASE, BLOOD: Lipase: 49 U/L (ref 11–51)

## 2023-11-18 MED ORDER — SODIUM CHLORIDE 0.9 % IV BOLUS
1000.0000 mL | Freq: Once | INTRAVENOUS | Status: AC
  Administered 2023-11-18: 1000 mL via INTRAVENOUS

## 2023-11-18 NOTE — ED Notes (Signed)
 Second commode liner tossed out. Patient had a few small hard stools present.

## 2023-11-18 NOTE — Discharge Instructions (Signed)
 Please follow up with your primary care doctor within 2-3 days. For constipation we also recommend a diet high in fiber (beans, fruits, vegetables, whole grains).  You may also take MiraLAX  1-2 capfuls 1-2 times a day until stools become soft and then slowly decrease the amount of MiraLAX  used.  Maintain fluid intake 6-8 glasses per day.

## 2023-11-18 NOTE — ED Triage Notes (Signed)
 Pt BIB [facility] from home for constipation x 7 hours. Pt reports bilateral abdominal pain when she attempts to have a BM. Denies nausea.

## 2023-11-18 NOTE — ED Notes (Signed)
 There has been little progress in patient having a bowl movement post 400cc of soaps suds enema. A bit of liquid noted in commode. The remaining 400cc, soaps suds enema, were administered rectally at this time. Patient was instructed to hold onto solution as long as she can. Stool was noted at rectal opening.

## 2023-11-18 NOTE — ED Notes (Signed)
 Patient was able to pass a moderate amount of stool at this time.

## 2023-11-18 NOTE — ED Provider Notes (Signed)
 [place] [facility] Provider Note   CSN: [id number] Arrival date & time: 11/18/23  1947     History  Chief Complaint  Patient presents with   Constipation    [name] is a 83 y.o. female.  She is here with a couple of rectal pain that started earlier today.  She has been unable to pass stool.  She tried to manually disimpact herself without success.  No bleeding.  No significant abdominal pain although she did say she felt some burning earlier.  She has a history of some chronic constipation.  No fevers or chills.  The history is provided by the patient.  Constipation Time since last bowel movement:  1 day Timing:  Constant Progression:  Unchanged Relieved by:  Nothing Associated symptoms: no diarrhea, no dysuria, no fever, no hematochezia, no nausea and no vomiting        Home Medications Prior to Admission medications   Medication Sig Start Date End Date Taking? Authorizing Provider  acetaminophen  (TYLENOL ) 500 MG tablet Take 500-1,500 mg by mouth See admin instructions. Take 1,500 mg by mouth at bedtime and an additional 500-1,000 mg once a day as needed for pain    [provider]  aspirin  EC 325 MG tablet Take 1 tablet (325 mg total) by mouth daily. Swallow whole. 01/07/22   Haydee Lipa, MD  carvedilol  (COREG ) 25 MG tablet Take 25 mg by mouth 2 (two) times daily after a meal.    [provider]  CINNAMON  PO Take 1 capsule by mouth in the morning.    [provider]  clopidogrel  (PLAVIX ) 75 MG tablet Take 75 mg by mouth at bedtime. 11/21/21   [provider]  glimepiride  (AMARYL ) 4 MG tablet Take 4 mg 2 (two) times daily by mouth.    [provider]  latanoprost  (XALATAN ) 0.005 % ophthalmic solution Place 1 drop into both eyes at bedtime. 01/02/22   [provider]  levETIRAcetam  (KEPPRA ) 500 MG tablet Take 1 tablet (500 mg total) by mouth 2 (two) times daily. 01/07/22    Haydee Lipa, MD  losartan  (COZAAR ) 50 MG tablet Take 50 mg by mouth at bedtime. 12/23/21   [provider]  metFORMIN  (GLUCOPHAGE ) 1000 MG tablet Take 1,000 mg by mouth 2 (two) times daily with a meal.    [provider]  pantoprazole  (PROTONIX ) 40 MG tablet Take 1 tablet (40 mg total) by mouth daily. Take 1 tab twice a day. Patient taking differently: Take 40 mg by mouth in the morning and at bedtime. 06/02/17   Nichole Barker, NP  rosuvastatin  (CRESTOR ) 40 MG tablet Take 1 tablet (40 mg total) by mouth daily. 01/08/22   Haydee Lipa, MD      Allergies    Corticosteroids, Escitalopram oxalate, Codeine sulfate, Exenatide, Metoprolol tartrate, and Lipitor [atorvastatin]    Review of Systems   Review of Systems  Constitutional:  Negative for fever.  Gastrointestinal:  Positive for constipation. Negative for diarrhea, hematochezia, nausea and vomiting.  Genitourinary:  Negative for dysuria.    Physical Exam Updated Vital Signs BP (!) 185/98   Pulse 73   Temp 98.5 F (36.9 C) (Oral)   Resp 15   Ht 5\' 9"  (1.753 m)   Wt 64.9 kg   SpO2 100%   BMI 21.12 kg/m  Physical Exam Vitals and nursing note reviewed.  Constitutional:      General: She is not in acute distress.  Appearance: Normal appearance. She is well-developed.  HENT:     Head: Normocephalic and atraumatic.  Eyes:     Conjunctiva/sclera: Conjunctivae normal.  Cardiovascular:     Rate and Rhythm: Normal rate and regular rhythm.     Heart sounds: No murmur heard. Pulmonary:     Effort: Pulmonary effort is normal. No respiratory distress.     Breath sounds: Normal breath sounds. No stridor. No wheezing.  Abdominal:     Palpations: Abdomen is soft.     Tenderness: There is no abdominal tenderness. There is no guarding or rebound.  Genitourinary:    Comments: Rectal exam with ED tech as chaperone.  Significant hard stool in vault.  No other masses appreciated Musculoskeletal:         General: No deformity.     Cervical back: Neck supple.  Skin:    General: Skin is warm and dry.  Neurological:     General: No focal deficit present.     Mental Status: She is alert.     GCS: GCS eye subscore is 4. GCS verbal subscore is 5. GCS motor subscore is 6.     ED Results / Procedures / Treatments   Labs (all labs ordered are listed, but only abnormal results are displayed) Labs Reviewed  COMPREHENSIVE METABOLIC PANEL WITH GFR - Abnormal; Notable for the following components:      Result Value   CO2 19 (*)    Glucose, Bld 177 (*)    BUN 31 (*)    Creatinine, Ser 1.65 (*)    GFR, Estimated 31 (*)    Anion gap 16 (*)    All other components within normal limits  LIPASE, BLOOD  CBC WITH DIFFERENTIAL/PLATELET    EKG None  Radiology No results found.  Procedures .Fecal disimpaction  Date/Time: 11/18/2023 8:24 PM  Performed by: Tonya Fredrickson, MD Authorized by: Tonya Fredrickson, MD  Consent: Verbal consent obtained. Risks and benefits: risks, benefits and alternatives were discussed Consent given by: patient Patient understanding: patient states understanding of the procedure being performed Patient identity confirmed: verbally with patient Local anesthesia used: no  Anesthesia: Local anesthesia used: no  Sedation: Patient sedated: no  Patient tolerance: patient tolerated the procedure well with no immediate complications Comments: ED tech chaperone       Medications Ordered in ED Medications  sodium chloride  0.9 % bolus 1,000 mL (0 mLs Intravenous Stopped 11/18/23 2152)    ED Course/ Medical Decision Making/ A&P Clinical Course as of 11/19/23 1015  Thu Nov 18, 2023  2212 Patient had some good success after the soapsuds enema.  She is feeling improved.  She is comfortable plan for discharge and is calling her son for a ride. [MB]    Clinical Course User Index [MB] Tonya Fredrickson, MD                                 Medical Decision  Making Amount and/or Complexity of Data Reviewed Labs: ordered.   This patient complains of constipation and rectal pain; this involves an extensive number of treatment Options and is a complaint that carries with it a high risk of complications and morbidity. The differential includes constipation, hemorrhoid, fecal impaction, rectal mass  I ordered, reviewed and interpreted labs, which included CBC normal, chemistries with low bicarb elevated BUN and creatinine, elevated glucose I ordered medication IV fluids and reviewed PMP when indicated. Previous  records obtained and reviewed in epic no recent admissions Cardiac monitoring reviewed, sinus rhythm Social determinants considered, no significant barriers Critical Interventions: None  After the interventions stated above, I reevaluated the patient and found patient to be feeling much better after moving her bowels Admission and further testing considered, we will hold off on imaging and further workup at this time as she feels symptomatically improved.  Recommended close follow-up with PCP.  Return instructions discussed         Final Clinical Impression(s) / ED Diagnoses Final diagnoses:  Fecal impaction in rectum (HCC)  Constipation, unspecified constipation type    Rx / DC Orders ED Discharge Orders     None         Tonya Fredrickson, MD 11/19/23 1017

## 2024-05-04 ENCOUNTER — Other Ambulatory Visit: Payer: Self-pay

## 2024-05-06 ENCOUNTER — Other Ambulatory Visit (HOSPITAL_COMMUNITY): Payer: Self-pay

## 2024-05-09 ENCOUNTER — Other Ambulatory Visit: Payer: Self-pay

## 2024-05-09 ENCOUNTER — Other Ambulatory Visit (HOSPITAL_COMMUNITY): Payer: Self-pay

## 2024-05-09 MED ORDER — PANTOPRAZOLE SODIUM 40 MG PO TBEC: 40.0000 mg | DELAYED_RELEASE_TABLET | Freq: Two times a day (BID) | ORAL | 3 refills | Status: AC

## 2024-05-09 MED ORDER — FERROUS SULFATE 325 (65 FE) MG PO TABS: 325.0000 mg | ORAL_TABLET | Freq: Every day | ORAL | 2 refills | Status: AC

## 2024-05-09 MED ORDER — INSULIN DEGLUDEC 100 UNIT/ML ~~LOC~~ SOPN: 10.0000 [IU] | PEN_INJECTOR | Freq: Every day | SUBCUTANEOUS | 1 refills | Status: AC

## 2024-05-09 MED ORDER — GLIPIZIDE ER 10 MG PO TB24: 20.0000 mg | ORAL_TABLET | Freq: Every day | ORAL | 2 refills | Status: AC

## 2024-05-09 MED ORDER — LATANOPROST 0.005 % OP SOLN: 1.0000 [drp] | Freq: Every day | OPHTHALMIC | 3 refills | Status: AC

## 2024-05-09 MED ORDER — INSULIN DEGLUDEC 100 UNIT/ML ~~LOC~~ SOPN: 20.0000 [IU] | PEN_INJECTOR | Freq: Every day | SUBCUTANEOUS | 2 refills | Status: AC

## 2024-05-09 MED ORDER — FREESTYLE LIBRE 3 PLUS SENSOR MISC: 3 refills | Status: AC

## 2024-05-11 ENCOUNTER — Other Ambulatory Visit (HOSPITAL_COMMUNITY): Payer: Self-pay

## 2024-05-11 MED ORDER — TRULICITY 0.75 MG/0.5ML ~~LOC~~ SOAJ
0.7500 mg | SUBCUTANEOUS | 2 refills | Status: AC
  Filled 2024-05-19: qty 2, 28d supply, fill #0

## 2024-05-19 ENCOUNTER — Other Ambulatory Visit (HOSPITAL_COMMUNITY): Payer: Self-pay

## 2024-06-20 ENCOUNTER — Other Ambulatory Visit (HOSPITAL_COMMUNITY): Payer: Self-pay
